# Patient Record
Sex: Female | Born: 1990 | Race: Black or African American | Hispanic: No | Marital: Single | Smoking: Never smoker
Health system: Southern US, Community
[De-identification: ages and names within clinical notes are randomized; demographics above are authoritative.]

## PROBLEM LIST (undated history)

## (undated) DIAGNOSIS — M1990 Unspecified osteoarthritis, unspecified site: Secondary | ICD-10-CM

## (undated) HISTORY — PX: WISDOM TOOTH EXTRACTION: SHX21

## (undated) HISTORY — PX: KNEE ARTHROSCOPY: SHX127

## (undated) HISTORY — PX: LEG SURGERY: SHX1003

## (undated) HISTORY — PX: KNEE CARTILAGE SURGERY: SHX688

---

## 2014-02-13 HISTORY — PX: DILATION AND CURETTAGE OF UTERUS: SHX78

## 2018-03-05 DIAGNOSIS — Z01419 Encounter for gynecological examination (general) (routine) without abnormal findings: Secondary | ICD-10-CM | POA: Diagnosis not present

## 2018-03-05 DIAGNOSIS — Z3040 Encounter for surveillance of contraceptives, unspecified: Secondary | ICD-10-CM | POA: Diagnosis not present

## 2018-03-05 DIAGNOSIS — Z113 Encounter for screening for infections with a predominantly sexual mode of transmission: Secondary | ICD-10-CM | POA: Diagnosis not present

## 2018-03-05 DIAGNOSIS — Z124 Encounter for screening for malignant neoplasm of cervix: Secondary | ICD-10-CM | POA: Diagnosis not present

## 2018-03-12 DIAGNOSIS — A6000 Herpesviral infection of urogenital system, unspecified: Secondary | ICD-10-CM | POA: Diagnosis not present

## 2018-04-08 DIAGNOSIS — H6062 Unspecified chronic otitis externa, left ear: Secondary | ICD-10-CM | POA: Diagnosis not present

## 2018-04-08 DIAGNOSIS — H6122 Impacted cerumen, left ear: Secondary | ICD-10-CM | POA: Diagnosis not present

## 2018-05-14 ENCOUNTER — Ambulatory Visit (HOSPITAL_COMMUNITY)
Admission: EM | Admit: 2018-05-14 | Discharge: 2018-05-14 | Disposition: A | Discharge status: Home / Self Care | Payer: BLUE CROSS/BLUE SHIELD | Attending: Family Medicine | Admitting: Family Medicine

## 2018-05-14 ENCOUNTER — Ambulatory Visit (INDEPENDENT_AMBULATORY_CARE_PROVIDER_SITE_OTHER): Payer: BLUE CROSS/BLUE SHIELD

## 2018-05-14 ENCOUNTER — Encounter (HOSPITAL_COMMUNITY): Payer: Self-pay | Admitting: Emergency Medicine

## 2018-05-14 DIAGNOSIS — R062 Wheezing: Secondary | ICD-10-CM | POA: Diagnosis not present

## 2018-05-14 DIAGNOSIS — R0602 Shortness of breath: Secondary | ICD-10-CM

## 2018-05-14 DIAGNOSIS — J45909 Unspecified asthma, uncomplicated: Secondary | ICD-10-CM | POA: Diagnosis not present

## 2018-05-14 MED ORDER — ALBUTEROL SULFATE HFA 108 (90 BASE) MCG/ACT IN AERS
INHALATION_SPRAY | RESPIRATORY_TRACT | Status: AC
  Filled 2018-05-14: qty 6.7

## 2018-05-14 MED ORDER — AEROCHAMBER PLUS FLO-VU LARGE MISC
Status: AC
  Filled 2018-05-14: qty 1

## 2018-05-14 MED ORDER — ALBUTEROL SULFATE HFA 108 (90 BASE) MCG/ACT IN AERS
2.0000 | INHALATION_SPRAY | Freq: Once | RESPIRATORY_TRACT | Status: AC
  Administered 2018-05-14: 2 via RESPIRATORY_TRACT

## 2018-05-14 MED ORDER — ALBUTEROL SULFATE HFA 108 (90 BASE) MCG/ACT IN AERS: 1.0000 | INHALATION_SPRAY | Freq: Four times a day (QID) | RESPIRATORY_TRACT | 0 refills | Status: DC | PRN

## 2018-05-14 MED ORDER — CETIRIZINE HCL 10 MG PO TABS: 10.0000 mg | ORAL_TABLET | Freq: Every day | ORAL | 0 refills | Status: DC

## 2018-05-14 NOTE — Discharge Instructions (Addendum)
Drink plenty of fluids. Take an antihistamine like zyrtec daily during the allergy season Use the albuterol every 4 hours as needed for wheezing/shortness of breath Follow up as needed, if you fail to improve

## 2018-05-14 NOTE — ED Provider Notes (Signed)
[facility]    CSN: [id number] Arrival date & time: 05/14/18  1307     History   Chief Complaint Chief Complaint  Patient presents with  . Shortness of Breath    HPI Jodi Roberts is a 28 y.o. female.   HPI Patient is here for evaluation of shortness of breath. She has known seasonal allergies and these started a day or 2 ago.  Sneezing, runny nose, itchy watery eyes Last night she developed shortness of breath.  She feels like she cannot take a deep breath.  She has never had asthma before.  Asthma does run in her family. She has not had any fever or chills.  She is not had any cough.  She has not had any exposure to sick individuals.  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  She states she is working to keep a safe distance from customers. History reviewed. No pertinent past medical history.  There are no active problems to display for this patient.   Past Surgical History:  Procedure Laterality Date  . LEG SURGERY      OB History   No obstetric history on file.      Home Medications    Prior to Admission medications   Medication Sig Start Date End Date Taking? Authorizing Provider  albuterol (PROVENTIL HFA;VENTOLIN HFA) 108 (90 Base) MCG/ACT inhaler Inhale 1-2 puffs into the lungs every 6 (six) hours as needed for wheezing or shortness of breath. 05/14/18   Eustace Moore, MD  cetirizine (ZYRTEC) 10 MG tablet Take 1 tablet (10 mg total) by mouth daily. 05/14/18   Eustace Moore, MD    Family History Family History  Family history unknown: Yes  Family history of asthma per patient  Social History Social History   Tobacco Use  . Smoking status: Never Smoker  Substance Use Topics  . Alcohol use: Never    Frequency: Never  . Drug use: Never     Allergies   Shellfish allergy   Review of Systems Review of Systems  Constitutional: Negative for chills and fever.  HENT:  Positive for postnasal drip, rhinorrhea and sneezing. Negative for ear pain and sore throat.   Eyes: Negative for pain and visual disturbance.  Respiratory: Positive for shortness of breath. Negative for cough.   Cardiovascular: Negative for chest pain and palpitations.  Gastrointestinal: Negative for abdominal pain and vomiting.  Genitourinary: Negative for dysuria and hematuria.  Musculoskeletal: Negative for arthralgias and back pain.  Skin: Negative for color change and rash.  Neurological: Negative for seizures and syncope.  All other systems reviewed and are negative.    Physical Exam Triage Vital Signs ED Triage Vitals  Enc Vitals Group     BP 05/14/18 1328 (!) 155/95     Pulse Rate 05/14/18 1328 93     Resp 05/14/18 1328 (!) 30     Temp 05/14/18 1328 97.7 F (36.5 C)     Temp src --      SpO2 05/14/18 1328 100 %     Weight --      Height --      Head Circumference --      Peak Flow --      Pain Score 05/14/18 1329 0     Pain Loc --      Pain Edu? --      Excl. in GC? --    No data found.  Updated Vital Signs BP (!) 155/95   Pulse 93   Temp 97.7 F (36.5 C)   Resp (!) 30   LMP 05/13/2018   SpO2 100%      Physical Exam Constitutional:      General: She is not in acute distress.    Appearance: She is well-developed. She is obese.  HENT:     Head: Normocephalic and atraumatic.  Eyes:     Conjunctiva/sclera: Conjunctivae normal.     Pupils: Pupils are equal, round, and reactive to light.  Neck:     Musculoskeletal: Normal range of motion.  Cardiovascular:     Rate and Rhythm: Normal rate and regular rhythm.     Heart sounds: Normal heart sounds.  Pulmonary:     Effort: Pulmonary effort is normal. Tachypnea present. No respiratory distress.     Breath sounds: Examination of the right-upper field reveals wheezing. Examination of the left-upper field reveals wheezing. Examination of the right-middle field reveals wheezing. Examination of the left-middle  field reveals wheezing. Examination of the left-lower field reveals wheezing. Wheezing present.  Chest:     Chest wall: No tenderness.  Abdominal:     General: There is no distension.     Palpations: Abdomen is soft.  Musculoskeletal: Normal range of motion.     Right lower leg: She exhibits no tenderness. No edema.     Left lower leg: She exhibits no tenderness. No edema.  Lymphadenopathy:     Cervical: No cervical adenopathy.  Skin:    General: Skin is warm and dry.  Neurological:     General: No focal deficit present.     Mental Status: She is alert.  Psychiatric:        Mood and Affect: Mood normal.        Behavior: Behavior normal.   after albuterol inhaler the wheezing was resolved   UC Treatments / Results  Labs (all labs ordered are listed, but only abnormal results are displayed) Labs Reviewed - No data to display  EKG None  Radiology Dg Chest 2 View  Result Date: 05/14/2018 CLINICAL DATA:  Shortness of breath EXAM: CHEST - 2 VIEW COMPARISON:  None. FINDINGS: Heart and mediastinal contours are within normal limits. No focal opacities or effusions. No acute bony abnormality. IMPRESSION: No active cardiopulmonary disease. Electronically Signed   By: Charlett Nose M.D.   On: 05/14/2018 14:05    Procedures Procedures (including critical care time)  Medications Ordered in UC Medications  albuterol (PROVENTIL HFA;VENTOLIN HFA) 108 (90 Base) MCG/ACT inhaler 2 puff (2 puffs Inhalation Given 05/14/18 1353)    Initial Impression / Assessment and Plan / UC Course  I have reviewed the triage vital signs and the nursing notes.  Pertinent labs & imaging results that were available during my care of the patient were reviewed by me and considered in my medical decision making (see chart for details).     Discussed wheezing reaction to allergies/ diagnosis of asthma only if it recurrs Final Clinical Impressions(s) / UC Diagnoses   Final diagnoses:  Wheezing  Asthma due  to seasonal allergies     Discharge Instructions     Drink plenty of fluids. Take an antihistamine like zyrtec daily during the allergy season Use the albuterol every 4 hours as needed for wheezing/shortness of breath Follow up as needed, if you fail to improve    ED Prescriptions    Medication Sig Dispense Auth. Provider   albuterol (PROVENTIL HFA;VENTOLIN HFA) 108 (90 Base)  MCG/ACT inhaler Inhale 1-2 puffs into the lungs every 6 (six) hours as needed for wheezing or shortness of breath. 1 Inhaler Eustace Moore, MD   cetirizine (ZYRTEC) 10 MG tablet Take 1 tablet (10 mg total) by mouth daily. 30 tablet Eustace Moore, MD     Controlled Substance Prescriptions [place] Controlled Substance Registry consulted? Not Applicable   Eustace Moore, MD 05/14/18 [phone]

## 2018-05-14 NOTE — ED Triage Notes (Signed)
Pt c/o sob x2 days, states she has bad allergies, no hx of asthma personally but runs in her family, states its been making her feel sob.

## 2018-10-16 ENCOUNTER — Ambulatory Visit: Payer: Self-pay

## 2018-10-16 ENCOUNTER — Other Ambulatory Visit: Payer: Self-pay

## 2018-10-16 ENCOUNTER — Encounter: Payer: Self-pay | Admitting: Orthopaedic Surgery

## 2018-10-16 ENCOUNTER — Ambulatory Visit (INDEPENDENT_AMBULATORY_CARE_PROVIDER_SITE_OTHER): Payer: BC Managed Care – PPO | Admitting: Orthopaedic Surgery

## 2018-10-16 DIAGNOSIS — M25562 Pain in left knee: Secondary | ICD-10-CM | POA: Diagnosis not present

## 2018-10-16 DIAGNOSIS — M1712 Unilateral primary osteoarthritis, left knee: Secondary | ICD-10-CM | POA: Diagnosis not present

## 2018-10-16 DIAGNOSIS — G8929 Other chronic pain: Secondary | ICD-10-CM

## 2018-10-16 MED ORDER — METHYLPREDNISOLONE ACETATE 40 MG/ML IJ SUSP
40.0000 mg | INTRAMUSCULAR | Status: AC | PRN
  Administered 2018-10-16: 16:00:00 40 mg via INTRA_ARTICULAR

## 2018-10-16 MED ORDER — LIDOCAINE HCL 1 % IJ SOLN
2.0000 mL | INTRAMUSCULAR | Status: AC | PRN
  Administered 2018-10-16: 2 mL

## 2018-10-16 MED ORDER — BUPIVACAINE HCL 0.25 % IJ SOLN
2.0000 mL | INTRAMUSCULAR | Status: AC | PRN
  Administered 2018-10-16: 2 mL via INTRA_ARTICULAR
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
## 2018-10-24 ENCOUNTER — Telehealth: Payer: Self-pay | Admitting: Radiology

## 2018-10-24 DIAGNOSIS — M1712 Unilateral primary osteoarthritis, left knee: Secondary | ICD-10-CM | POA: Insufficient documentation

## 2018-10-24 NOTE — Telephone Encounter (Signed)
Printed info and put dx on OV note for [place] brace from [place]

## 2018-11-05 ENCOUNTER — Other Ambulatory Visit: Payer: Self-pay

## 2018-11-05 ENCOUNTER — Ambulatory Visit (INDEPENDENT_AMBULATORY_CARE_PROVIDER_SITE_OTHER): Payer: BC Managed Care – PPO | Admitting: Orthopaedic Surgery

## 2018-11-05 DIAGNOSIS — M1732 Unilateral post-traumatic osteoarthritis, left knee: Secondary | ICD-10-CM
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
## 2018-11-06 DIAGNOSIS — N760 Acute vaginitis: Secondary | ICD-10-CM | POA: Diagnosis not present

## 2018-11-06 DIAGNOSIS — Z113 Encounter for screening for infections with a predominantly sexual mode of transmission: Secondary | ICD-10-CM | POA: Diagnosis not present

## 2018-11-06 DIAGNOSIS — Z6836 Body mass index (BMI) 36.0-36.9, adult: Secondary | ICD-10-CM | POA: Diagnosis not present

## 2018-11-06 DIAGNOSIS — Z01419 Encounter for gynecological examination (general) (routine) without abnormal findings: Secondary | ICD-10-CM | POA: Diagnosis not present

## 2018-11-06 LAB — HM PAP SMEAR: HM Pap smear: NEGATIVE

## 2018-11-28 ENCOUNTER — Other Ambulatory Visit: Payer: Self-pay

## 2018-12-02 NOTE — Progress Notes (Signed)
[id number] [place], [place] - [address] [place] [address] Phone: [phone] Fax: [phone]      Your procedure is scheduled on October 26  Report to [facility] Main Entrance "A" at 0530 A.M., and check in at the Admitting office.  Call this number if you have problems the morning of surgery:  [phone]  Call [phone] if you have any questions prior to your surgery date Monday-Friday 8am-4pm    Remember:  Do not eat  after midnight the night before your surgery  You may drink clear liquids until 0430 am  the morning of your surgery.   Clear liquids allowed are: Water, Non-Citrus Juices (without pulp), Carbonated Beverages, Clear Tea, Black Coffee Only, and Gatorade   Enhanced Recovery after Surgery for Orthopedics Enhanced Recovery after Surgery is a protocol used to improve the stress on your body and your recovery after surgery.  Patient Instructions  . The night before surgery:  o No food after midnight. ONLY clear liquids after midnight  .  Marland Kitchen The day of surgery (if you do NOT have diabetes):  o Drink ONE (1) Pre-Surgery Clear Ensure as directed.   o This drink was given to you during your hospital  pre-op appointment visit. o The pre-op nurse will instruct you on the time to drink the  Pre-Surgery Ensure depending on your surgery time. Drink by 0430 am the morning of your surgery o Finish the drink at the designated time by the pre-op nurse.  o Nothing else to drink after completing the  Pre-Surgery Clear Ensure.         If you have questions, please contact your surgeon's office.      Take these medicines the morning of surgery with A SIP OF WATER  cetirizine (ZYRTEC) albuterol (PROVENTIL HFA;VENTOLIN HFA) if needed, Please bring all inhalers with you the day of surgery.   7 days prior to surgery STOP taking any Aspirin (unless otherwise instructed by your surgeon), Aleve, Naproxen, Ibuprofen, Motrin, Advil,  Goody's, BC's, all herbal medications, fish oil, and all vitamins.    The Morning of Surgery  Do not wear jewelry, make-up or nail polish.  Do not wear lotions, powders, or perfumes/colognes, or deodorant  Do not shave 48 hours prior to surgery.    Do not bring valuables to the hospital.  [facility] is not responsible for any belongings or valuables.  If you are a smoker, DO NOT Smoke 24 hours prior to surgery IF you wear a CPAP at night please bring your mask, tubing, and machine the morning of surgery   Remember that you must have someone to transport you home after your surgery, and remain with you for 24 hours if you are discharged the same day.   Contacts, glasses, hearing aids, dentures or bridgework may not be worn into surgery.    Leave your suitcase in the car.  After surgery it may be brought to your room.  For patients admitted to the hospital, discharge time will be determined by your treatment team.  Patients discharged the day of surgery will not be allowed to drive home.    Special instructions:   [facility]- Preparing For Surgery  Before surgery, you can play an important role. Because skin is not sterile, your skin needs to be as free of germs as possible. You can reduce the number of germs on your skin by washing with CHG (chlorahexidine gluconate) Soap before surgery.  CHG  is an antiseptic cleaner which kills germs and bonds with the skin to continue killing germs even after washing.    Oral Hygiene is also important to reduce your risk of infection.  Remember - BRUSH YOUR TEETH THE MORNING OF SURGERY WITH YOUR REGULAR TOOTHPASTE  Please do not use if you have an allergy to CHG or antibacterial soaps. If your skin becomes reddened/irritated stop using the CHG.  Do not shave (including legs and underarms) for at least 48 hours prior to first CHG shower. It is OK to shave your face.  Please follow these instructions carefully.   1. Shower the NIGHT BEFORE  SURGERY and the MORNING OF SURGERY with CHG Soap.   2. If you chose to wash your hair, wash your hair first as usual with your normal shampoo.  3. After you shampoo, rinse your hair and body thoroughly to remove the shampoo.  4. Use CHG as you would any other liquid soap. You can apply CHG directly to the skin and wash gently with a scrungie or a clean washcloth.   5. Apply the CHG Soap to your body ONLY FROM THE NECK DOWN.  Do not use on open wounds or open sores. Avoid contact with your eyes, ears, mouth and genitals (private parts). Wash Face and genitals (private parts)  with your normal soap.   6. Wash thoroughly, paying special attention to the area where your surgery will be performed.  7. Thoroughly rinse your body with warm water from the neck down.  8. DO NOT shower/wash with your normal soap after using and rinsing off the CHG Soap.  9. Pat yourself dry with a CLEAN TOWEL.  10. Wear CLEAN PAJAMAS to bed the night before surgery, wear comfortable clothes the morning of surgery  11. Place CLEAN SHEETS on your bed the night of your first shower and DO NOT SLEEP WITH PETS.    Day of Surgery:  Do not apply any deodorants/lotions. Please shower the morning of surgery with the CHG soap  Please wear clean clothes to the hospital/surgery center.   Remember to brush your teeth WITH YOUR REGULAR TOOTHPASTE.   Please read over the following fact sheets that you were given.

## 2018-12-03 ENCOUNTER — Other Ambulatory Visit: Payer: Self-pay

## 2018-12-03 ENCOUNTER — Encounter (HOSPITAL_COMMUNITY)
Admission: RE | Admit: 2018-12-03 | Discharge: 2018-12-03 | Disposition: A | Discharge status: Home / Self Care | Payer: BC Managed Care – PPO | Source: Ambulatory Visit | Attending: Orthopaedic Surgery | Admitting: Orthopaedic Surgery

## 2018-12-03 ENCOUNTER — Encounter (HOSPITAL_COMMUNITY): Payer: Self-pay

## 2018-12-03 ENCOUNTER — Other Ambulatory Visit: Payer: Self-pay | Admitting: Physician Assistant

## 2018-12-03 ENCOUNTER — Ambulatory Visit (HOSPITAL_COMMUNITY)
Admission: RE | Admit: 2018-12-03 | Discharge: 2018-12-03 | Disposition: A | Discharge status: Home / Self Care | Payer: BC Managed Care – PPO | Source: Ambulatory Visit | Attending: Physician Assistant | Admitting: Physician Assistant

## 2018-12-03 DIAGNOSIS — M1712 Unilateral primary osteoarthritis, left knee: Secondary | ICD-10-CM

## 2018-12-03 DIAGNOSIS — Z01818 Encounter for other preprocedural examination: Secondary | ICD-10-CM | POA: Diagnosis not present

## 2018-12-03 HISTORY — DX: Unspecified osteoarthritis, unspecified site: M19.90

## 2018-12-03 LAB — COMPREHENSIVE METABOLIC PANEL
ALT: 18 U/L (ref 0–44)
AST: 20 U/L (ref 15–41)
Albumin: 3.7 g/dL (ref 3.5–5.0)
Alkaline Phosphatase: 73 U/L (ref 38–126)
Anion gap: 10 (ref 5–15)
BUN: 13 mg/dL (ref 6–20)
CO2: 22 mmol/L (ref 22–32)
Calcium: 8.9 mg/dL (ref 8.9–10.3)
Chloride: 107 mmol/L (ref 98–111)
Creatinine, Ser: 0.73 mg/dL (ref 0.44–1.00)
GFR calc Af Amer: >60 mL/min (ref >60)
GFR calc non Af Amer: >60 mL/min (ref >60)
Glucose, Bld: 99 mg/dL (ref 70–99)
Potassium: 3.3 mmol/L — ABNORMAL LOW (ref 3.5–5.1)
Sodium: 139 mmol/L (ref 135–145)
Total Bilirubin: 0.4 mg/dL (ref 0.3–1.2)
Total Protein: 8.4 g/dL — ABNORMAL HIGH (ref 6.5–8.1)

## 2018-12-03 LAB — TYPE AND SCREEN
ABO/RH(D): O POS
Antibody Screen: NEGATIVE

## 2018-12-03 LAB — CBC WITH DIFFERENTIAL/PLATELET
Abs Immature Granulocytes: 0.03 10*3/uL (ref 0.00–0.07)
Basophils Absolute: 0 10*3/uL (ref 0.0–0.1)
Basophils Relative: 1 %
Eosinophils Absolute: 0.1 10*3/uL (ref 0.0–0.5)
Eosinophils Relative: 2 %
HCT: 36.1 % (ref 36.0–46.0)
Hemoglobin: 11.1 g/dL — ABNORMAL LOW (ref 12.0–15.0)
Immature Granulocytes: 0 %
Lymphocytes Relative: 28 %
Lymphs Abs: 2 10*3/uL (ref 0.7–4.0)
MCH: 28.8 pg (ref 26.0–34.0)
MCHC: 30.7 g/dL (ref 30.0–36.0)
MCV: 93.5 fL (ref 80.0–100.0)
Monocytes Absolute: 0.5 10*3/uL (ref 0.1–1.0)
Monocytes Relative: 8 %
Neutro Abs: 4.3 10*3/uL (ref 1.7–7.7)
Neutrophils Relative %: 61 %
Platelets: 274 10*3/uL (ref 150–400)
RBC: 3.86 MIL/uL — ABNORMAL LOW (ref 3.87–5.11)
RDW: 14.3 % (ref 11.5–15.5)
WBC: 7.1 10*3/uL (ref 4.0–10.5)
nRBC: 0 % (ref 0.0–0.2)

## 2018-12-03 LAB — PROTIME-INR
INR: 1.1 (ref 0.8–1.2)
Prothrombin Time: 14.2 seconds (ref 11.4–15.2)

## 2018-12-03 LAB — SURGICAL PCR SCREEN
MRSA, PCR: NEGATIVE
Staphylococcus aureus: NEGATIVE

## 2018-12-03 LAB — APTT: aPTT: 36 seconds (ref 24–36)

## 2018-12-03 LAB — ABO/RH: ABO/RH(D): O POS

## 2018-12-03 MED ORDER — POTASSIUM CHLORIDE ER 10 MEQ PO TBCR: EXTENDED_RELEASE_TABLET | ORAL | 0 refills | Status: DC

## 2018-12-03 MED ORDER — POTASSIUM CHLORIDE ER 10 MEQ PO TBCR: 10.0000 meq | EXTENDED_RELEASE_TABLET | Freq: Every day | ORAL | 0 refills | Status: DC

## 2018-12-03 NOTE — Progress Notes (Signed)
Will you let patient know that her potassium was a little low and that I have called in some medicine to take for two days

## 2018-12-03 NOTE — Progress Notes (Signed)
PCP - denies Cardiologist -denies    Chest x-ray - 12/03/18 per Dr Erlinda Hong EKG - 12/03/18 - per Dr Erlinda Hong Stress Test - denies ECHO - denies Cardiac Cath - denies  ERAS Protcol -yes clears until 0430 am PRE-SURGERY Ensure or G2- ensure given  COVID TEST- 12/05/18   Anesthesia review: NO  Patient denies shortness of breath, fever, cough and chest pain at PAT appointment   All instructions explained to the patient, with a verbal understanding of the material. Patient agrees to go over the instructions while at home for a better understanding. Patient also instructed to self quarantine after being tested for COVID-19. The opportunity to ask questions was provided.

## 2018-12-05 ENCOUNTER — Other Ambulatory Visit (HOSPITAL_COMMUNITY)
Admission: RE | Admit: 2018-12-05 | Discharge: 2018-12-05 | Disposition: A | Discharge status: Home / Self Care | Payer: BC Managed Care – PPO | Source: Ambulatory Visit | Attending: Orthopaedic Surgery | Admitting: Orthopaedic Surgery

## 2018-12-05 DIAGNOSIS — Z01812 Encounter for preprocedural laboratory examination: Secondary | ICD-10-CM | POA: Insufficient documentation

## 2018-12-05 DIAGNOSIS — Z20828 Contact with and (suspected) exposure to other viral communicable diseases: Secondary | ICD-10-CM | POA: Insufficient documentation

## 2018-12-06 MED ORDER — TRANEXAMIC ACID 1000 MG/10ML IV SOLN
2000.0000 mg | INTRAVENOUS | Status: DC
  Filled 2018-12-06: qty 20

## 2018-12-06 MED ORDER — BUPIVACAINE LIPOSOME 1.3 % IJ SUSP
20.0000 mL | Freq: Once | INTRAMUSCULAR | Status: DC
  Filled 2018-12-06: qty 20

## 2018-12-08 LAB — NOVEL CORONAVIRUS, NAA (HOSP ORDER, SEND-OUT TO REF LAB; TAT 18-24 HRS): SARS-CoV-2, NAA: NOT DETECTED

## 2018-12-08 NOTE — Anesthesia Preprocedure Evaluation (Addendum)
Anesthesia Evaluation  Patient identified by MRN, date of birth, ID band Patient awake    Reviewed: Allergy & Precautions, NPO status , Patient's Chart, lab work & pertinent test results  Airway Mallampati: II  TM Distance: >3 FB Neck ROM: Full    Dental no notable dental hx. (+) Teeth Intact, Implants, Dental Advisory Given   Pulmonary asthma ,    Pulmonary exam normal breath sounds clear to auscultation       Cardiovascular negative cardio ROS Normal cardiovascular exam Rhythm:Regular Rate:Normal     Neuro/Psych negative neurological ROS  negative psych ROS   GI/Hepatic negative GI ROS, Neg liver ROS,   Endo/Other  Obesity BMI 37  Renal/GU negative Renal ROS  negative genitourinary   Musculoskeletal  (+) Arthritis , Osteoarthritis,    Abdominal Normal abdominal exam  (+)   Peds negative pediatric ROS (+)  Hematology negative hematology ROS (+)   Anesthesia Other Findings   Reproductive/Obstetrics negative OB ROS                            Anesthesia Physical Anesthesia Plan  ASA: II  Anesthesia Plan: Spinal and Regional   Post-op Pain Management:  Regional for Post-op pain   Induction:   PONV Risk Score and Plan: 2 and Propofol infusion and TIVA  Airway Management Planned: Natural Airway and Nasal Cannula  Additional Equipment: None  Intra-op Plan:   Post-operative Plan:   Informed Consent: I have reviewed the patients History and Physical, chart, labs and discussed the procedure including the risks, benefits and alternatives for the proposed anesthesia with the patient or authorized representative who has indicated his/her understanding and acceptance.       Plan Discussed with: CRNA  Anesthesia Plan Comments:         Anesthesia Quick Evaluation

## 2018-12-09 ENCOUNTER — Inpatient Hospital Stay (HOSPITAL_COMMUNITY): Payer: BC Managed Care – PPO | Admitting: Anesthesiology

## 2018-12-09 ENCOUNTER — Observation Stay (HOSPITAL_COMMUNITY): Payer: BC Managed Care – PPO

## 2018-12-09 ENCOUNTER — Other Ambulatory Visit: Payer: Self-pay

## 2018-12-09 ENCOUNTER — Encounter (HOSPITAL_COMMUNITY): Payer: Self-pay

## 2018-12-09 ENCOUNTER — Inpatient Hospital Stay (HOSPITAL_COMMUNITY)
Admission: RE | Admit: 2018-12-09 | Discharge: 2018-12-10 | DRG: 470 | Disposition: A | Discharge status: Home Health | Payer: BC Managed Care – PPO | Attending: Orthopaedic Surgery | Admitting: Orthopaedic Surgery

## 2018-12-09 ENCOUNTER — Encounter (HOSPITAL_COMMUNITY)
Admission: RE | Disposition: A | Discharge status: Home Health | Payer: Self-pay | Source: Home / Self Care | Attending: Orthopaedic Surgery

## 2018-12-09 DIAGNOSIS — Z6837 Body mass index (BMI) 37.0-37.9, adult: Secondary | ICD-10-CM

## 2018-12-09 DIAGNOSIS — M1712 Unilateral primary osteoarthritis, left knee: Principal | ICD-10-CM | POA: Diagnosis present

## 2018-12-09 DIAGNOSIS — Z96652 Presence of left artificial knee joint: Secondary | ICD-10-CM

## 2018-12-09 DIAGNOSIS — E669 Obesity, unspecified: Secondary | ICD-10-CM | POA: Diagnosis present

## 2018-12-09 DIAGNOSIS — Z79899 Other long term (current) drug therapy: Secondary | ICD-10-CM | POA: Diagnosis not present

## 2018-12-09 DIAGNOSIS — J45909 Unspecified asthma, uncomplicated: Secondary | ICD-10-CM | POA: Diagnosis not present

## 2018-12-09 DIAGNOSIS — G8918 Other acute postprocedural pain: Secondary | ICD-10-CM | POA: Diagnosis not present

## 2018-12-09 DIAGNOSIS — Z91013 Allergy to seafood: Secondary | ICD-10-CM

## 2018-12-09 DIAGNOSIS — Z471 Aftercare following joint replacement surgery: Secondary | ICD-10-CM | POA: Diagnosis not present

## 2018-12-09 HISTORY — PX: TOTAL KNEE ARTHROPLASTY: SHX125

## 2018-12-09 LAB — POCT PREGNANCY, URINE: Preg Test, Ur: NEGATIVE

## 2018-12-09 SURGERY — ARTHROPLASTY, KNEE, TOTAL
Anesthesia: Regional | Site: Knee | Laterality: Left

## 2018-12-09 MED ORDER — ONDANSETRON HCL 4 MG/2ML IJ SOLN: 4.0000 mg | Freq: Four times a day (QID) | INTRAMUSCULAR | Status: DC | PRN

## 2018-12-09 MED ORDER — METOCLOPRAMIDE HCL 5 MG PO TABS: 5.0000 mg | ORAL_TABLET | Freq: Three times a day (TID) | ORAL | Status: DC | PRN

## 2018-12-09 MED ORDER — POLYETHYLENE GLYCOL 3350 17 G PO PACK: 17.0000 g | PACK | Freq: Every day | ORAL | Status: DC | PRN

## 2018-12-09 MED ORDER — LIDOCAINE 2% (20 MG/ML) 5 ML SYRINGE
INTRAMUSCULAR | Status: AC
  Filled 2018-12-09: qty 5

## 2018-12-09 MED ORDER — DEXAMETHASONE SODIUM PHOSPHATE 10 MG/ML IJ SOLN
INTRAMUSCULAR | Status: DC | PRN
  Administered 2018-12-09: 10 mg

## 2018-12-09 MED ORDER — PROPOFOL 10 MG/ML IV BOLUS
INTRAVENOUS | Status: AC
  Filled 2018-12-09: qty 20

## 2018-12-09 MED ORDER — ONDANSETRON HCL 4 MG PO TABS: 4.0000 mg | ORAL_TABLET | Freq: Three times a day (TID) | ORAL | 0 refills | Status: DC | PRN

## 2018-12-09 MED ORDER — CELECOXIB 200 MG PO CAPS
200.0000 mg | ORAL_CAPSULE | Freq: Two times a day (BID) | ORAL | Status: DC
  Administered 2018-12-09 – 2018-12-10 (×3): 200 mg via ORAL
  Filled 2018-12-09 (×3): qty 1

## 2018-12-09 MED ORDER — VANCOMYCIN HCL 1000 MG IV SOLR
INTRAVENOUS | Status: AC
  Filled 2018-12-09: qty 1000

## 2018-12-09 MED ORDER — OXYCODONE HCL 5 MG PO TABS: 10.0000 mg | ORAL_TABLET | ORAL | Status: DC | PRN

## 2018-12-09 MED ORDER — VANCOMYCIN HCL 1000 MG IV SOLR
INTRAVENOUS | Status: DC | PRN
  Administered 2018-12-09: 1000 mg via TOPICAL

## 2018-12-09 MED ORDER — ACETAMINOPHEN 500 MG PO TABS
1000.0000 mg | ORAL_TABLET | Freq: Once | ORAL | Status: AC
  Administered 2018-12-09: 06:00:00 1000 mg via ORAL

## 2018-12-09 MED ORDER — EPINEPHRINE PF 1 MG/ML IJ SOLN
INTRAMUSCULAR | Status: DC | PRN
  Administered 2018-12-09: .15 mL

## 2018-12-09 MED ORDER — ACETAMINOPHEN 500 MG PO TABS
ORAL_TABLET | ORAL | Status: AC
  Administered 2018-12-09: 1000 mg via ORAL
  Filled 2018-12-09: qty 2

## 2018-12-09 MED ORDER — ALBUTEROL SULFATE (2.5 MG/3ML) 0.083% IN NEBU: 3.0000 mL | INHALATION_SOLUTION | Freq: Four times a day (QID) | RESPIRATORY_TRACT | Status: DC | PRN

## 2018-12-09 MED ORDER — ACETAMINOPHEN 500 MG PO TABS
1000.0000 mg | ORAL_TABLET | Freq: Four times a day (QID) | ORAL | Status: AC
  Administered 2018-12-09 – 2018-12-10 (×4): 1000 mg via ORAL
  Filled 2018-12-09 (×4): qty 2

## 2018-12-09 MED ORDER — CEFAZOLIN SODIUM-DEXTROSE 2-4 GM/100ML-% IV SOLN
2.0000 g | INTRAVENOUS | Status: AC
  Administered 2018-12-09: 08:00:00 2 g via INTRAVENOUS

## 2018-12-09 MED ORDER — DIPHENHYDRAMINE HCL 12.5 MG/5ML PO ELIX: 25.0000 mg | ORAL_SOLUTION | ORAL | Status: DC | PRN

## 2018-12-09 MED ORDER — ONDANSETRON HCL 4 MG PO TABS: 4.0000 mg | ORAL_TABLET | Freq: Four times a day (QID) | ORAL | Status: DC | PRN

## 2018-12-09 MED ORDER — OXYCODONE HCL 5 MG PO TABS: 5.0000 mg | ORAL_TABLET | Freq: Once | ORAL | Status: DC | PRN

## 2018-12-09 MED ORDER — OXYCODONE HCL 5 MG PO TABS: 5.0000 mg | ORAL_TABLET | Freq: Three times a day (TID) | ORAL | 0 refills | Status: DC | PRN

## 2018-12-09 MED ORDER — CEFAZOLIN SODIUM-DEXTROSE 2-4 GM/100ML-% IV SOLN
2.0000 g | Freq: Four times a day (QID) | INTRAVENOUS | Status: AC
  Administered 2018-12-09 (×3): 2 g via INTRAVENOUS
  Filled 2018-12-09 (×3): qty 100

## 2018-12-09 MED ORDER — BUPIVACAINE HCL (PF) 0.25 % IJ SOLN
INTRAMUSCULAR | Status: DC | PRN
  Administered 2018-12-09: 20 mL

## 2018-12-09 MED ORDER — CHLORHEXIDINE GLUCONATE 4 % EX LIQD: 60.0000 mL | Freq: Once | CUTANEOUS | Status: DC

## 2018-12-09 MED ORDER — SODIUM CHLORIDE 0.9 % IR SOLN
Status: DC | PRN
  Administered 2018-12-09: 3000 mL

## 2018-12-09 MED ORDER — MIDAZOLAM HCL 2 MG/2ML IJ SOLN
INTRAMUSCULAR | Status: AC
  Filled 2018-12-09: qty 2

## 2018-12-09 MED ORDER — PROMETHAZINE HCL 25 MG/ML IJ SOLN: 6.2500 mg | INTRAMUSCULAR | Status: DC | PRN

## 2018-12-09 MED ORDER — POVIDONE-IODINE 10 % EX SWAB: 2.0000 "application " | Freq: Once | CUTANEOUS | Status: DC

## 2018-12-09 MED ORDER — PHENYLEPHRINE HCL (PRESSORS) 10 MG/ML IV SOLN
INTRAVENOUS | Status: DC | PRN
  Administered 2018-12-09 (×2): 80 ug via INTRAVENOUS

## 2018-12-09 MED ORDER — ASPIRIN EC 81 MG PO TBEC: 81.0000 mg | DELAYED_RELEASE_TABLET | Freq: Two times a day (BID) | ORAL | 0 refills | Status: DC

## 2018-12-09 MED ORDER — EPINEPHRINE PF 1 MG/ML IJ SOLN
INTRAMUSCULAR | Status: AC
  Filled 2018-12-09: qty 1

## 2018-12-09 MED ORDER — SODIUM CHLORIDE 0.9% FLUSH
INTRAVENOUS | Status: DC | PRN
  Administered 2018-12-09 (×2): 10 mL via INTRADERMAL

## 2018-12-09 MED ORDER — MEPERIDINE HCL 25 MG/ML IJ SOLN: 6.2500 mg | INTRAMUSCULAR | Status: DC | PRN

## 2018-12-09 MED ORDER — TRANEXAMIC ACID-NACL 1000-0.7 MG/100ML-% IV SOLN
INTRAVENOUS | Status: AC
  Filled 2018-12-09: qty 100

## 2018-12-09 MED ORDER — BUPIVACAINE HCL 0.5 % IJ SOLN
INTRAMUSCULAR | Status: DC | PRN
  Administered 2018-12-09: 20 mL

## 2018-12-09 MED ORDER — ONDANSETRON HCL 4 MG/2ML IJ SOLN
INTRAMUSCULAR | Status: DC | PRN
  Administered 2018-12-09: 4 mg via INTRAVENOUS

## 2018-12-09 MED ORDER — PROPOFOL 10 MG/ML IV BOLUS
INTRAVENOUS | Status: DC | PRN
  Administered 2018-12-09: 20 mg via INTRAVENOUS

## 2018-12-09 MED ORDER — ONDANSETRON HCL 4 MG/2ML IJ SOLN
INTRAMUSCULAR | Status: AC
  Filled 2018-12-09: qty 2

## 2018-12-09 MED ORDER — METHOCARBAMOL 1000 MG/10ML IJ SOLN
500.0000 mg | Freq: Four times a day (QID) | INTRAVENOUS | Status: DC | PRN
  Filled 2018-12-09: qty 5

## 2018-12-09 MED ORDER — PROPOFOL 500 MG/50ML IV EMUL
INTRAVENOUS | Status: DC | PRN
  Administered 2018-12-09: 75 ug/kg/min via INTRAVENOUS

## 2018-12-09 MED ORDER — 0.9 % SODIUM CHLORIDE (POUR BTL) OPTIME
TOPICAL | Status: DC | PRN
  Administered 2018-12-09: 1000 mL

## 2018-12-09 MED ORDER — TRANEXAMIC ACID-NACL 1000-0.7 MG/100ML-% IV SOLN
1000.0000 mg | INTRAVENOUS | Status: DC
  Filled 2018-12-09: qty 100

## 2018-12-09 MED ORDER — TRANEXAMIC ACID-NACL 1000-0.7 MG/100ML-% IV SOLN
1000.0000 mg | Freq: Once | INTRAVENOUS | Status: AC
  Administered 2018-12-09: 1000 mg via INTRAVENOUS
  Filled 2018-12-09: qty 100

## 2018-12-09 MED ORDER — METHOCARBAMOL 750 MG PO TABS: 750.0000 mg | ORAL_TABLET | Freq: Two times a day (BID) | ORAL | 0 refills | Status: DC | PRN

## 2018-12-09 MED ORDER — BUPIVACAINE HCL (PF) 0.25 % IJ SOLN
INTRAMUSCULAR | Status: AC
  Filled 2018-12-09: qty 30

## 2018-12-09 MED ORDER — DEXAMETHASONE SODIUM PHOSPHATE 10 MG/ML IJ SOLN
10.0000 mg | Freq: Once | INTRAMUSCULAR | Status: AC
  Administered 2018-12-10: 10 mg via INTRAVENOUS
  Filled 2018-12-09: qty 1

## 2018-12-09 MED ORDER — PROMETHAZINE HCL 25 MG PO TABS: 25.0000 mg | ORAL_TABLET | Freq: Four times a day (QID) | ORAL | 1 refills | Status: DC | PRN

## 2018-12-09 MED ORDER — BUPIVACAINE LIPOSOME 1.3 % IJ SUSP
INTRAMUSCULAR | Status: DC | PRN
  Administered 2018-12-09: 20 mL

## 2018-12-09 MED ORDER — KETOROLAC TROMETHAMINE 30 MG/ML IJ SOLN: 30.0000 mg | Freq: Once | INTRAMUSCULAR | Status: DC | PRN

## 2018-12-09 MED ORDER — DOCUSATE SODIUM 100 MG PO CAPS
100.0000 mg | ORAL_CAPSULE | Freq: Two times a day (BID) | ORAL | Status: DC
  Administered 2018-12-09 – 2018-12-10 (×2): 100 mg via ORAL
  Filled 2018-12-09 (×2): qty 1

## 2018-12-09 MED ORDER — SODIUM CHLORIDE 0.9 % IV SOLN
INTRAVENOUS | Status: DC
  Administered 2018-12-09 – 2018-12-10 (×2): via INTRAVENOUS

## 2018-12-09 MED ORDER — FENTANYL CITRATE (PF) 100 MCG/2ML IJ SOLN
INTRAMUSCULAR | Status: DC | PRN
  Administered 2018-12-09: 50 ug via INTRAVENOUS

## 2018-12-09 MED ORDER — METOCLOPRAMIDE HCL 5 MG/ML IJ SOLN: 5.0000 mg | Freq: Three times a day (TID) | INTRAMUSCULAR | Status: DC | PRN

## 2018-12-09 MED ORDER — KETOROLAC TROMETHAMINE 15 MG/ML IJ SOLN
30.0000 mg | Freq: Four times a day (QID) | INTRAMUSCULAR | Status: AC
  Administered 2018-12-09 – 2018-12-10 (×4): 30 mg via INTRAVENOUS
  Filled 2018-12-09 (×5): qty 2

## 2018-12-09 MED ORDER — SORBITOL 70 % SOLN: 30.0000 mL | Freq: Every day | Status: DC | PRN

## 2018-12-09 MED ORDER — ACETAMINOPHEN 325 MG PO TABS: 325.0000 mg | ORAL_TABLET | Freq: Four times a day (QID) | ORAL | Status: DC | PRN

## 2018-12-09 MED ORDER — OXYCODONE HCL 5 MG PO TABS: 5.0000 mg | ORAL_TABLET | ORAL | Status: DC | PRN

## 2018-12-09 MED ORDER — MENTHOL 3 MG MT LOZG: 1.0000 | LOZENGE | OROMUCOSAL | Status: DC | PRN

## 2018-12-09 MED ORDER — ASPIRIN 81 MG PO CHEW
81.0000 mg | CHEWABLE_TABLET | Freq: Two times a day (BID) | ORAL | Status: DC
  Administered 2018-12-09 – 2018-12-10 (×2): 81 mg via ORAL
  Filled 2018-12-09 (×2): qty 1

## 2018-12-09 MED ORDER — MAGNESIUM CITRATE PO SOLN: 1.0000 | Freq: Once | ORAL | Status: DC | PRN

## 2018-12-09 MED ORDER — PHENOL 1.4 % MT LIQD: 1.0000 | OROMUCOSAL | Status: DC | PRN

## 2018-12-09 MED ORDER — MIDAZOLAM HCL 2 MG/2ML IJ SOLN
INTRAMUSCULAR | Status: DC | PRN
  Administered 2018-12-09: 2 mg via INTRAVENOUS

## 2018-12-09 MED ORDER — FENTANYL CITRATE (PF) 250 MCG/5ML IJ SOLN
INTRAMUSCULAR | Status: AC
  Filled 2018-12-09: qty 5

## 2018-12-09 MED ORDER — CEFAZOLIN SODIUM-DEXTROSE 2-4 GM/100ML-% IV SOLN
INTRAVENOUS | Status: AC
  Filled 2018-12-09: qty 100

## 2018-12-09 MED ORDER — KETOROLAC TROMETHAMINE 10 MG PO TABS: 10.0000 mg | ORAL_TABLET | Freq: Two times a day (BID) | ORAL | 0 refills | Status: DC | PRN

## 2018-12-09 MED ORDER — DEXAMETHASONE SODIUM PHOSPHATE 4 MG/ML IJ SOLN
INTRAMUSCULAR | Status: DC | PRN
  Administered 2018-12-09: 5 mg via INTRAVENOUS

## 2018-12-09 MED ORDER — OXYCODONE HCL 5 MG/5ML PO SOLN: 5.0000 mg | Freq: Once | ORAL | Status: DC | PRN

## 2018-12-09 MED ORDER — BUPIVACAINE IN DEXTROSE 0.75-8.25 % IT SOLN
INTRATHECAL | Status: DC | PRN
  Administered 2018-12-09: 1.6 mL via INTRATHECAL

## 2018-12-09 MED ORDER — GABAPENTIN 300 MG PO CAPS
300.0000 mg | ORAL_CAPSULE | Freq: Three times a day (TID) | ORAL | Status: DC
  Administered 2018-12-09 – 2018-12-10 (×4): 300 mg via ORAL
  Filled 2018-12-09 (×4): qty 1

## 2018-12-09 MED ORDER — LACTATED RINGERS IV SOLN
INTRAVENOUS | Status: DC
  Administered 2018-12-09 (×2): via INTRAVENOUS

## 2018-12-09 MED ORDER — HYDROMORPHONE HCL 1 MG/ML IJ SOLN: 0.5000 mg | INTRAMUSCULAR | Status: DC | PRN

## 2018-12-09 MED ORDER — SENNOSIDES-DOCUSATE SODIUM 8.6-50 MG PO TABS: 1.0000 | ORAL_TABLET | Freq: Every evening | ORAL | 1 refills | Status: DC | PRN

## 2018-12-09 MED ORDER — DEXAMETHASONE SODIUM PHOSPHATE 10 MG/ML IJ SOLN
INTRAMUSCULAR | Status: AC
  Filled 2018-12-09: qty 1

## 2018-12-09 MED ORDER — HYDROMORPHONE HCL 1 MG/ML IJ SOLN: 0.2500 mg | INTRAMUSCULAR | Status: DC | PRN

## 2018-12-09 MED ORDER — OXYCODONE HCL ER 10 MG PO T12A
10.0000 mg | EXTENDED_RELEASE_TABLET | Freq: Two times a day (BID) | ORAL | Status: DC
  Administered 2018-12-09 – 2018-12-10 (×3): 10 mg via ORAL
  Filled 2018-12-09 (×3): qty 1

## 2018-12-09 MED ORDER — ALUM & MAG HYDROXIDE-SIMETH 200-200-20 MG/5ML PO SUSP: 30.0000 mL | ORAL | Status: DC | PRN

## 2018-12-09 MED ORDER — METHOCARBAMOL 500 MG PO TABS
500.0000 mg | ORAL_TABLET | Freq: Four times a day (QID) | ORAL | Status: DC | PRN
  Administered 2018-12-09: 500 mg via ORAL
  Filled 2018-12-09: qty 1

## 2018-12-09 MED ORDER — TRANEXAMIC ACID 1000 MG/10ML IV SOLN
INTRAVENOUS | Status: DC | PRN
  Administered 2018-12-09: 2000 mg via TOPICAL

## 2018-12-09 MED ORDER — OXYCODONE HCL ER 10 MG PO T12A: 10.0000 mg | EXTENDED_RELEASE_TABLET | Freq: Two times a day (BID) | ORAL | 0 refills | Status: DC

## 2018-12-09 SURGICAL SUPPLY — 75 items
ALCOHOL 70% 16 OZ (MISCELLANEOUS) ×3 IMPLANT
BAG DECANTER FOR FLEXI CONT (MISCELLANEOUS) ×3 IMPLANT
BANDAGE ESMARK 6X9 LF (GAUZE/BANDAGES/DRESSINGS) ×1 IMPLANT
BLADE SAG 18X100X1.27 (BLADE) ×3 IMPLANT
BNDG ELASTIC 6X10 VLCR STRL LF (GAUZE/BANDAGES/DRESSINGS) ×3 IMPLANT
BNDG ESMARK 6X9 LF (GAUZE/BANDAGES/DRESSINGS) ×3
BOWL SMART MIX CTS (DISPOSABLE) ×3 IMPLANT
CLOSURE STERI-STRIP 1/2X4 (GAUZE/BANDAGES/DRESSINGS) ×1
CLSR STERI-STRIP ANTIMIC 1/2X4 (GAUZE/BANDAGES/DRESSINGS) ×2 IMPLANT
COMP FEM SZ5 CRUC LEFT RETAIN (Orthopedic Implant) ×3 IMPLANT
COMPONENT FEM SZ5 CRU LT RETN (Orthopedic Implant) ×1 IMPLANT
COVER SURGICAL LIGHT HANDLE (MISCELLANEOUS) ×3 IMPLANT
COVER WAND RF STERILE (DRAPES) ×3 IMPLANT
CUFF TOURN SGL QUICK 34 (TOURNIQUET CUFF) ×2
CUFF TOURN SGL QUICK 42 (TOURNIQUET CUFF) IMPLANT
CUFF TRNQT CYL 34X4.125X (TOURNIQUET CUFF) ×1 IMPLANT
DERMABOND ADVANCED (GAUZE/BANDAGES/DRESSINGS) ×2
DERMABOND ADVANCED .7 DNX12 (GAUZE/BANDAGES/DRESSINGS) ×1 IMPLANT
DRAPE EXTREMITY T 121X128X90 (DISPOSABLE) ×3 IMPLANT
DRAPE HALF SHEET 40X57 (DRAPES) ×3 IMPLANT
DRAPE INCISE IOBAN 66X45 STRL (DRAPES) IMPLANT
DRAPE ORTHO SPLIT 77X108 STRL (DRAPES) ×4
DRAPE POUCH INSTRU U-SHP 10X18 (DRAPES) ×3 IMPLANT
DRAPE SURG ORHT 6 SPLT 77X108 (DRAPES) ×2 IMPLANT
DRAPE U-SHAPE 47X51 STRL (DRAPES) ×6 IMPLANT
DRSG PAD ABDOMINAL 8X10 ST (GAUZE/BANDAGES/DRESSINGS) ×6 IMPLANT
DURAPREP 26ML APPLICATOR (WOUND CARE) ×9 IMPLANT
ELECT CAUTERY BLADE 6.4 (BLADE) ×3 IMPLANT
ELECT REM PT RETURN 9FT ADLT (ELECTROSURGICAL) ×3
ELECTRODE REM PT RTRN 9FT ADLT (ELECTROSURGICAL) ×1 IMPLANT
GAUZE SPONGE 4X4 12PLY STRL LF (GAUZE/BANDAGES/DRESSINGS) ×3 IMPLANT
GAUZE SPONGE 4X4 16PLY XRAY LF (GAUZE/BANDAGES/DRESSINGS) ×3 IMPLANT
GLOVE BIOGEL PI IND STRL 7.0 (GLOVE) ×1 IMPLANT
GLOVE BIOGEL PI INDICATOR 7.0 (GLOVE) ×2
GLOVE ECLIPSE 7.0 STRL STRAW (GLOVE) ×9 IMPLANT
GLOVE SKINSENSE NS SZ7.5 (GLOVE) ×2
GLOVE SKINSENSE STRL SZ7.5 (GLOVE) ×1 IMPLANT
GLOVE SURG SYN 7.5  E (GLOVE) ×8
GLOVE SURG SYN 7.5 E (GLOVE) ×4 IMPLANT
GOWN STRL REIN XL XLG (GOWN DISPOSABLE) ×3 IMPLANT
GOWN STRL REUS W/ TWL LRG LVL3 (GOWN DISPOSABLE) ×1 IMPLANT
GOWN STRL REUS W/TWL LRG LVL3 (GOWN DISPOSABLE) ×2
HANDPIECE INTERPULSE COAX TIP (DISPOSABLE) ×2
HOOD PEEL AWAY FLYTE STAYCOOL (MISCELLANEOUS) ×6 IMPLANT
INSERT TIBIAL BEARING 11MM SZ4 (Knees) ×3 IMPLANT
KIT BASIN OR (CUSTOM PROCEDURE TRAY) ×3 IMPLANT
KIT TURNOVER KIT B (KITS) ×3 IMPLANT
KNEE PATELLA ASYMMETRIC 9X29 (Knees) ×3 IMPLANT
KNEE TIBIAL COMP TRI SZ4 (Knees) ×3 IMPLANT
MANIFOLD NEPTUNE II (INSTRUMENTS) ×3 IMPLANT
MARKER SKIN DUAL TIP RULER LAB (MISCELLANEOUS) ×3 IMPLANT
NEEDLE SPNL 18GX3.5 QUINCKE PK (NEEDLE) ×6 IMPLANT
NS IRRIG 1000ML POUR BTL (IV SOLUTION) ×3 IMPLANT
PACK TOTAL JOINT (CUSTOM PROCEDURE TRAY) ×3 IMPLANT
PAD ARMBOARD 7.5X6 YLW CONV (MISCELLANEOUS) ×6 IMPLANT
PAD CAST 4YDX4 CTTN HI CHSV (CAST SUPPLIES) ×1 IMPLANT
PADDING CAST COTTON 4X4 STRL (CAST SUPPLIES) ×2
SAW OSC TIP CART 19.5X105X1.3 (SAW) ×3 IMPLANT
SET HNDPC FAN SPRY TIP SCT (DISPOSABLE) ×1 IMPLANT
STAPLER VISISTAT 35W (STAPLE) IMPLANT
SUCTION FRAZIER HANDLE 10FR (MISCELLANEOUS) ×2
SUCTION TUBE FRAZIER 10FR DISP (MISCELLANEOUS) ×1 IMPLANT
SUT ETHILON 2 0 FS 18 (SUTURE) IMPLANT
SUT MNCRL AB 4-0 PS2 18 (SUTURE) IMPLANT
SUT VIC AB 0 CT1 27 (SUTURE) ×4
SUT VIC AB 0 CT1 27XBRD ANBCTR (SUTURE) ×2 IMPLANT
SUT VIC AB 1 CTX 27 (SUTURE) ×9 IMPLANT
SUT VIC AB 2-0 CT1 27 (SUTURE) ×12
SUT VIC AB 2-0 CT1 TAPERPNT 27 (SUTURE) ×6 IMPLANT
SYRINGE REG LEUR 60CC (SYRINGE) ×6 IMPLANT
TOWEL GREEN STERILE (TOWEL DISPOSABLE) ×3 IMPLANT
TOWEL GREEN STERILE FF (TOWEL DISPOSABLE) ×3 IMPLANT
TRAY CATH 16FR W/PLASTIC CATH (SET/KITS/TRAYS/PACK) IMPLANT
UNDERPAD 30X30 (UNDERPADS AND DIAPERS) ×3 IMPLANT
WRAP KNEE MAXI GEL POST OP (GAUZE/BANDAGES/DRESSINGS) ×3 IMPLANT

## 2018-12-09 NOTE — Anesthesia Procedure Notes (Signed)
Spinal  Patient location during procedure: OR Start time: 12/09/2018 7:30 AM End time: 12/09/2018 7:35 AM Staffing Anesthesiologist: Pervis Hocking, DO Performed: anesthesiologist  Preanesthetic Checklist Completed: patient identified, surgical consent, pre-op evaluation, timeout performed, IV checked, risks and benefits discussed and monitors and equipment checked Spinal Block Patient position: sitting Prep: site prepped and draped and DuraPrep Patient monitoring: cardiac monitor, continuous pulse ox and blood pressure Approach: midline Location: L3-4 Injection technique: single-shot Needle Needle type: Pencan  Needle gauge: 24 G Needle length: 9 cm Assessment Sensory level: T6 Additional Notes Functioning IV was confirmed and monitors were applied. Sterile prep and drape, including hand hygiene and sterile gloves were used. The patient was positioned and the spine was prepped. The skin was anesthetized with lidocaine.  Free flow of clear CSF was obtained prior to injecting local anesthetic into the CSF.  The spinal needle aspirated freely following injection.  The needle was carefully withdrawn.  The patient tolerated the procedure well.

## 2018-12-09 NOTE — Anesthesia Postprocedure Evaluation (Signed)
Anesthesia Post Note  Patient: Jodi Roberts  Procedure(s) Performed: LEFT TOTAL KNEE ARTHROPLASTY (Left Knee)     Patient location during evaluation: PACU Anesthesia Type: Regional, Spinal and MAC Level of consciousness: awake and alert Pain management: pain level controlled Vital Signs Assessment: post-procedure vital signs reviewed and stable Respiratory status: spontaneous breathing, nonlabored ventilation and respiratory function stable Cardiovascular status: blood pressure returned to baseline and stable Postop Assessment: no apparent nausea or vomiting, no headache, no backache, spinal receding and patient able to bend at knees Anesthetic complications: no    Last Vitals:  Vitals:   12/09/18 1005 12/09/18 1035  BP: 113/69 109/84  Pulse: 82 81  Resp: 19 16  Temp: (!) 36.1 C   SpO2: 100% 98%    Last Pain:  Vitals:   12/09/18 1035  TempSrc:   PainSc: 0-No pain                 Jarome Matin Ruth Tully

## 2018-12-09 NOTE — Op Note (Signed)
Total Knee Arthroplasty Procedure Note  Preoperative diagnosis: Left knee osteoarthritis  Postoperative diagnosis:same  Operative procedure: Left total knee arthroplasty. CPT [id number]  Surgeon: N. Glee Arvin, MD  Assist: Oneal Grout, PA-C; necessary for the timely completion of procedure and due to complexity of procedure.  Anesthesia: Spinal, regional  Tourniquet time: less than 60 mins  Implants used: [employer] Triatholon Femur: CR 5 pressfit Tibia: 4 pressfit Patella: 29 mm pressfit Polyethylene: 11 mm, CS  Indication: Jodi Roberts is a 28 y.o. year old female with a history of knee pain. Having failed conservative management, the patient elected to proceed with a total knee arthroplasty.  We have reviewed the risk and benefits of the surgery and they elected to proceed after voicing understanding.  Procedure:  After informed consent was obtained and understanding of the risk were voiced including but not limited to bleeding, infection, damage to surrounding structures including nerves and vessels, blood clots, leg length inequality and the failure to achieve desired results, the operative extremity was marked with verbal confirmation of the patient in the holding area.   The patient was then brought to the operating room and transported to the operating room table in the supine position.  A tourniquet was applied to the operative extremity around the upper thigh. The operative limb was then prepped and draped in the usual sterile fashion and preoperative antibiotics were administered.  A time out was performed prior to the start of surgery confirming the correct extremity, preoperative antibiotic administration, as well as team members, implants and instruments available for the case. Correct surgical site was also confirmed with preoperative radiographs. The limb was then elevated for exsanguination and the tourniquet was inflated. A midline incision was made and a standard  medial parapatellar approach was performed.  She had end stage DJD of the medial compartment and evidence of early DJD of the PF compartment.  The patella was prepared and sized to a 29 mm.  A cover was placed on the patella for protection from retractors.  We then turned our attention to the femur.  The ACL was deficient. Start site was drilled in the femur and the intramedullary distal femoral cutting guide was placed, set at 5 degrees valgus, taking 9 mm of distal resection. The distal cut was made. Osteophytes were then removed.  Extension gap was then checked. Next, the proximal tibial cutting guide was placed with appropriate slope, varus/valgus alignment and depth of resection. The proximal tibial cut was made taking 4 mm off from the medial side. Gap blocks were then used to assess the extension gap and alignment, and appropriate soft tissue releases were performed. Attention was turned back to the femur, which was sized using the sizing guide to a size 5. Appropriate rotation of the femoral component was determined using epicondylar axis, Whiteside's line, and assessing the flexion gap under ligament tension. The appropriate size 4-in-1 cutting block was placed and cuts were made.  Posterior femoral osteophytes and uncapped bone were then removed with the curved osteotome.  Trial components were placed, and stability was checked in full extension, mid-flexion, and deep flexion. Proper tibial rotation was determined and marked.  The patella tracked well without a lateral release. Trial components were then removed and tibial preparation performed.  The tibia was sized for a size 4 component.  A posterior capsular injection comprising of 20 cc of 1.3% exparel, 20 cc of 0.25% bupivicaine with epi and 20 cc of normal saline was performed for postoperative pain  control. The bony surfaces were irrigated with a pulse lavage and then dried. Bone cement was vacuum mixed on the back table, and the final  components sized above were cemented into place. After cement had finished curing, excess cement was removed. The stability of the construct was re-evaluated throughout a range of motion and found to be acceptable. The trial liner was removed, the knee was copiously irrigated, and the knee was re-evaluated for any excess bone debris. The real polyethylene liner, 11 mm thick, was inserted and checked to ensure the locking mechanism had engaged appropriately. The tourniquet was deflated and hemostasis was achieved. The wound was irrigated with normal saline.  One gram of vancomycin powder was placed in the surgical bed.  Capsular closure was performed with a #1 vicryl, subcutaneous fat closed with a 0 vicryl suture, then subcutaneous tissue closed with interrupted 2.0 vicryl suture. The skin was then closed with a 4.0 monocryl. A sterile dressing was applied.  The patient was awakened in the operating room and taken to recovery in stable condition. All sponge, needle, and instrument counts were correct at the end of the case.  Position: supine  Complications: none.  Time Out: performed   Drains/Packing: none  Estimated blood loss: minimal  Returned to Recovery Room: in good condition.   Antibiotics: yes   Mechanical VTE (DVT) Prophylaxis: sequential compression devices, TED thigh-high  Chemical VTE (DVT) Prophylaxis: aspirin  Fluid Replacement  Crystalloid: see anesthesia record Blood: none  FFP: none   Specimens Removed: 1 to pathology   Sponge and Instrument Count Correct? yes   PACU: portable radiograph - knee AP and Lateral   Plan/RTC: Return in 2 weeks for wound check.   Weight Bearing/Load Lower Extremity: full   N. Eduard Roux, MD [facility] 8:55 AM

## 2018-12-09 NOTE — Evaluation (Signed)
Physical Therapy Evaluation Patient Details Name: Jodi Roberts MRN: [id number] DOB: 1990-08-14 Today's Date: 12/09/2018   History of Present Illness  28yo female s/p L TKR done 12/09/18. PMH R knee arthroscopy, knee cartilage surgery  Clinical Impression  Patient received in bed, very pleasant and very motivated to participate in PT today. Able to complete bed mobility with MinA to support L LE, functional transfers with RW and cues for safety, and gait approximately 178f with RW and Min guard for safety due to L LE intermitttently mildly buckling with gait, patient able to self correct with B UE support and min guard from PT. She was left up in the chair with all needs met, questions/concerns addressed this afternoon. Recommend ongoing skilled PT services as per surgeon's preference moving forward.     Follow Up Recommendations Follow surgeon's recommendation for DC plan and follow-up therapies    Equipment Recommendations  Rolling walker with 5" wheels;3in1 (PT)    Recommendations for Other Services       Precautions / Restrictions Precautions Precautions: Knee;Fall Precaution Booklet Issued: No Precaution Comments: discussed precautions verbally Restrictions Weight Bearing Restrictions: Yes LLE Weight Bearing: Weight bearing as tolerated      Mobility  Bed Mobility Overal bed mobility: Needs Assistance Bed Mobility: Supine to Sit     Supine to sit: Min assist     General bed mobility comments: MinA to support L LE, otherwise no assist given  Transfers Overall transfer level: Needs assistance Equipment used: Rolling walker (2 wheeled) Transfers: Sit to/from Stand Sit to Stand: Supervision         General transfer comment: S for safety, cues for hand placement  Ambulation/Gait Ambulation/Gait assistance: Min guard Gait Distance (Feet): 100 Feet Assistive device: Rolling walker (2 wheeled) Gait Pattern/deviations: Step-through pattern;Decreased step length -  left;Decreased stance time - right;Decreased stride length;Decreased weight shift to right Gait velocity: decreased   General Gait Details: reduced step length and stance time L LE, multiple occasions of mild L knee buckling with gait requiring Min guard to maintain balance  Stairs            Wheelchair Mobility    Modified Rankin (Stroke Patients Only)       Balance Overall balance assessment: Mild deficits observed, not formally tested                                           Pertinent Vitals/Pain Pain Assessment: No/denies pain    Home Living Family/patient expects to be discharged to:: Private residence Living Arrangements: Non-relatives/Friends Available Help at Discharge: Friend(s)(room-mate) Type of Home: Apartment Home Access: Stairs to enter Entrance Stairs-Rails: Can reach both Entrance Stairs-Number of Steps: B railings Home Layout: One level Home Equipment: WEnvironmental consultant- 2 wheels Additional Comments: no falls, working as oSales promotion account executivefor [employer]   Prior Function Level of Independence: Independent               HJournalist, newspaper  Dominant Hand: Left    Extremity/Trunk Assessment   Upper Extremity Assessment Upper Extremity Assessment: Overall WFL for tasks assessed    Lower Extremity Assessment Lower Extremity Assessment: LLE deficits/detail;RLE deficits/detail RLE Deficits / Details: WNL RLE Sensation: WNL RLE Coordination: WNL LLE Deficits / Details: mild weakness, mild knee buckling with gait    Cervical / Trunk Assessment Cervical / Trunk Assessment: Normal  Communication   Communication: No  difficulties  Cognition Arousal/Alertness: Awake/alert Behavior During Therapy: WFL for tasks assessed/performed Overall Cognitive Status: Within Functional Limits for tasks assessed                                        General Comments      Exercises Total Joint Exercises Goniometric ROM: L knee ROM  in supine: 22 degrees extension to 56 degrees flexion   Assessment/Plan    PT Assessment Patient needs continued PT services  PT Problem List Decreased strength;Decreased range of motion;Obesity;Decreased balance;Pain;Decreased knowledge of precautions;Decreased knowledge of use of DME;Decreased mobility;Decreased coordination       PT Treatment Interventions DME instruction;Balance training;Gait training;Neuromuscular re-education;Stair training;Functional mobility training;Patient/family education;Therapeutic activities;Therapeutic exercise;Manual techniques    PT Goals (Current goals can be found in the Care Plan section)  Acute Rehab PT Goals Patient Stated Goal: feel better PT Goal Formulation: With patient Time For Goal Achievement: 12/23/18 Potential to Achieve Goals: Good    Frequency 7X/week   Barriers to discharge        Co-evaluation               AM-PAC PT "6 Clicks" Mobility  Outcome Measure Help needed turning from your back to your side while in a flat bed without using bedrails?: None Help needed moving from lying on your back to sitting on the side of a flat bed without using bedrails?: A Little Help needed moving to and from a bed to a chair (including a wheelchair)?: A Little Help needed standing up from a chair using your arms (e.g., wheelchair or bedside chair)?: A Little Help needed to walk in hospital room?: A Little Help needed climbing 3-5 steps with a railing? : A Little 6 Click Score: 19    End of Session Equipment Utilized During Treatment: Gait belt Activity Tolerance: Patient tolerated treatment well Patient left: in chair;with call bell/phone within reach   PT Visit Diagnosis: Difficulty in walking, not elsewhere classified (R26.2);Pain;Muscle weakness (generalized) (M62.81) Pain - Right/Left: Left Pain - part of body: Knee    Time: 1300-1330 PT Time Calculation (min) (ACUTE ONLY): 30 min   Charges:   PT Evaluation $PT Eval Low  Complexity: 1 Low PT Treatments $Gait Training: 8-22 mins        Deniece Ree PT, DPT, CBIS  Supplemental Physical Therapist [place]    Pager [phone] Acute Rehab Office [phone]

## 2018-12-09 NOTE — Transfer of Care (Signed)
Immediate Anesthesia Transfer of Care Note  Patient: Jodi Roberts  Procedure(s) Performed: LEFT TOTAL KNEE ARTHROPLASTY (Left Knee)  Patient Location: PACU  Anesthesia Type:Spinal and MAC combined with regional for post-op pain  Level of Consciousness: awake, alert  and oriented  Airway & Oxygen Therapy: Patient Spontanous Breathing and Patient connected to face mask oxygen  Post-op Assessment: Report given to RN, Post -op Vital signs reviewed and stable and Patient moving all extremities  Post vital signs: Reviewed and stable  Last Vitals:  Vitals Value Taken Time  BP 113/65 12/09/18 0936  Temp 36.1 C 12/09/18 0936  Pulse 90 12/09/18 0940  Resp 17 12/09/18 0940  SpO2 100 % 12/09/18 0940  Vitals shown include unvalidated device data.  Last Pain:  Vitals:   12/09/18 0936  TempSrc:   PainSc: (P) Asleep      Patients Stated Pain Goal: 3 (23/55/73 2202)  Complications: No apparent anesthesia complications

## 2018-12-09 NOTE — Anesthesia Procedure Notes (Signed)
Anesthesia Regional Block: Adductor canal block   Pre-Anesthetic Checklist: ,, timeout performed, Correct Patient, Correct Site, Correct Laterality, Correct Procedure, Correct Position, site marked, Risks and benefits discussed,  Surgical consent,  Pre-op evaluation,  At surgeon's request and post-op pain management  Laterality: Left  Prep: Maximum Sterile Barrier Precautions used, chloraprep       Needles:  Injection technique: Single-shot  Needle Type: Echogenic Stimulator Needle     Needle Length: 9cm  Needle Gauge: 22     Additional Needles:   Procedures:,,,, ultrasound used (permanent image in chart),,,,  Narrative:  Start time: 12/09/2018 7:05 AM End time: 12/09/2018 7:10 AM Injection made incrementally with aspirations every 5 mL.  Performed by: Personally  Anesthesiologist: Pervis Hocking, DO  Additional Notes: Monitors applied. No increased pain on injection. No increased resistance to injection. Injection made in 5cc increments. Good needle visualization. Patient tolerated procedure well.

## 2018-12-09 NOTE — H&P (Signed)
PREOPERATIVE H&P  Chief Complaint: left knee degenerative joint disease  HPI: Jodi Roberts is a 27 y.o. female who presents for surgical treatment of left knee degenerative joint disease.  She denies any changes in medical history.  Past Medical History:  Diagnosis Date  . Arthritis    osteoarthritis   Past Surgical History:  Procedure Laterality Date  . KNEE ARTHROSCOPY Right    reconstruction surgery  . KNEE CARTILAGE SURGERY Left   . LEG SURGERY Right    rods and screws  . WISDOM TOOTH EXTRACTION     Social History   Socioeconomic History  . Marital status: Single    Spouse name: Not on file  . Number of children: Not on file  . Years of education: Not on file  . Highest education level: Not on file  Occupational History  . Not on file  Social Needs  . Financial resource strain: Not on file  . Food insecurity    Worry: Not on file    Inability: Not on file  . Transportation needs    Medical: Not on file    Non-medical: Not on file  Tobacco Use  . Smoking status: Never Smoker  . Smokeless tobacco: Never Used  Substance and Sexual Activity  . Alcohol use: Never    Frequency: Never  . Drug use: Never  . Sexual activity: Not on file  Lifestyle  . Physical activity    Days per week: Not on file    Minutes per session: Not on file  . Stress: Not on file  Relationships  . Social Musician on phone: Not on file    Gets together: Not on file    Attends religious service: Not on file    Active member of club or organization: Not on file    Attends meetings of clubs or organizations: Not on file    Relationship status: Not on file  Other Topics Concern  . Not on file  Social History Narrative  . Not on file   Family History  Family history unknown: Yes   Allergies  Allergen Reactions  . Shellfish Allergy Anaphylaxis and Swelling    Throat closes Only with eating.  Ok to touch iodine or betadine.   Prior to Admission medications    Medication Sig Start Date End Date Taking? Authorizing Provider  albuterol (PROVENTIL HFA;VENTOLIN HFA) 108 (90 Base) MCG/ACT inhaler Inhale 1-2 puffs into the lungs every 6 (six) hours as needed for wheezing or shortness of breath. 05/14/18   Eustace Moore, MD  cetirizine (ZYRTEC) 10 MG tablet Take 1 tablet (10 mg total) by mouth daily. Patient taking differently: Take 10 mg by mouth daily as needed (during [facility] ONLY for allergies.).  05/14/18   Eustace Moore, MD  potassium chloride (KLOR-CON) 10 MEQ tablet Take four tabs po bid x 2 days 12/03/18   Cristie Hem, PA-C     Positive ROS: All other systems have been reviewed and were otherwise negative with the exception of those mentioned in the HPI and as above.  Physical Exam: General: Alert, no acute distress Cardiovascular: No pedal edema Respiratory: No cyanosis, no use of accessory musculature GI: abdomen soft Skin: No lesions in the area of chief complaint Neurologic: Sensation intact distally Psychiatric: Patient is competent for consent with normal mood and affect Lymphatic: no lymphedema  MUSCULOSKELETAL: exam stable  Assessment: left knee degenerative joint disease  Plan: Plan for Procedure(s): LEFT TOTAL KNEE  ARTHROPLASTY  The risks benefits and alternatives were discussed with the patient including but not limited to the risks of nonoperative treatment, versus surgical intervention including infection, bleeding, nerve injury,  blood clots, cardiopulmonary complications, morbidity, mortality, among others, and they were willing to proceed.   Preoperative templating of the joint replacement has been completed, documented, and submitted to the Operating Room personnel in order to optimize intra-operative equipment management.  Patient's anticipated LOS is less than 2 midnights, meeting these requirements: - Younger than 34 - Lives within 1 hour of care - Has a competent adult at home to recover with  post-op recover - NO history of  - Chronic pain requiring opiods  - Diabetes  - Coronary Artery Disease  - Heart failure  - Heart attack  - Stroke  - DVT/VTE  - Cardiac arrhythmia  - Respiratory Failure/COPD  - Renal failure  - Anemia  - Advanced Liver disease  Eduard Roux, MD   12/09/2018 7:13 AM

## 2018-12-09 NOTE — Progress Notes (Signed)
1055 Received pt from PACU, A&O x4. LLE with ace wrap dry and intact. CPM on. Pt denies pain at this time.

## 2018-12-09 NOTE — Progress Notes (Signed)
Orthopedic Tech Progress Note Patient Details:  Jodi Roberts 1990/10/08 [id number]  CPM Left Knee CPM Left Knee: On Left Knee Flexion (Degrees): 90 Left Knee Extension (Degrees): 0  Post Interventions Patient Tolerated: Well Instructions Provided: Care of device  [name] 12/09/2018, 10:47 AM

## 2018-12-10 LAB — BASIC METABOLIC PANEL
Anion gap: 8 (ref 5–15)
BUN: 13 mg/dL (ref 6–20)
CO2: 23 mmol/L (ref 22–32)
Calcium: 8.4 mg/dL — ABNORMAL LOW (ref 8.9–10.3)
Chloride: 107 mmol/L (ref 98–111)
Creatinine, Ser: 0.73 mg/dL (ref 0.44–1.00)
GFR calc Af Amer: >60 mL/min (ref >60)
GFR calc non Af Amer: >60 mL/min (ref >60)
Glucose, Bld: 146 mg/dL — ABNORMAL HIGH (ref 70–99)
Potassium: 3.9 mmol/L (ref 3.5–5.1)
Sodium: 138 mmol/L (ref 135–145)

## 2018-12-10 LAB — CBC
HCT: 29.6 % — ABNORMAL LOW (ref 36.0–46.0)
Hemoglobin: 9.5 g/dL — ABNORMAL LOW (ref 12.0–15.0)
MCH: 29.5 pg (ref 26.0–34.0)
MCHC: 32.1 g/dL (ref 30.0–36.0)
MCV: 91.9 fL (ref 80.0–100.0)
Platelets: 258 10*3/uL (ref 150–400)
RBC: 3.22 MIL/uL — ABNORMAL LOW (ref 3.87–5.11)
RDW: 14.3 % (ref 11.5–15.5)
WBC: 11.3 10*3/uL — ABNORMAL HIGH (ref 4.0–10.5)
nRBC: 0 % (ref 0.0–0.2)

## 2018-12-10 MED ORDER — OXYCODONE HCL ER 10 MG PO T12A: 10.0000 mg | EXTENDED_RELEASE_TABLET | Freq: Two times a day (BID) | ORAL | 0 refills | Status: DC

## 2018-12-10 NOTE — Discharge Instructions (Signed)

## 2018-12-10 NOTE — Progress Notes (Signed)
Subjective: 1 Day Post-Op Procedure(s) (LRB): LEFT TOTAL KNEE ARTHROPLASTY (Left) Patient reports pain as mild.    Objective: Vital signs in last 24 hours: Temp:  [97 F (36.1 C)-98.2 F (36.8 C)] 98.1 F (36.7 C) (10/27 0748) Pulse Rate:  [78-89] 86 (10/27 0748) Resp:  [14-18] 18 (10/27 0748) BP: (109-116)/(64-84) 110/69 (10/27 0748) SpO2:  [97 %-100 %] 99 % (10/27 0748)  Intake/Output from previous day: 10/26 0701 - 10/27 0700 In: 1930.1 [I.V.:1680.1; IV Piggyback:200] Out: 2250 [Urine:2050; Blood:200] Intake/Output this shift: No intake/output data recorded.  Recent Labs    12/10/18 0258  HGB 9.5*   Recent Labs    12/10/18 0258  WBC 11.3*  RBC 3.22*  HCT 29.6*  PLT 258   Recent Labs    12/10/18 0258  NA 138  K 3.9  CL 107  CO2 23  BUN 13  CREATININE 0.73  GLUCOSE 146*  CALCIUM 8.4*   No results for input(s): LABPT, INR in the last 72 hours.  Neurologically intact Neurovascular intact Sensation intact distally Intact pulses distally Dorsiflexion/Plantar flexion intact Incision: dressing C/D/I No cellulitis present Compartment soft   Assessment/Plan: 1 Day Post-Op Procedure(s) (LRB): LEFT TOTAL KNEE ARTHROPLASTY (Left) Advance diet Up with therapy D/C IV fluids Discharge home with home health  WBAT RLE Bandage changed by me today D/c foley Please apply thigh high ted hose to BLE F/u with Dr. Erlinda Hong 2 weeks post-op   Anticipated LOS equal to or greater than 2 midnights due to - Age 70 and older with one or more of the following:  - Obesity  - Expected need for hospital services (PT, OT, Nursing) required for safe  discharge  - Anticipated need for postoperative skilled nursing care or inpatient rehab  - Active co-morbidities: None OR   - Unanticipated findings during/Post Surgery: None  - Patient is a high risk of re-admission due to: None    Aundra Dubin 12/10/2018, 10:07 AM

## 2018-12-10 NOTE — TOC Initial Note (Signed)
Transition of Care [facility]) - Initial/Assessment Note    Patient Details  Name: Roiza Wiedel MRN: [id number] Date of Birth: 1991-02-10  Transition of Care [facility]) CM/SW Contact:    Atilano Median, LCSW Phone Number: 12/10/2018, 12:08 PM  Clinical Narrative:                 Patient admitted for L knee replacement. OR on 10/26. PT recommending home with home health physical therapy with rolling walker and 3in1 Bedside commode. SW spoke with patient and she is amenable to this, but states she already has a rolling walker at home. No other needs at this time.   Expected Discharge Plan: [place] Barriers to Discharge: Continued Medical Work up   Patient Goals and CMS Choice Patient states their goals for this hospitalization and ongoing recovery are:: return home CMS Medicare.gov Compare Post Acute Care list provided to:: Patient Choice offered to / list presented to : Patient  Expected Discharge Plan and Services Expected Discharge Plan: [place] In-house Referral: Clinical Social Work Discharge Planning Services: CM Consult   Living arrangements for the past 2 months: [place] Expected Discharge Date: 12/10/18               DME Arranged: 3-N-1 DME Agency: [employer] Date DME Agency Contacted: 12/10/18 Time DME Agency Contacted: 1206 Representative spoke with at DME Agency: [place]: PT [place]: [facility] ([facility] [employer]) Date [place]: 12/09/18 Time [place]: [place] Representative spoke with at [place]: [place] Arrangements/Services Living arrangements for the past 2 months: [place] Lives with:: Self, Relatives   Do you feel safe going back to the place where you live?: Yes               Activities of Daily Living Home Assistive Devices/Equipment: None ADL Screening (condition at time of admission) Patient's cognitive ability adequate to safely  complete daily activities?: Yes Is the patient deaf or have difficulty hearing?: No Does the patient have difficulty seeing, even when wearing glasses/contacts?: No Does the patient have difficulty concentrating, remembering, or making decisions?: No Patient able to express need for assistance with ADLs?: Yes Does the patient have difficulty dressing or bathing?: No Independently performs ADLs?: Yes (appropriate for developmental age) Does the patient have difficulty walking or climbing stairs?: Yes Weakness of Legs: Left Weakness of Arms/Hands: None  Permission Sought/Granted                  Emotional Assessment   Attitude/Demeanor/Rapport: Engaged, Gracious   Orientation: : Oriented to Self, Oriented to Place, Oriented to  Time, Oriented to Situation      Admission diagnosis:  left knee degenerative joint disease Patient Active Problem List   Diagnosis Date Noted  . Status post total left knee replacement 12/09/2018  . Degenerative arthritis of left knee 10/24/2018   PCP:  Patient, No Pcp Per Pharmacy:   [facility] [place] [address] Phone: [phone] Fax: [phone]  [id number] [place] [address] [facility] [address]. [place] [address] Phone: [phone] Fax: [phone]     Social Determinants of Health (SDOH) Interventions    Readmission Risk Interventions No flowsheet data found.

## 2018-12-10 NOTE — Discharge Summary (Addendum)
Patient ID: Jodi Roberts MRN: [id number] DOB/AGE: Sep 06, 1990 28 y.o.  Admit date: 12/09/2018 Discharge date: 12/10/2018  Admission Diagnoses:  Principal Problem:   Degenerative arthritis of left knee Active Problems:   Status post total left knee replacement   Discharge Diagnoses:  Same  Past Medical History:  Diagnosis Date  . Arthritis    osteoarthritis    Surgeries: Procedure(s): LEFT TOTAL KNEE ARTHROPLASTY on 12/09/2018   Consultants:   Discharged Condition: Improved  Hospital Course: Jodi Roberts is an 28 y.o. female who was admitted 12/09/2018 for operative treatment ofDegenerative arthritis of left knee. Patient has severe unremitting pain that affects sleep, daily activities, and work/hobbies. After pre-op clearance the patient was taken to the operating room on 12/09/2018 and underwent  Procedure(s): LEFT TOTAL KNEE ARTHROPLASTY.    Patient was given perioperative antibiotics:  Anti-infectives (From admission, onward)   Start     Dose/Rate Route Frequency Ordered Stop   12/09/18 1115  ceFAZolin (ANCEF) IVPB 2g/100 mL premix     2 g 200 mL/hr over 30 Minutes Intravenous Every 6 hours 12/09/18 1101 12/10/18 0021   12/09/18 0840  vancomycin (VANCOCIN) powder  Status:  Discontinued       As needed 12/09/18 0840 12/09/18 0932   12/09/18 0615  ceFAZolin (ANCEF) IVPB 2g/100 mL premix     2 g 200 mL/hr over 30 Minutes Intravenous On call to O.R. 12/09/18 0601 12/09/18 0740   12/09/18 0603  ceFAZolin (ANCEF) 2-4 GM/100ML-% IVPB    Note to Pharmacy: Tressia Miners Ch: cabinet override      12/09/18 0603 12/09/18 0740       Patient was given sequential compression devices, early ambulation, and chemoprophylaxis to prevent DVT.  Patient benefited maximally from hospital stay and there were no complications.    Recent vital signs:  Patient Vitals for the past 24 hrs:  BP Temp Temp src Pulse Resp SpO2  12/10/18 0748 110/69 98.1 F (36.7 C) Oral 86 18 99 %   12/10/18 0412 110/64 98.1 F (36.7 C) Oral 85 16 97 %  12/09/18 2356 114/67 97.9 F (36.6 C) Oral 80 16 97 %  12/09/18 1932 116/67 98.2 F (36.8 C) Oral 89 16 98 %  12/09/18 1059 116/73 (!) 97.5 F (36.4 C) Oral 78 16 100 %  12/09/18 1043 114/80 (!) 97 F (36.1 C) - 88 14 99 %  12/09/18 1035 109/84 - - 81 16 98 %     Recent laboratory studies:  Recent Labs    12/10/18 0258  WBC 11.3*  HGB 9.5*  HCT 29.6*  PLT 258  NA 138  K 3.9  CL 107  CO2 23  BUN 13  CREATININE 0.73  GLUCOSE 146*  CALCIUM 8.4*     Discharge Medications:   Allergies as of 12/10/2018      Reactions   Shellfish Allergy Anaphylaxis, Swelling   Throat closes Only with eating.  Ok to touch iodine or betadine.      Medication List    STOP taking these medications   potassium chloride 10 MEQ tablet Commonly known as: KLOR-CON     TAKE these medications   albuterol 108 (90 Base) MCG/ACT inhaler Commonly known as: VENTOLIN HFA Inhale 1-2 puffs into the lungs every 6 (six) hours as needed for wheezing or shortness of breath.   aspirin EC 81 MG tablet Take 1 tablet (81 mg total) by mouth 2 (two) times daily.   cetirizine 10 MG tablet Commonly known as: ZYRTEC Take  1 tablet (10 mg total) by mouth daily. What changed:   when to take this  reasons to take this   ketorolac 10 MG tablet Commonly known as: TORADOL Take 1 tablet (10 mg total) by mouth 2 (two) times daily as needed.   methocarbamol 750 MG tablet Commonly known as: ROBAXIN Take 1 tablet (750 mg total) by mouth 2 (two) times daily as needed for muscle spasms.   ondansetron 4 MG tablet Commonly known as: ZOFRAN Take 1-2 tablets (4-8 mg total) by mouth every 8 (eight) hours as needed for nausea or vomiting.   oxyCODONE 5 MG immediate release tablet Commonly known as: Oxy IR/ROXICODONE Take 1-2 tablets (5-10 mg total) by mouth every 8 (eight) hours as needed for severe pain.   oxyCODONE 10 mg 12 hr tablet Commonly known as:  OXYCONTIN Take 1 tablet (10 mg total) by mouth every 12 (twelve) hours.   promethazine 25 MG tablet Commonly known as: PHENERGAN Take 1 tablet (25 mg total) by mouth every 6 (six) hours as needed for nausea.   senna-docusate 8.6-50 MG tablet Commonly known as: Senokot S Take 1-2 tablets by mouth at bedtime as needed.            Durable Medical Equipment  (From admission, onward)         Start     Ordered   12/09/18 1101  DME Walker rolling  Once    Question:  Patient needs a walker to treat with the following condition  Answer:  Total knee replacement status   12/09/18 1101   12/09/18 1101  DME 3 n 1  Once     12/09/18 1101   12/09/18 1101  DME Bedside commode  Once    Question:  Patient needs a bedside commode to treat with the following condition  Answer:  Total knee replacement status   12/09/18 1101          Diagnostic Studies: Dg Chest 2 View  Result Date: 12/03/2018 CLINICAL DATA:  Pre-admission left knee surgery EXAM: CHEST - 2 VIEW COMPARISON:  05/14/2018 FINDINGS: The heart size and mediastinal contours are within normal limits. Both lungs are clear. The visualized skeletal structures are unremarkable. IMPRESSION: No active cardiopulmonary disease. Electronically Signed   By: Jasmine Pang M.D.   On: 12/03/2018 20:13   Dg Knee Left Port  Result Date: 12/09/2018 CLINICAL DATA:  Post left knee replacement EXAM: PORTABLE LEFT KNEE - 1-2 VIEW COMPARISON:  10/16/2018 FINDINGS: Changes of left knee replacement. No hardware or bony complicating feature. Soft tissue and joint space gas noted. IMPRESSION: Left knee replacement.  No visible complicating feature. Electronically Signed   By: Charlett Nose M.D.   On: 12/09/2018 10:38    Disposition: Discharge disposition: 01-Home or Self Care         Follow-up Information    Tarry Kos, MD. Schedule an appointment as soon as possible for a visit in 2 week(s).   Specialty: Orthopedic Surgery Contact  information: [address] [place] [address] [phone]            Signed: Cristie Hem 12/10/2018, 10:13 AM

## 2018-12-10 NOTE — Progress Notes (Signed)
Physical Therapy Treatment Patient Details Name: Jodi Roberts MRN: [id number] DOB: [birth year] Today's Date: 12/10/2018    History of Present Illness 28yo female s/p L TKR done 12/09/18. PMH R knee arthroscopy, knee cartilage surgery    PT Comments    Patient received in bed, very pleasant and motivated to participate in PT. L knee ROM in supine 12 degrees extension to 55 degrees flexion. Practiced all appropriate TKR exercises with VC and good teach back/return demonstration, then able to perform bed mobility with S, functional transfers with S and RW, and gait approximately 123f with RW and S. Introduced stairs which patient was able to perform with light min guard for safety and VC for technique. Able to toilet and perform pericare with distant S. She was left in bed with all needs met and questions/concerns addressed this morning. From PT/mobility perspective, ready for DC home but will plan to see her in the afternoon if she is still here later in the day.     Follow Up Recommendations  Follow surgeon's recommendation for DC plan and follow-up therapies     Equipment Recommendations  Rolling walker with 5" wheels;3in1 (PT)    Recommendations for Other Services       Precautions / Restrictions Precautions Precautions: Knee;Fall Precaution Booklet Issued: No Precaution Comments: discussed precautions verbally Restrictions Weight Bearing Restrictions: Yes LLE Weight Bearing: Weight bearing as tolerated    Mobility  Bed Mobility Overal bed mobility: Needs Assistance Bed Mobility: Supine to Sit;Sit to Supine     Supine to sit: Supervision Sit to supine: Supervision   General bed mobility comments: S for safety, no physical assist given  Transfers Overall transfer level: Needs assistance Equipment used: Rolling walker (2 wheeled) Transfers: Sit to/from Stand Sit to Stand: Supervision         General transfer comment: S for safety, cues for hand  placement  Ambulation/Gait Ambulation/Gait assistance: Supervision Gait Distance (Feet): 140 Feet Assistive device: Rolling walker (2 wheeled) Gait Pattern/deviations: Step-through pattern;Decreased step length - left;Decreased stance time - right;Decreased stride length;Decreased weight shift to right;Trunk flexed Gait velocity: decreased   General Gait Details: cues for increased weight bearing L LE, step length R LE, heel toe pattern   Stairs Stairs: Yes Stairs assistance: Min guard Stair Management: Two rails;Step to pattern;Forwards Number of Stairs: 2 General stair comments: light min guard, occasional VC for correct technique   Wheelchair Mobility    Modified Rankin (Stroke Patients Only)       Balance Overall balance assessment: No apparent balance deficits (not formally assessed)                                          Cognition Arousal/Alertness: Awake/alert Behavior During Therapy: WFL for tasks assessed/performed Overall Cognitive Status: Within Functional Limits for tasks assessed                                        Exercises Total Joint Exercises Ankle Circles/Pumps: Left;5 reps Quad Sets: Left;5 reps Towel Squeeze: Both;5 reps Short Arc Quad: Left;5 reps Heel Slides: Left;5 reps Hip ABduction/ADduction: Left;5 reps Straight Leg Raises: Left;5 reps Long Arc Quad: Left;5 reps Knee Flexion: Left;5 reps Goniometric ROM: L knee ROM in supine: 12 degrees extension to 55 degrees flexion    General Comments  Pertinent Vitals/Pain Pain Assessment: 0-10 Pain Score: 1  Pain Location: L knee Pain Descriptors / Indicators: Aching;Sore Pain Intervention(s): Limited activity within patient's tolerance;Monitored during session    Home Living                      Prior Function            PT Goals (current goals can now be found in the care plan section) Acute Rehab PT Goals Patient Stated Goal:  feel better PT Goal Formulation: With patient Time For Goal Achievement: 12/23/18 Potential to Achieve Goals: Good Progress towards PT goals: Progressing toward goals    Frequency    7X/week      PT Plan Current plan remains appropriate    Co-evaluation              AM-PAC PT "6 Clicks" Mobility   Outcome Measure  Help needed turning from your back to your side while in a flat bed without using bedrails?: None Help needed moving from lying on your back to sitting on the side of a flat bed without using bedrails?: None Help needed moving to and from a bed to a chair (including a wheelchair)?: A Little Help needed standing up from a chair using your arms (e.g., wheelchair or bedside chair)?: A Little Help needed to walk in hospital room?: A Little Help needed climbing 3-5 steps with a railing? : A Little 6 Click Score: 20    End of Session Equipment Utilized During Treatment: Gait belt Activity Tolerance: Patient tolerated treatment well Patient left: in bed;with call bell/phone within reach   PT Visit Diagnosis: Difficulty in walking, not elsewhere classified (R26.2);Pain;Muscle weakness (generalized) (M62.81) Pain - Right/Left: Left Pain - part of body: Knee     Time: 0600-4599 PT Time Calculation (min) (ACUTE ONLY): 28 min  Charges:  $Gait Training: 8-22 mins $Therapeutic Exercise: 8-22 mins                     Deniece Ree PT, DPT, CBIS  Supplemental Physical Therapist [place]    Pager [phone] Acute Rehab Office [phone]

## 2018-12-10 NOTE — Progress Notes (Signed)
1335 Pt is ambulating with walker. Pain is controlled. Discharge instructions given to pt, verbalized understanding.  Discharged to home accompanied by family.

## 2018-12-10 NOTE — Progress Notes (Signed)
Physical Therapy Treatment Patient Details Name: Jodi Roberts MRN: [id number] DOB: [birth year] Today's Date: 12/10/2018    History of Present Illness 28yo female s/p L TKR done 12/09/18. PMH R knee arthroscopy, knee cartilage surgery    PT Comments    Patient received sitting at EOB, very pleasant and willing to participate in second session today. Able to perform functional transfers with S and RW, tolerated improvement in gait distance to 272f with RW and S today as well. Continued stair practice and able to navigate 2 steps with B railings with S and step to pattern, good technique and carryover of sequencing today. She was left sitting at EOB waiting and ready for DC, all questions and concerns addressed and needs met this afternoon.    Follow Up Recommendations  Follow surgeon's recommendation for DC plan and follow-up therapies     Equipment Recommendations  Rolling walker with 5" wheels;3in1 (PT)    Recommendations for Other Services       Precautions / Restrictions Precautions Precautions: Knee;Fall Precaution Booklet Issued: No Precaution Comments: discussed precautions verbally Restrictions Weight Bearing Restrictions: Yes LLE Weight Bearing: Weight bearing as tolerated    Mobility  Bed Mobility Overal bed mobility: Needs Assistance Bed Mobility: Supine to Sit;Sit to Supine     Supine to sit: Supervision Sit to supine: Supervision   General bed mobility comments: sitting at EOB  Transfers Overall transfer level: Needs assistance Equipment used: Rolling walker (2 wheeled) Transfers: Sit to/from Stand Sit to Stand: Supervision         General transfer comment: S for safety, no physical assist or cues given  Ambulation/Gait Ambulation/Gait assistance: Supervision Gait Distance (Feet): 250 Feet Assistive device: Rolling walker (2 wheeled) Gait Pattern/deviations: Step-through pattern;Decreased step length - left;Decreased stance time - right;Decreased  stride length;Decreased weight shift to right;Trunk flexed Gait velocity: decreased   General Gait Details: much improved gait pattern, improved heel-toe mechanics and increased tolerance to weight bearing surgical LE   Stairs Stairs: Yes Stairs assistance: Supervision Stair Management: Two rails;Step to pattern;Forwards Number of Stairs: 2 General stair comments: S for safety, no physical assist or cues given   Wheelchair Mobility    Modified Rankin (Stroke Patients Only)       Balance Overall balance assessment: No apparent balance deficits (not formally assessed)                                          Cognition Arousal/Alertness: Awake/alert Behavior During Therapy: WFL for tasks assessed/performed Overall Cognitive Status: Within Functional Limits for tasks assessed                                           General Comments        Pertinent Vitals/Pain Pain Assessment: 0-10 Pain Score: 3  Pain Location: L knee Pain Descriptors / Indicators: Aching;Sore Pain Intervention(s): Limited activity within patient's tolerance;Monitored during session    Home Living                      Prior Function            PT Goals (current goals can now be found in the care plan section) Acute Rehab PT Goals Patient Stated Goal: feel better PT Goal Formulation: With patient  Time For Goal Achievement: 12/23/18 Potential to Achieve Goals: Good Progress towards PT goals: Progressing toward goals    Frequency    7X/week      PT Plan Current plan remains appropriate    Co-evaluation              AM-PAC PT "6 Clicks" Mobility   Outcome Measure  Help needed turning from your back to your side while in a flat bed without using bedrails?: None Help needed moving from lying on your back to sitting on the side of a flat bed without using bedrails?: None Help needed moving to and from a bed to a chair (including a  wheelchair)?: None Help needed standing up from a chair using your arms (e.g., wheelchair or bedside chair)?: None Help needed to walk in hospital room?: A Little Help needed climbing 3-5 steps with a railing? : A Little 6 Click Score: 22    End of Session Equipment Utilized During Treatment: Gait belt Activity Tolerance: Patient tolerated treatment well Patient left: in bed;with call bell/phone within reach(sitting at EOB per her request)   PT Visit Diagnosis: Difficulty in walking, not elsewhere classified (R26.2);Pain;Muscle weakness (generalized) (M62.81) Pain - Right/Left: Left Pain - part of body: Knee     Time: 7943-2761 PT Time Calculation (min) (ACUTE ONLY): 13 min  Charges:  $Gait Training: 8-22 mins       Deniece Ree PT, DPT, CBIS  Supplemental Physical Therapist [place]    Pager [phone] Acute Rehab Office [phone]

## 2018-12-11 ENCOUNTER — Encounter (HOSPITAL_COMMUNITY): Payer: Self-pay | Admitting: Orthopaedic Surgery

## 2018-12-13 ENCOUNTER — Telehealth: Payer: Self-pay | Admitting: Orthopaedic Surgery

## 2018-12-13 DIAGNOSIS — Z9181 History of falling: Secondary | ICD-10-CM | POA: Diagnosis not present

## 2018-12-13 DIAGNOSIS — Z471 Aftercare following joint replacement surgery: Secondary | ICD-10-CM | POA: Diagnosis not present

## 2018-12-13 DIAGNOSIS — Z96652 Presence of left artificial knee joint: Secondary | ICD-10-CM | POA: Diagnosis not present

## 2018-12-13 NOTE — Telephone Encounter (Signed)
I called Flor and advised. 

## 2018-12-13 NOTE — Telephone Encounter (Signed)
Flor from [facility] called to requesting VO for [facility] PT for the following:  1x a week for 4 week 1x a week for 1 week starting next week.  CB#[phone]

## 2018-12-13 NOTE — Telephone Encounter (Signed)
ok 

## 2018-12-13 NOTE — Telephone Encounter (Signed)
Ok for orders? 

## 2018-12-16 ENCOUNTER — Telehealth: Payer: Self-pay | Admitting: Orthopaedic Surgery

## 2018-12-16 DIAGNOSIS — Z96652 Presence of left artificial knee joint: Secondary | ICD-10-CM | POA: Diagnosis not present

## 2018-12-16 DIAGNOSIS — Z471 Aftercare following joint replacement surgery: Secondary | ICD-10-CM | POA: Diagnosis not present

## 2018-12-16 DIAGNOSIS — Z9181 History of falling: Secondary | ICD-10-CM | POA: Diagnosis not present

## 2018-12-16 NOTE — Telephone Encounter (Signed)
Called Flor back to approve orders.

## 2018-12-16 NOTE — Telephone Encounter (Signed)
Jodi Roberts with [facility] called in checking on her request for verbal orders for home health pt for 4 times a week for 1 week and 1 time a week for 1 week effective week of 11/2.   Please give her a call [phone]

## 2018-12-17 DIAGNOSIS — Z471 Aftercare following joint replacement surgery: Secondary | ICD-10-CM | POA: Diagnosis not present

## 2018-12-17 DIAGNOSIS — Z9181 History of falling: Secondary | ICD-10-CM | POA: Diagnosis not present

## 2018-12-17 DIAGNOSIS — Z96652 Presence of left artificial knee joint: Secondary | ICD-10-CM | POA: Diagnosis not present

## 2018-12-18 ENCOUNTER — Telehealth: Payer: Self-pay | Admitting: Orthopaedic Surgery

## 2018-12-18 DIAGNOSIS — Z471 Aftercare following joint replacement surgery: Secondary | ICD-10-CM | POA: Diagnosis not present

## 2018-12-18 DIAGNOSIS — Z9181 History of falling: Secondary | ICD-10-CM | POA: Diagnosis not present

## 2018-12-18 DIAGNOSIS — Z96652 Presence of left artificial knee joint: Secondary | ICD-10-CM | POA: Diagnosis not present

## 2018-12-18 NOTE — Telephone Encounter (Signed)
Called patient. It is pending signature. Its on Dr Phoebe Sharps desk. Patient is aware of this.

## 2018-12-18 NOTE — Telephone Encounter (Signed)
Pt called in regarding her short term disability paperwork, she contacted [facility] and they told her dr.xu hasn't signed it and sent it back to them yet and she's trying to get an update because she's running out of time to have this complete.  Please give her a call [phone]

## 2018-12-19 ENCOUNTER — Telehealth: Payer: Self-pay | Admitting: Orthopaedic Surgery

## 2018-12-19 NOTE — Telephone Encounter (Signed)
Dr Erlinda Hong Signed yesterday. Forms were placed on your chair. Will [employer] get this next?

## 2018-12-19 NOTE — Telephone Encounter (Signed)
Pt called in requesting a call back in regards to some forms dr.xu has to complete for her.   Please give her a call [phone]

## 2018-12-19 NOTE — Telephone Encounter (Signed)
They did get back to [employer] today.

## 2018-12-20 DIAGNOSIS — Z96652 Presence of left artificial knee joint: Secondary | ICD-10-CM | POA: Diagnosis not present

## 2018-12-20 DIAGNOSIS — Z9181 History of falling: Secondary | ICD-10-CM | POA: Diagnosis not present

## 2018-12-20 DIAGNOSIS — Z471 Aftercare following joint replacement surgery: Secondary | ICD-10-CM | POA: Diagnosis not present

## 2018-12-23 DIAGNOSIS — Z471 Aftercare following joint replacement surgery: Secondary | ICD-10-CM | POA: Diagnosis not present

## 2018-12-23 DIAGNOSIS — Z9181 History of falling: Secondary | ICD-10-CM | POA: Diagnosis not present

## 2018-12-23 DIAGNOSIS — Z96652 Presence of left artificial knee joint: Secondary | ICD-10-CM | POA: Diagnosis not present

## 2018-12-24 ENCOUNTER — Other Ambulatory Visit: Payer: Self-pay

## 2018-12-24 ENCOUNTER — Ambulatory Visit (INDEPENDENT_AMBULATORY_CARE_PROVIDER_SITE_OTHER): Payer: BC Managed Care – PPO | Admitting: Physician Assistant

## 2018-12-24 ENCOUNTER — Encounter: Payer: Self-pay | Admitting: Orthopaedic Surgery

## 2018-12-24 DIAGNOSIS — Z96652 Presence of left artificial knee joint: Secondary | ICD-10-CM

## 2018-12-24 MED ORDER — HYDROCODONE-ACETAMINOPHEN 5-325 MG PO TABS: 1.0000 | ORAL_TABLET | Freq: Two times a day (BID) | ORAL | 0 refills | Status: DC | PRN

## 2018-12-24 NOTE — Progress Notes (Signed)
   Post-Op Visit Note   Patient: Jodi Roberts           Date of Birth: April 11, 1990           MRN: [id number] Visit Date: 12/24/2018 PCP: Patient, No Pcp Per   Assessment & Plan:  Chief Complaint:  Chief Complaint  Patient presents with  . Left Knee - Follow-up, Routine Post Op   Visit Diagnoses:  1. S/P revision of total knee, left     Plan: Patient is a pleasant 28 year old female who presents our clinic today 2 weeks status post left total knee replacement, date of surgery 12/09/2018.  She has been doing well.  She is ambulating with a walker.  She finished home health physical therapy yesterday.  She is taking an occasional oxycodone.  No fevers or chills.  Examination of her left knee reveals a well-healing surgical incision without complication.  Calf is soft and nontender.  She is neurovascular intact distally.  At this point, Steri-Strips were reapplied.  We will transition her to outpatient physical therapy and a prescription was provided today for this.  Dental prophylaxis reinforced.  Follow-up with [place] in 4 weeks time for repeat evaluation 2 view x-rays of the left knee.  Call with concerns or questions.  Follow-Up Instructions: Return in about 4 weeks (around 01/21/2019).   Orders:  Orders Placed This Encounter  Procedures  . Ambulatory referral to Physical Therapy   Meds ordered this encounter  Medications  . HYDROcodone-acetaminophen (NORCO) 5-325 MG tablet    Sig: Take 1-2 tablets by mouth 2 (two) times daily as needed for moderate pain.    Dispense:  30 tablet    Refill:  0    Imaging: No new imaging  PMFS History: Patient Active Problem List   Diagnosis Date Noted  . Status post total left knee replacement 12/09/2018  . Degenerative arthritis of left knee 10/24/2018   Past Medical History:  Diagnosis Date  . Arthritis    osteoarthritis    Family History  Family history unknown: Yes    Past Surgical History:  Procedure Laterality Date  . KNEE  ARTHROSCOPY Right    reconstruction surgery  . KNEE CARTILAGE SURGERY Left   . LEG SURGERY Right    rods and screws  . TOTAL KNEE ARTHROPLASTY Left 12/09/2018  . TOTAL KNEE ARTHROPLASTY Left 12/09/2018   Procedure: LEFT TOTAL KNEE ARTHROPLASTY;  Surgeon: Leandrew Koyanagi, MD;  Location: [place];  Service: Orthopedics;  Laterality: Left;  . WISDOM TOOTH EXTRACTION     Social History   Occupational History  . Not on file  Tobacco Use  . Smoking status: Never Smoker  . Smokeless tobacco: Never Used  Substance and Sexual Activity  . Alcohol use: Never    Frequency: Never  . Drug use: Never  . Sexual activity: Not on file

## 2018-12-26 DIAGNOSIS — M25662 Stiffness of left knee, not elsewhere classified: Secondary | ICD-10-CM | POA: Diagnosis not present

## 2018-12-26 DIAGNOSIS — M25462 Effusion, left knee: Secondary | ICD-10-CM | POA: Diagnosis not present

## 2018-12-26 DIAGNOSIS — M25562 Pain in left knee: Secondary | ICD-10-CM | POA: Diagnosis not present

## 2018-12-26 DIAGNOSIS — M6282 Rhabdomyolysis: Secondary | ICD-10-CM | POA: Diagnosis not present

## 2018-12-31 DIAGNOSIS — M25662 Stiffness of left knee, not elsewhere classified: Secondary | ICD-10-CM | POA: Diagnosis not present

## 2018-12-31 DIAGNOSIS — M6282 Rhabdomyolysis: Secondary | ICD-10-CM | POA: Diagnosis not present

## 2018-12-31 DIAGNOSIS — M25562 Pain in left knee: Secondary | ICD-10-CM | POA: Diagnosis not present

## 2018-12-31 DIAGNOSIS — M25462 Effusion, left knee: Secondary | ICD-10-CM | POA: Diagnosis not present

## 2019-01-02 DIAGNOSIS — M25462 Effusion, left knee: Secondary | ICD-10-CM | POA: Diagnosis not present

## 2019-01-02 DIAGNOSIS — M25662 Stiffness of left knee, not elsewhere classified: Secondary | ICD-10-CM | POA: Diagnosis not present

## 2019-01-02 DIAGNOSIS — M6282 Rhabdomyolysis: Secondary | ICD-10-CM | POA: Diagnosis not present

## 2019-01-02 DIAGNOSIS — M25562 Pain in left knee: Secondary | ICD-10-CM | POA: Diagnosis not present

## 2019-01-03 DIAGNOSIS — M25562 Pain in left knee: Secondary | ICD-10-CM | POA: Diagnosis not present

## 2019-01-03 DIAGNOSIS — M6282 Rhabdomyolysis: Secondary | ICD-10-CM | POA: Diagnosis not present

## 2019-01-03 DIAGNOSIS — M25462 Effusion, left knee: Secondary | ICD-10-CM | POA: Diagnosis not present

## 2019-01-03 DIAGNOSIS — M25662 Stiffness of left knee, not elsewhere classified: Secondary | ICD-10-CM | POA: Diagnosis not present

## 2019-01-05 DIAGNOSIS — M6282 Rhabdomyolysis: Secondary | ICD-10-CM | POA: Diagnosis not present

## 2019-01-05 DIAGNOSIS — M25562 Pain in left knee: Secondary | ICD-10-CM | POA: Diagnosis not present

## 2019-01-05 DIAGNOSIS — M25662 Stiffness of left knee, not elsewhere classified: Secondary | ICD-10-CM | POA: Diagnosis not present

## 2019-01-05 DIAGNOSIS — M25462 Effusion, left knee: Secondary | ICD-10-CM | POA: Diagnosis not present

## 2019-01-06 DIAGNOSIS — M25662 Stiffness of left knee, not elsewhere classified: Secondary | ICD-10-CM | POA: Diagnosis not present

## 2019-01-06 DIAGNOSIS — M6282 Rhabdomyolysis: Secondary | ICD-10-CM | POA: Diagnosis not present

## 2019-01-06 DIAGNOSIS — M25462 Effusion, left knee: Secondary | ICD-10-CM | POA: Diagnosis not present

## 2019-01-06 DIAGNOSIS — M25562 Pain in left knee: Secondary | ICD-10-CM | POA: Diagnosis not present

## 2019-01-08 DIAGNOSIS — M25462 Effusion, left knee: Secondary | ICD-10-CM | POA: Diagnosis not present

## 2019-01-08 DIAGNOSIS — M6282 Rhabdomyolysis: Secondary | ICD-10-CM | POA: Diagnosis not present

## 2019-01-08 DIAGNOSIS — M25662 Stiffness of left knee, not elsewhere classified: Secondary | ICD-10-CM | POA: Diagnosis not present

## 2019-01-08 DIAGNOSIS — M25562 Pain in left knee: Secondary | ICD-10-CM | POA: Diagnosis not present

## 2019-01-13 ENCOUNTER — Telehealth: Payer: Self-pay

## 2019-01-13 NOTE — Telephone Encounter (Signed)
Patient would like hydrocodone sent to her pharm. [facility]   CB [phone]

## 2019-01-14 ENCOUNTER — Other Ambulatory Visit: Payer: Self-pay | Admitting: Physician Assistant

## 2019-01-14 DIAGNOSIS — M25662 Stiffness of left knee, not elsewhere classified: Secondary | ICD-10-CM | POA: Diagnosis not present

## 2019-01-14 DIAGNOSIS — M25562 Pain in left knee: Secondary | ICD-10-CM | POA: Diagnosis not present

## 2019-01-14 DIAGNOSIS — M6282 Rhabdomyolysis: Secondary | ICD-10-CM | POA: Diagnosis not present

## 2019-01-14 DIAGNOSIS — M25462 Effusion, left knee: Secondary | ICD-10-CM | POA: Diagnosis not present

## 2019-01-14 MED ORDER — HYDROCODONE-ACETAMINOPHEN 5-325 MG PO TABS: 1.0000 | ORAL_TABLET | Freq: Two times a day (BID) | ORAL | 0 refills | Status: DC | PRN

## 2019-01-14 NOTE — Telephone Encounter (Signed)
Called patient to advise her Rx was sent into pharm.

## 2019-01-14 NOTE — Telephone Encounter (Signed)
Just sent in

## 2019-01-16 DIAGNOSIS — M25462 Effusion, left knee: Secondary | ICD-10-CM | POA: Diagnosis not present

## 2019-01-16 DIAGNOSIS — M25562 Pain in left knee: Secondary | ICD-10-CM | POA: Diagnosis not present

## 2019-01-16 DIAGNOSIS — M6282 Rhabdomyolysis: Secondary | ICD-10-CM | POA: Diagnosis not present

## 2019-01-16 DIAGNOSIS — M25662 Stiffness of left knee, not elsewhere classified: Secondary | ICD-10-CM | POA: Diagnosis not present

## 2019-01-17 ENCOUNTER — Other Ambulatory Visit: Payer: Self-pay

## 2019-01-17 ENCOUNTER — Ambulatory Visit: Payer: BC Managed Care – PPO | Admitting: Orthopaedic Surgery

## 2019-01-17 ENCOUNTER — Encounter: Payer: Self-pay | Admitting: Orthopaedic Surgery

## 2019-01-17 ENCOUNTER — Ambulatory Visit (INDEPENDENT_AMBULATORY_CARE_PROVIDER_SITE_OTHER): Payer: BC Managed Care – PPO

## 2019-01-17 DIAGNOSIS — Z96652 Presence of left artificial knee joint: Secondary | ICD-10-CM

## 2019-01-17 NOTE — Progress Notes (Signed)
   Post-Op Visit Note   Patient: Jodi Roberts           Date of Birth: [birth year]           MRN: [id number] Visit Date: 01/17/2019 PCP: Patient, No Pcp Per   Assessment & Plan:  Chief Complaint:  Chief Complaint  Patient presents with  . Left Knee - Pain, Routine Post Op   Visit Diagnoses:  1. S/P revision of total knee, left     Plan: Patient is a pleasant 28 year old female who presents our clinic today 39 days status post left total knee replacement, date of surgery 12/09/2018.  She has been doing well.  She is in physical therapy making good progress.  She still notes a little bit of decreased sensation to the medial knee.  Examination of the left knee reveals a fully healed surgical scar.  No complications.  Calf is soft nontender.  Range of motion 5 to 95 degrees.  Stable to valgus varus stress.  She is neurovascular intact distally.  At this point, she will continue with physical therapy.  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  Follow-up with [place] in 6 weeks time for recheck.  Dental prophylaxis reinforced.  Follow-Up Instructions: Return in about 6 weeks (around 02/28/2019).   Orders:  Orders Placed This Encounter  Procedures  . XR Knee 1-2 Views Left   No orders of the defined types were placed in this encounter.   Imaging: Xr Knee 1-2 Views Left  Result Date: 01/17/2019 Well-seated prosthesis without complication   PMFS History: Patient Active Problem List   Diagnosis Date Noted  . Status post total left knee replacement 12/09/2018  . Degenerative arthritis of left knee 10/24/2018   Past Medical History:  Diagnosis Date  . Arthritis    osteoarthritis    Family History  Family history unknown: Yes    Past Surgical History:  Procedure Laterality Date  . KNEE ARTHROSCOPY Right    reconstruction surgery  . KNEE CARTILAGE SURGERY Left   . LEG SURGERY Right    rods and screws  . TOTAL KNEE  ARTHROPLASTY Left 12/09/2018  . TOTAL KNEE ARTHROPLASTY Left 12/09/2018   Procedure: LEFT TOTAL KNEE ARTHROPLASTY;  Surgeon: Leandrew Koyanagi, MD;  Location: [place];  Service: Orthopedics;  Laterality: Left;  . WISDOM TOOTH EXTRACTION     Social History   Occupational History  . Not on file  Tobacco Use  . Smoking status: Never Smoker  . Smokeless tobacco: Never Used  Substance and Sexual Activity  . Alcohol use: Never    Frequency: Never  . Drug use: Never  . Sexual activity: Not on file

## 2019-01-21 ENCOUNTER — Ambulatory Visit: Payer: BC Managed Care – PPO | Admitting: Orthopaedic Surgery

## 2019-01-21 DIAGNOSIS — M25462 Effusion, left knee: Secondary | ICD-10-CM | POA: Diagnosis not present

## 2019-01-21 DIAGNOSIS — M6282 Rhabdomyolysis: Secondary | ICD-10-CM | POA: Diagnosis not present

## 2019-01-21 DIAGNOSIS — M25662 Stiffness of left knee, not elsewhere classified: Secondary | ICD-10-CM | POA: Diagnosis not present

## 2019-01-21 DIAGNOSIS — M25562 Pain in left knee: Secondary | ICD-10-CM | POA: Diagnosis not present

## 2019-01-28 DIAGNOSIS — M25562 Pain in left knee: Secondary | ICD-10-CM | POA: Diagnosis not present

## 2019-01-28 DIAGNOSIS — M6282 Rhabdomyolysis: Secondary | ICD-10-CM | POA: Diagnosis not present

## 2019-01-28 DIAGNOSIS — M25662 Stiffness of left knee, not elsewhere classified: Secondary | ICD-10-CM | POA: Diagnosis not present

## 2019-01-28 DIAGNOSIS — M25462 Effusion, left knee: Secondary | ICD-10-CM | POA: Diagnosis not present

## 2019-02-01 ENCOUNTER — Other Ambulatory Visit: Payer: Self-pay | Admitting: Orthopaedic Surgery

## 2019-02-03 NOTE — Telephone Encounter (Signed)
Only needs 6 weeks po

## 2019-02-05 ENCOUNTER — Telehealth: Payer: Self-pay | Admitting: Orthopaedic Surgery

## 2019-02-05 NOTE — Telephone Encounter (Signed)
See message.

## 2019-02-05 NOTE — Telephone Encounter (Signed)
Pt called in requesting a call back from someone regarding her upcoming appt 03-04-2019 said she has some things to go over with dr.xu or lindsay but would like to only address it to them.   [phone]

## 2019-02-05 NOTE — Telephone Encounter (Signed)
Spoke to patient

## 2019-03-04 ENCOUNTER — Encounter: Payer: Self-pay | Admitting: Orthopaedic Surgery

## 2019-03-04 ENCOUNTER — Ambulatory Visit (INDEPENDENT_AMBULATORY_CARE_PROVIDER_SITE_OTHER): Payer: BC Managed Care – PPO | Admitting: Orthopaedic Surgery

## 2019-03-04 ENCOUNTER — Other Ambulatory Visit (HOSPITAL_COMMUNITY)
Admission: RE | Admit: 2019-03-04 | Discharge: 2019-03-04 | Disposition: A | Discharge status: Home / Self Care | Payer: BC Managed Care – PPO | Source: Ambulatory Visit | Attending: Orthopaedic Surgery | Admitting: Orthopaedic Surgery

## 2019-03-04 ENCOUNTER — Other Ambulatory Visit: Payer: Self-pay

## 2019-03-04 DIAGNOSIS — Z01812 Encounter for preprocedural laboratory examination: Secondary | ICD-10-CM | POA: Insufficient documentation

## 2019-03-04 DIAGNOSIS — Z96652 Presence of left artificial knee joint: Secondary | ICD-10-CM | POA: Insufficient documentation

## 2019-03-04 DIAGNOSIS — Z20822 Contact with and (suspected) exposure to covid-19: Secondary | ICD-10-CM | POA: Insufficient documentation

## 2019-03-04 NOTE — Progress Notes (Signed)
   Post-Op Visit Note   Patient: Jodi Roberts           Date of Birth: [birth year]           MRN: [id number] Visit Date: 03/04/2019 PCP: Patient, No Pcp Per   Assessment & Plan:  Chief Complaint:  Chief Complaint  Patient presents with  . Left Knee - Pain, Routine Post Op   Visit Diagnoses:  1. S/P TKR (total knee replacement), left     Plan: Patient is a pleasant 29 year old female who comes in today 12 weeks out left total knee replacement, date of surgery 12/09/2018.  She has been doing well.  She has been doing virtual physical therapy for the past month as she has been in [place] helping with her dad following the surgery that he underwent.  She feels as though she has slowly progressed with range of motion.  She denies much pain.  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  Examination of her left knee reveals a fully healed surgical scar without complication.  Range of motion 5 degrees of extension lag to approximately 95 degrees of flexion.  Stable to valgus varus stress.  Calf is soft and nontender.  She is neurovascular intact distally.  At this point,  Follow-Up Instructions: Return in about 9 months (around 12/02/2019).   Orders:  No orders of the defined types were placed in this encounter.  No orders of the defined types were placed in this encounter.   Imaging: No new imaging  PMFS History: Patient Active Problem List   Diagnosis Date Noted  . S/P TKR (total knee replacement), left 03/04/2019  . Status post total left knee replacement 12/09/2018  . Degenerative arthritis of left knee 10/24/2018   Past Medical History:  Diagnosis Date  . Arthritis    osteoarthritis    Family History  Family history unknown: Yes    Past Surgical History:  Procedure Laterality Date  . KNEE ARTHROSCOPY Right    reconstruction surgery  . KNEE CARTILAGE SURGERY Left   . LEG SURGERY Right    rods and screws    . TOTAL KNEE ARTHROPLASTY Left 12/09/2018  . TOTAL KNEE ARTHROPLASTY Left 12/09/2018   Procedure: LEFT TOTAL KNEE ARTHROPLASTY;  Surgeon: Tarry Kos, MD;  [facility];  Service: Orthopedics;  Laterality: Left;  . WISDOM TOOTH EXTRACTION     Social History   Occupational History  . Not on file  Tobacco Use  . Smoking status: Never Smoker  . Smokeless tobacco: Never Used  Substance and Sexual Activity  . Alcohol use: Never  . Drug use: Never  . Sexual activity: Not on file

## 2019-03-05 ENCOUNTER — Other Ambulatory Visit: Payer: Self-pay

## 2019-03-05 LAB — NOVEL CORONAVIRUS, NAA (HOSP ORDER, SEND-OUT TO REF LAB; TAT 18-24 HRS): SARS-CoV-2, NAA: NOT DETECTED

## 2019-03-06 ENCOUNTER — Other Ambulatory Visit: Payer: Self-pay

## 2019-03-07 ENCOUNTER — Ambulatory Visit (HOSPITAL_BASED_OUTPATIENT_CLINIC_OR_DEPARTMENT_OTHER): Payer: BC Managed Care – PPO | Admitting: Anesthesiology

## 2019-03-07 ENCOUNTER — Other Ambulatory Visit: Payer: Self-pay | Admitting: Orthopaedic Surgery

## 2019-03-07 ENCOUNTER — Ambulatory Visit (HOSPITAL_BASED_OUTPATIENT_CLINIC_OR_DEPARTMENT_OTHER)
Admission: RE | Admit: 2019-03-07 | Discharge: 2019-03-07 | Disposition: A | Discharge status: Home / Self Care | Payer: BC Managed Care – PPO | Attending: Orthopaedic Surgery | Admitting: Orthopaedic Surgery

## 2019-03-07 ENCOUNTER — Encounter (HOSPITAL_BASED_OUTPATIENT_CLINIC_OR_DEPARTMENT_OTHER)
Admission: RE | Disposition: A | Discharge status: Home / Self Care | Payer: Self-pay | Source: Home / Self Care | Attending: Orthopaedic Surgery

## 2019-03-07 ENCOUNTER — Ambulatory Visit (HOSPITAL_COMMUNITY): Payer: BC Managed Care – PPO

## 2019-03-07 ENCOUNTER — Encounter (HOSPITAL_BASED_OUTPATIENT_CLINIC_OR_DEPARTMENT_OTHER): Payer: Self-pay | Admitting: Orthopaedic Surgery

## 2019-03-07 ENCOUNTER — Other Ambulatory Visit: Payer: Self-pay

## 2019-03-07 DIAGNOSIS — Z96652 Presence of left artificial knee joint: Secondary | ICD-10-CM

## 2019-03-07 DIAGNOSIS — Z471 Aftercare following joint replacement surgery: Secondary | ICD-10-CM | POA: Diagnosis not present

## 2019-03-07 DIAGNOSIS — Z6836 Body mass index (BMI) 36.0-36.9, adult: Secondary | ICD-10-CM | POA: Insufficient documentation

## 2019-03-07 DIAGNOSIS — M1712 Unilateral primary osteoarthritis, left knee: Secondary | ICD-10-CM | POA: Diagnosis not present

## 2019-03-07 DIAGNOSIS — Z79899 Other long term (current) drug therapy: Secondary | ICD-10-CM | POA: Diagnosis not present

## 2019-03-07 DIAGNOSIS — M24662 Ankylosis, left knee: Secondary | ICD-10-CM | POA: Insufficient documentation

## 2019-03-07 DIAGNOSIS — M1990 Unspecified osteoarthritis, unspecified site: Secondary | ICD-10-CM | POA: Insufficient documentation

## 2019-03-07 DIAGNOSIS — M25462 Effusion, left knee: Secondary | ICD-10-CM | POA: Diagnosis not present

## 2019-03-07 DIAGNOSIS — T8482XS Fibrosis due to internal orthopedic prosthetic devices, implants and grafts, sequela: Secondary | ICD-10-CM | POA: Diagnosis not present

## 2019-03-07 DIAGNOSIS — E669 Obesity, unspecified: Secondary | ICD-10-CM | POA: Diagnosis not present

## 2019-03-07 HISTORY — PX: KNEE CLOSED REDUCTION: SHX995

## 2019-03-07 LAB — POCT PREGNANCY, URINE: Preg Test, Ur: NEGATIVE

## 2019-03-07 SURGERY — MANIPULATION, KNEE, CLOSED
Anesthesia: Monitor Anesthesia Care | Site: Knee | Laterality: Left

## 2019-03-07 MED ORDER — FENTANYL CITRATE (PF) 100 MCG/2ML IJ SOLN
INTRAMUSCULAR | Status: AC
  Filled 2019-03-07: qty 2

## 2019-03-07 MED ORDER — MIDAZOLAM HCL 2 MG/2ML IJ SOLN
INTRAMUSCULAR | Status: DC | PRN
  Administered 2019-03-07: 2 mg via INTRAVENOUS

## 2019-03-07 MED ORDER — LACTATED RINGERS IV SOLN: INTRAVENOUS | Status: DC

## 2019-03-07 MED ORDER — CHLORHEXIDINE GLUCONATE 4 % EX LIQD: 60.0000 mL | Freq: Once | CUTANEOUS | Status: DC

## 2019-03-07 MED ORDER — MIDAZOLAM HCL 2 MG/2ML IJ SOLN: 1.0000 mg | INTRAMUSCULAR | Status: DC | PRN

## 2019-03-07 MED ORDER — HYDROCODONE-ACETAMINOPHEN 5-325 MG PO TABS: 1.0000 | ORAL_TABLET | Freq: Two times a day (BID) | ORAL | 0 refills | Status: DC | PRN

## 2019-03-07 MED ORDER — LIDOCAINE HCL (CARDIAC) PF 100 MG/5ML IV SOSY
PREFILLED_SYRINGE | INTRAVENOUS | Status: DC | PRN
  Administered 2019-03-07: 60 mg via INTRAVENOUS

## 2019-03-07 MED ORDER — FENTANYL CITRATE (PF) 100 MCG/2ML IJ SOLN: 50.0000 ug | INTRAMUSCULAR | Status: DC | PRN

## 2019-03-07 MED ORDER — FENTANYL CITRATE (PF) 100 MCG/2ML IJ SOLN
INTRAMUSCULAR | Status: DC | PRN
  Administered 2019-03-07: 100 ug via INTRAVENOUS

## 2019-03-07 MED ORDER — OXYCODONE HCL 5 MG/5ML PO SOLN: 5.0000 mg | Freq: Once | ORAL | Status: DC | PRN

## 2019-03-07 MED ORDER — PROPOFOL 10 MG/ML IV BOLUS
INTRAVENOUS | Status: DC | PRN
  Administered 2019-03-07: 30 mg via INTRAVENOUS
  Administered 2019-03-07: 20 mg via INTRAVENOUS
  Administered 2019-03-07: 40 mg via INTRAVENOUS
  Administered 2019-03-07 (×3): 20 mg via INTRAVENOUS

## 2019-03-07 MED ORDER — LIDOCAINE 2% (20 MG/ML) 5 ML SYRINGE
INTRAMUSCULAR | Status: AC
  Filled 2019-03-07: qty 5

## 2019-03-07 MED ORDER — MIDAZOLAM HCL 2 MG/2ML IJ SOLN
INTRAMUSCULAR | Status: AC
  Filled 2019-03-07: qty 2

## 2019-03-07 MED ORDER — BUPIVACAINE HCL (PF) 0.5 % IJ SOLN
INTRAMUSCULAR | Status: DC | PRN
  Administered 2019-03-07: 10 mL

## 2019-03-07 MED ORDER — PROPOFOL 500 MG/50ML IV EMUL
INTRAVENOUS | Status: AC
  Filled 2019-03-07: qty 50

## 2019-03-07 MED ORDER — PROMETHAZINE HCL 25 MG/ML IJ SOLN: 6.2500 mg | INTRAMUSCULAR | Status: DC | PRN

## 2019-03-07 MED ORDER — OXYCODONE HCL 5 MG PO TABS: 5.0000 mg | ORAL_TABLET | Freq: Once | ORAL | Status: DC | PRN

## 2019-03-07 MED ORDER — FENTANYL CITRATE (PF) 100 MCG/2ML IJ SOLN
25.0000 ug | INTRAMUSCULAR | Status: DC | PRN
  Administered 2019-03-07: 50 ug via INTRAVENOUS

## 2019-03-07 SURGICAL SUPPLY — 14 items
GLOVE BIOGEL PI IND STRL 7.0 (GLOVE) IMPLANT
GLOVE BIOGEL PI INDICATOR 7.0 (GLOVE)
GLOVE SKINSENSE NS SZ7.5 (GLOVE)
GLOVE SKINSENSE STRL SZ7.5 (GLOVE) IMPLANT
GOWN STRL REIN XL XLG (GOWN DISPOSABLE) IMPLANT
GOWN STRL REUS W/ TWL XL LVL3 (GOWN DISPOSABLE) IMPLANT
GOWN STRL REUS W/TWL XL LVL3 (GOWN DISPOSABLE)
NDL SAFETY ECLIPSE 18X1.5 (NEEDLE) ×1 IMPLANT
NEEDLE HYPO 18GX1.5 SHARP (NEEDLE) ×2
NEEDLE HYPO 22GX1.5 SAFETY (NEEDLE) ×3 IMPLANT
PAD ALCOHOL SWAB (MISCELLANEOUS) ×15 IMPLANT
SYR 20ML LL LF (SYRINGE) IMPLANT
SYR CONTROL 10ML LL (SYRINGE) ×3 IMPLANT
TOWEL GREEN STERILE FF (TOWEL DISPOSABLE) ×3 IMPLANT

## 2019-03-07 NOTE — Anesthesia Postprocedure Evaluation (Signed)
Anesthesia Post Note  Patient: Jodi Roberts  Procedure(s) Performed: CLOSED MANIPULATION LEFT  KNEE (Left Knee)     Patient location during evaluation: PACU Anesthesia Type: MAC Level of consciousness: awake and alert Pain management: pain level controlled Vital Signs Assessment: post-procedure vital signs reviewed and stable Respiratory status: spontaneous breathing, nonlabored ventilation and respiratory function stable Cardiovascular status: stable and blood pressure returned to baseline Anesthetic complications: no    Last Vitals:  Vitals:   03/07/19 1245 03/07/19 1315  BP: (!) 115/102 126/90  Pulse: 83 84  Resp: 19 20  Temp:  37.1 C  SpO2: 99% 100%    Last Pain:  Vitals:   03/07/19 1315  TempSrc: Oral  PainSc: 0-No pain                 Audry Pili

## 2019-03-07 NOTE — Discharge Instructions (Signed)

## 2019-03-07 NOTE — Transfer of Care (Signed)
Immediate Anesthesia Transfer of Care Note  Patient: Jodi Roberts  Procedure(s) Performed: CLOSED MANIPULATION LEFT  KNEE (Left Knee)  Patient Location: PACU  Anesthesia Type:MAC  Level of Consciousness: awake, alert , oriented, drowsy and patient cooperative  Airway & Oxygen Therapy: Patient Spontanous Breathing and Patient connected to face mask oxygen  Post-op Assessment: Report given to RN and Post -op Vital signs reviewed and stable  Post vital signs: Reviewed and stable  Last Vitals:  Vitals Value Taken Time  BP 141/90 03/07/19 1215  Temp    Pulse 99 03/07/19 1219  Resp 26 03/07/19 1219  SpO2 96 % 03/07/19 1219  Vitals shown include unvalidated device data.  Last Pain:  Vitals:   03/07/19 1215  TempSrc:   PainSc: (P) Asleep      Patients Stated Pain Goal: 6 (53/66/44 0347)  Complications: No apparent anesthesia complications

## 2019-03-07 NOTE — H&P (Signed)
PREOPERATIVE H&P  Chief Complaint: left knee stiffness  HPI: Jodi Roberts is a 29 y.o. female who presents for surgical treatment of left knee stiffness.  She denies any changes in medical history.  Past Medical History:  Diagnosis Date  . Arthritis    osteoarthritis   Past Surgical History:  Procedure Laterality Date  . KNEE ARTHROSCOPY Right    reconstruction surgery  . KNEE CARTILAGE SURGERY Left   . LEG SURGERY Right    rods and screws  . TOTAL KNEE ARTHROPLASTY Left 12/09/2018  . TOTAL KNEE ARTHROPLASTY Left 12/09/2018   Procedure: LEFT TOTAL KNEE ARTHROPLASTY;  Surgeon: Leandrew Koyanagi, MD;  Location: [place];  Service: Orthopedics;  Laterality: Left;  . WISDOM TOOTH EXTRACTION     Social History   Socioeconomic History  . Marital status: Single    Spouse name: Not on file  . Number of children: Not on file  . Years of education: Not on file  . Highest education level: Not on file  Occupational History  . Not on file  Tobacco Use  . Smoking status: Never Smoker  . Smokeless tobacco: Never Used  Substance and Sexual Activity  . Alcohol use: Never  . Drug use: Never  . Sexual activity: Not on file  Other Topics Concern  . Not on file  Social History Narrative  . Not on file   Social Determinants of Health   Financial Resource Strain:   . Difficulty of Paying Living Expenses: Not on file  Food Insecurity:   . Worried About Charity fundraiser in the Last Year: Not on file  . Ran Out of Food in the Last Year: Not on file  Transportation Needs:   . Lack of Transportation (Medical): Not on file  . Lack of Transportation (Non-Medical): Not on file  Physical Activity:   . Days of Exercise per Week: Not on file  . Minutes of Exercise per Session: Not on file  Stress:   . Feeling of Stress : Not on file  Social Connections:   . Frequency of Communication with Friends and Family: Not on file  . Frequency of Social Gatherings with Friends and Family: Not on  file  . Attends Religious Services: Not on file  . Active Member of Clubs or Organizations: Not on file  . Attends Archivist Meetings: Not on file  . Marital Status: Not on file   Family History  Family history unknown: Yes   Allergies  Allergen Reactions  . Shellfish Allergy Anaphylaxis and Swelling    Throat closes Only with eating.  Ok to touch iodine or betadine.   Prior to Admission medications   Medication Sig Start Date End Date Taking? Authorizing Provider  cetirizine (ZYRTEC) 10 MG tablet Take 1 tablet (10 mg total) by mouth daily. Patient taking differently: Take 10 mg by mouth daily as needed (during [place] for allergies.).  05/14/18  Yes Raylene Everts, MD  HYDROcodone-acetaminophen [facility]) 5-325 MG tablet Take 1-2 tablets by mouth 2 (two) times daily as needed for moderate pain. 01/14/19  Yes Aundra Dubin, PA-C  ketorolac (TORADOL) 10 MG tablet Take 1 tablet (10 mg total) by mouth 2 (two) times daily as needed. 12/09/18  Yes Leandrew Koyanagi, MD  methocarbamol (ROBAXIN) 750 MG tablet Take 1 tablet (750 mg total) by mouth 2 (two) times daily as needed for muscle spasms. 12/09/18  Yes Leandrew Koyanagi, MD  albuterol (PROVENTIL HFA;VENTOLIN HFA) 108 (  90 Base) MCG/ACT inhaler Inhale 1-2 puffs into the lungs every 6 (six) hours as needed for wheezing or shortness of breath. 05/14/18   Eustace Moore, MD  ondansetron (ZOFRAN) 4 MG tablet Take 1-2 tablets (4-8 mg total) by mouth every 8 (eight) hours as needed for nausea or vomiting. 12/09/18   Tarry Kos, MD     Positive ROS: All other systems have been reviewed and were otherwise negative with the exception of those mentioned in the HPI and as above.  Physical Exam: General: Alert, no acute distress Cardiovascular: No pedal edema Respiratory: No cyanosis, no use of accessory musculature GI: abdomen soft Skin: No lesions in the area of chief complaint Neurologic: Sensation intact distally  Psychiatric: Patient is competent for consent with normal mood and affect Lymphatic: no lymphedema  MUSCULOSKELETAL: exam stable  Assessment: left knee stiffness  Plan: Plan for Procedure(s): CLOSED MANIPULATION LEFT  KNEE  The risks benefits and alternatives were discussed with the patient including but not limited to the risks of nonoperative treatment, versus surgical intervention including infection, bleeding, nerve injury,  blood clots, cardiopulmonary complications, morbidity, mortality, among others, and they were willing to proceed.   Glee Arvin, MD   03/07/2019 8:08 AM

## 2019-03-07 NOTE — Op Note (Signed)
   Date of Surgery: 03/07/2019  INDICATIONS: Ms. Guimond is a 29 y.o.-year-old female with arthrofibrosis of the left knee.  The patient did consent to the procedure after discussion of the risks and benefits.  PREOPERATIVE DIAGNOSIS: Arthrofibrosis of left knee  POSTOPERATIVE DIAGNOSIS: Same.  PROCEDURE:  1. Manipulation of left knee joint under anesthesia 2. Injection of left knee joint with 10 cc 0.25% bupivacaine  SURGEON: N. Eduard Roux, M.D.  ASSIST: Madalyn Rob, [place]  ANESTHESIA:  MAC anesthesia  IV FLUIDS AND URINE: See anesthesia.  COMPLICATIONS: see description of procedure.  DESCRIPTION OF PROCEDURE: The patient was brought to the operating room and remained supine on the stretcher.  The patient had been signed prior to the procedure and this was documented. The patient had the anesthesia placed by the anesthesiologist.  A time-out was performed to confirm that this was the correct patient, site, side and location. No antibiotics were indicated to be given. Manipulation of the left knee joint was then performed with audible tearing of adhesions within the knee joint.  The flexion improved from 90 degrees to 125 degrees.  I then injected 10 cc of 0.25% bupivacaine into the knee joint under sterile conditions.  Band aid was applied.  Patient tolerated the procedure well and had no immediate complications.  Azucena Cecil, MD [phone] 12:24 PM

## 2019-03-07 NOTE — Anesthesia Preprocedure Evaluation (Addendum)
Anesthesia Evaluation  Patient identified by MRN, date of birth, ID band Patient awake    Reviewed: Allergy & Precautions, NPO status , Patient's Chart, lab work & pertinent test results  History of Anesthesia Complications Negative for: history of anesthetic complications  Airway Mallampati: II  TM Distance: >3 FB Neck ROM: Full    Dental  (+) Dental Advisory Given, Loose, Caps,    Pulmonary neg pulmonary ROS,    Pulmonary exam normal        Cardiovascular negative cardio ROS Normal cardiovascular exam     Neuro/Psych negative neurological ROS  negative psych ROS   GI/Hepatic negative GI ROS, Neg liver ROS,   Endo/Other   Obesity   Renal/GU negative Renal ROS     Musculoskeletal  (+) Arthritis ,   Abdominal   Peds  Hematology negative hematology ROS (+)   Anesthesia Other Findings Covid neg 1/19   Reproductive/Obstetrics                            Anesthesia Physical Anesthesia Plan  ASA: II  Anesthesia Plan: MAC   Post-op Pain Management:    Induction: Intravenous  PONV Risk Score and Plan: 2 and Propofol infusion and Treatment may vary due to age or medical condition  Airway Management Planned: Natural Airway and Simple Face Mask  Additional Equipment: None  Intra-op Plan:   Post-operative Plan:   Informed Consent: I have reviewed the patients History and Physical, chart, labs and discussed the procedure including the risks, benefits and alternatives for the proposed anesthesia with the patient or authorized representative who has indicated his/her understanding and acceptance.       Plan Discussed with: CRNA and Anesthesiologist  Anesthesia Plan Comments:        Anesthesia Quick Evaluation

## 2019-03-10 ENCOUNTER — Other Ambulatory Visit: Payer: Self-pay

## 2019-03-10 ENCOUNTER — Ambulatory Visit (INDEPENDENT_AMBULATORY_CARE_PROVIDER_SITE_OTHER): Payer: BC Managed Care – PPO | Admitting: Physical Therapy

## 2019-03-10 ENCOUNTER — Other Ambulatory Visit: Payer: Self-pay | Admitting: Physician Assistant

## 2019-03-10 ENCOUNTER — Telehealth: Payer: Self-pay | Admitting: Radiology

## 2019-03-10 ENCOUNTER — Encounter: Payer: Self-pay | Admitting: Physical Therapy

## 2019-03-10 DIAGNOSIS — M6281 Muscle weakness (generalized): Secondary | ICD-10-CM

## 2019-03-10 DIAGNOSIS — M25562 Pain in left knee: Secondary | ICD-10-CM

## 2019-03-10 DIAGNOSIS — M25662 Stiffness of left knee, not elsewhere classified: Secondary | ICD-10-CM

## 2019-03-10 DIAGNOSIS — R2689 Other abnormalities of gait and mobility: Secondary | ICD-10-CM

## 2019-03-10 MED ORDER — AMOXICILLIN 500 MG PO CAPS: ORAL_CAPSULE | ORAL | 0 refills | Status: DC

## 2019-03-10 NOTE — Telephone Encounter (Signed)
Called patient to let her know Rx was called into pharm
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
## 2019-03-10 NOTE — Telephone Encounter (Signed)
I sent in.  Can you let her know

## 2019-03-10 NOTE — Patient Instructions (Signed)
Access Code: [email or web address]  URL: [email or web address]  Date: 03/10/2019  Prepared by: Ivery Quale   Exercises  Standing Knee Flexion Stretch on Step - 10 reps - 1 sets - 10 hold - 2x daily - 6x weekly  Supine Hamstring Stretch with Strap - 3 sets - 30 hold - 2x daily - 6x weekly  Supine Heel Slide with Strap - 10 reps - 2-3 sets - 5 hold - 2x daily - 6x weekly  Supine Active Straight Leg Raise - 10 reps - 1-3 sets - 2x daily - 6x weekly  Seated Long Arc Quad - 10 reps - 2-3 sets - 2x daily - 6x weekly  Standing Hip Abduction - 10 reps - 2-3 sets - 2x daily - 6x weekly  Mini Squat with Counter Support - 10 reps - 1-2 sets - 2x daily - 6x weekly

## 2019-03-10 NOTE — Telephone Encounter (Signed)
Patient requests antibiotic be called in to pharmacy for dental appt that she has Wednesday.Please send rx to [facility] at [facility] and [address]. Please call patient to let her know when rx has been sent in.  CB for pt [phone]

## 2019-03-11 ENCOUNTER — Encounter: Payer: Self-pay | Admitting: Physical Therapy

## 2019-03-11 ENCOUNTER — Ambulatory Visit (INDEPENDENT_AMBULATORY_CARE_PROVIDER_SITE_OTHER): Payer: BC Managed Care – PPO | Admitting: Physical Therapy

## 2019-03-11 ENCOUNTER — Ambulatory Visit: Payer: BC Managed Care – PPO | Admitting: Physical Therapy

## 2019-03-11 DIAGNOSIS — R2689 Other abnormalities of gait and mobility: Secondary | ICD-10-CM | POA: Diagnosis not present

## 2019-03-11 DIAGNOSIS — M25662 Stiffness of left knee, not elsewhere classified: Secondary | ICD-10-CM

## 2019-03-11 DIAGNOSIS — M6281 Muscle weakness (generalized): Secondary | ICD-10-CM | POA: Diagnosis not present

## 2019-03-11 DIAGNOSIS — M25562 Pain in left knee: Secondary | ICD-10-CM

## 2019-03-11 NOTE — Therapy (Signed)
[place], [place], [address] Phone: [phone]   Fax:  [phone]  Physical Therapy Treatment  Patient Details  Name: Jodi Roberts MRN: [id number] Date of Birth: [birth year] Referring Provider (PT): Susy Manor MD   Encounter Date: 03/11/2019  PT End of Session - 03/11/19 0938    Visit Number  2    Number of Visits  15    Date for PT Re-Evaluation  04/21/19    Authorization Type  BCBS    PT Start Time  0932    PT Stop Time  1023    PT Time Calculation (min)  51 min    Activity Tolerance  Patient tolerated treatment well    Behavior During Therapy  [employer] for tasks assessed/performed       Past Medical History:  Diagnosis Date  . Arthritis    osteoarthritis    Past Surgical History:  Procedure Laterality Date  . KNEE ARTHROSCOPY Right    reconstruction surgery  . KNEE CARTILAGE SURGERY Left   . KNEE CLOSED REDUCTION Left 03/07/2019   Procedure: CLOSED MANIPULATION LEFT  KNEE;  Surgeon: Leandrew Koyanagi, MD;  Location: [facility];  Service: Orthopedics;  Laterality: Left;  . LEG SURGERY Right    rods and screws  . TOTAL KNEE ARTHROPLASTY Left 12/09/2018  . TOTAL KNEE ARTHROPLASTY Left 12/09/2018   Procedure: LEFT TOTAL KNEE ARTHROPLASTY;  Surgeon: Leandrew Koyanagi, MD;  Location: [facility];  Service: Orthopedics;  Laterality: Left;  . WISDOM TOOTH EXTRACTION      There were no vitals filed for this visit.  Subjective Assessment - 03/11/19 0936    Subjective  Pt arriving to therapy reporting 4/10 Left knee pain. Pt with noted increased swelling today.    Limitations  Lifting;Standing;Walking    Patient Stated Goals  get her ROM back so she can put on her shoes, walk down steps    Currently in Pain?  Yes    Pain Score  4     Pain Location  Knee    Pain Orientation  Left    Pain Descriptors / Indicators  Aching    Pain Type  Surgical pain    Pain Onset  More than a month ago    Pain  Frequency  Intermittent    Aggravating Factors   bending, squatting, walking, prolonged standing         [facility] PT Assessment - 03/11/19 0001      Assessment   Medical Diagnosis  Lt TKA 12/09/18, then required manipulation 03/07/19    Referring Provider (PT)  Susy Manor MD    Next MD Visit  03/21/19    Prior Therapy  virtural PT      AROM   AROM Assessment Site  Knee    Right/Left Knee  Left    Left Knee Extension  5    Left Knee Flexion  90      PROM   PROM Assessment Site  Knee    Right/Left Knee  Left    Left Knee Extension  3    Left Knee Flexion  95                   [facility] Adult PT Treatment/Exercise - 03/11/19 0001      Exercises   Exercises  Knee/Hip      Knee/Hip Exercises: Stretches   Passive Hamstring Stretch  Left;2 reps;30 seconds  Quad Stretch  Left;2 reps;30 seconds    Lobbyist Limitations  passive    Knee: Self-Stretch Limitations  lunge stretch for fleixon with Lt foot on 8 inch step 10 sec X 5 reps    Gastroc Stretch  3 reps;20 seconds      Knee/Hip Exercises: Aerobic   Stationary Bike  L1, unable to reach full revolution with seat at 11      Knee/Hip Exercises: Standing   Heel Raises  Both;10 reps    Other Standing Knee Exercises  mini squats x 15 using back of chair for support      Knee/Hip Exercises: Supine   Quad Sets  Left;10 reps;Limitations    Short Arc Quad Sets  10 reps;Left    Heel Slides  AROM;Left;2 sets;10 reps;Limitations    Straight Leg Raises  Strengthening;Left;3 sets;5 reps;Limitations    Straight Leg Raises Limitations  5 degree extensor lag noted, pt requiring rest breaks due to fatigue      Knee/Hip Exercises: Prone   Hamstring Curl Limitations  hamstring curl with strap to increase knee fleixon on L holding 30 seconds x 5 reps      Manual Therapy   Manual Therapy  Joint mobilization    Manual therapy comments  knee flexion and extension with osscilitation  and overpressure with extension.      Joint Mobilization  patella mobs, Grade II- knee mobs for flexion and extension                  PT Long Term Goals - 03/11/19 1005      PT LONG TERM GOAL #1   Title  Pt will be I and compliant with HEP. (target for all goals 6 weeks 04/21/19)    Status  On-going      PT LONG TERM GOAL #2   Title  Pt will report less than 3/10 overall pain with usual activity and return to work duties    Status  On-going      PT LONG TERM GOAL #3   Title  Pt will improve Lt knee ROM to 0-110 to improve overall function.    Status  New      PT LONG TERM GOAL #4   Title  Pt will be able to ambulate community distances and stairs WFL gait pattern without complaints    Status  New            Plan - 03/11/19 0940    Clinical Impression Statement  Pt arriving to therapy reporting 4/10 pain in her Left knee. PROM arc 3-95 degrees with increased pain. Pt edu in importance of pushing through pain and soreness for increased ROM to regain function. Pt with 5 degree extensor lag. Pt instructed to continue to work on SLR, and quad set to [employer]. Sit to stand was also encouraged.    Personal Factors and Comorbidities  Time since onset of injury/illness/exacerbation;Past/Current Experience    Examination-Activity Limitations  Bend;Squat;Stairs;Stand;Lift;Locomotion Level    Examination-Participation Restrictions  Cleaning;Community Activity;Shop;Laundry;Other    Stability/Clinical Decision Making  Evolving/Moderate complexity    Rehab Potential  Fair    PT Frequency  Other (comment)    PT Duration  4 weeks    PT Treatment/Interventions  ADLs/Self Care Home Management;Aquatic Therapy;Cryotherapy;Electrical Stimulation;Moist Heat;Ultrasound;Gait training;Stair training;Functional mobility training;Therapeutic activities;Therapeutic exercise;Balance training;Neuromuscular re-education;Manual techniques;Passive range of motion;Dry needling;Joint Manipulations;Taping    PT Next Visit Plan   needs aggressive knee ROM, quad/hip strength and gait/standing activity  PT Home Exercise Plan  Access Code: [email or web address]    Consulted and Agree with Plan of Care  Patient       Patient will benefit from skilled therapeutic intervention in order to improve the following deficits and impairments:  Abnormal gait, Decreased activity tolerance, Decreased mobility, Decreased endurance, Decreased range of motion, Decreased strength, Hypomobility, Difficulty walking, Increased fascial restricitons, Increased muscle spasms, Impaired flexibility, Pain  Visit Diagnosis: Acute pain of left knee  Stiffness of left knee, not elsewhere classified  Muscle weakness (generalized)  Other abnormalities of gait and mobility     Problem List Patient Active Problem List   Diagnosis Date Noted  . 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

## 2019-03-12 ENCOUNTER — Telehealth: Payer: Self-pay | Admitting: Orthopaedic Surgery

## 2019-03-12 ENCOUNTER — Encounter: Payer: BC Managed Care – PPO | Admitting: Physical Therapy

## 2019-03-12 NOTE — Telephone Encounter (Signed)
Patient called. She would like a new work note. She needs it to say that she can sit as needed or rest every hour. The way Dr. Roda Shutters wrote the note, her employer will not let her return to work. She would like it emailed to her. Her call back number is [phone]

## 2019-03-13 ENCOUNTER — Ambulatory Visit (INDEPENDENT_AMBULATORY_CARE_PROVIDER_SITE_OTHER): Payer: BC Managed Care – PPO | Admitting: Physical Therapy

## 2019-03-13 ENCOUNTER — Other Ambulatory Visit: Payer: Self-pay

## 2019-03-13 ENCOUNTER — Telehealth: Payer: Self-pay | Admitting: Orthopaedic Surgery

## 2019-03-13 DIAGNOSIS — M25662 Stiffness of left knee, not elsewhere classified: Secondary | ICD-10-CM | POA: Diagnosis not present

## 2019-03-13 DIAGNOSIS — M6281 Muscle weakness (generalized): Secondary | ICD-10-CM

## 2019-03-13 DIAGNOSIS — M25562 Pain in left knee: Secondary | ICD-10-CM

## 2019-03-13 NOTE — Telephone Encounter (Signed)
See other msg

## 2019-03-13 NOTE — Telephone Encounter (Signed)
Note made.  

## 2019-03-13 NOTE — Therapy (Signed)
[place], [place], [address] Phone: [phone]   Fax:  [phone]  Physical Therapy Treatment  Patient Details  Name: Jodi Roberts MRN: [id number] Date of Birth: 04-05-1990 Referring Provider (PT): Susy Manor MD   Encounter Date: 03/13/2019  PT End of Session - 03/13/19 1818    Visit Number  3    Number of Visits  15    Date for PT Re-Evaluation  04/21/19    Authorization Type  BCBS    PT Start Time  3710    PT Stop Time  1615    PT Time Calculation (min)  45 min    Activity Tolerance  Patient tolerated treatment well    Behavior During Therapy  [facility] for tasks assessed/performed       Past Medical History:  Diagnosis Date  . Arthritis    osteoarthritis    Past Surgical History:  Procedure Laterality Date  . KNEE ARTHROSCOPY Right    reconstruction surgery  . KNEE CARTILAGE SURGERY Left   . KNEE CLOSED REDUCTION Left 03/07/2019   Procedure: CLOSED MANIPULATION LEFT  KNEE;  Surgeon: Leandrew Koyanagi, MD;  Location: [facility];  Service: Orthopedics;  Laterality: Left;  . LEG SURGERY Right    rods and screws  . TOTAL KNEE ARTHROPLASTY Left 12/09/2018  . TOTAL KNEE ARTHROPLASTY Left 12/09/2018   Procedure: LEFT TOTAL KNEE ARTHROPLASTY;  Surgeon: Leandrew Koyanagi, MD;  Location: [facility];  Service: Orthopedics;  Laterality: Left;  . WISDOM TOOTH EXTRACTION      There were no vitals filed for this visit.  Subjective Assessment - 03/13/19 1806    Subjective  I have been working hard on my exercises and stretches. Knee pain about 3/10 today    Limitations  Lifting;Standing;Walking    How long can you stand comfortably?  hour    How long can you walk comfortably?  hour    Patient Stated Goals  get her ROM back so she can put on her shoes, walk down steps    Pain Onset  More than a month ago         [facility] PT Assessment - 03/13/19 0001      Assessment   Medical Diagnosis  Lt TKA  12/09/18, then required manipulation 03/07/19    Referring Provider (PT)  Susy Manor MD      PROM   Left Knee Extension  3    Left Knee Flexion  102   at end of sesson after max stretching                  [facility] Adult PT Treatment/Exercise - 03/13/19 0001      Knee/Hip Exercises: Stretches   Passive Hamstring Stretch  Left;2 reps;30 seconds    Quad Stretch  Left;3 reps;30 seconds    Quad Stretch Limitations  passive in prone    Gastroc Stretch  3 reps;30 seconds    Gastroc Stretch Limitations  slantboard    Other Knee/Hip Stretches  low load long duration stretching 1 min X 3 with foot on window slidiing down into flexion then holding (pillow case over her foot). Then 1 min X 3 reps with Level 5 band around kette bell in sitting holding foot back into heelslide/knee flexion stretch      Knee/Hip Exercises: Aerobic   Nustep  6 min L5 for knee ROM as close as she can sit and tolerate  Knee/Hip Exercises: Supine   Quad Sets  Left;10 reps    Heel Slides  Left;10 reps    Heel Slides Limitations  10 second hold AAROM with strap      Manual Therapy   Manual therapy comments  extensive stretching for knee flexion and extension with static holds, oscillaitons and mobilizations                   PT Long Term Goals - 03/11/19 1005      PT LONG TERM GOAL #1   Title  Pt will be I and compliant with HEP. (target for all goals 6 weeks 04/21/19)    Status  On-going      PT LONG TERM GOAL #2   Title  Pt will report less than 3/10 overall pain with usual activity and return to work duties    Status  On-going      PT LONG TERM GOAL #3   Title  Pt will improve Lt knee ROM to 0-110 to improve overall function.    Status  New      PT LONG TERM GOAL #4   Title  Pt will be able to ambulate community distances and stairs WFL gait pattern without complaints    Status  New            Plan - 03/13/19 1819    Clinical Impression Statement  Pain  levels are well managed but she continues to struggle with ROM. Session focused more on ROM today and she was able to show improved ROM measurements at end of sesion. Showed her more stretches to do at home including low load long duration stretching. She has good oveall tolerance today to session. PT will continue to stretch and strengthen her knee as much as possible.    Personal Factors and Comorbidities  Time since onset of injury/illness/exacerbation;Past/Current Experience    Examination-Activity Limitations  Bend;Squat;Stairs;Stand;Lift;Locomotion Level    Examination-Participation Restrictions  Cleaning;Community Activity;Shop;Laundry;Other    Stability/Clinical Decision Making  Evolving/Moderate complexity    Rehab Potential  Fair    PT Frequency  Other (comment)    PT Duration  4 weeks    PT Treatment/Interventions  ADLs/Self Care Home Management;Aquatic Therapy;Cryotherapy;Electrical Stimulation;Moist Heat;Ultrasound;Gait training;Stair training;Functional mobility training;Therapeutic activities;Therapeutic exercise;Balance training;Neuromuscular re-education;Manual techniques;Passive range of motion;Dry needling;Joint Manipulations;Taping    PT Next Visit Plan  needs aggressive knee ROM, quad/hip strength and gait/standing activity    PT Home Exercise Plan  Access Code: [email or web address]    Consulted and Agree with Plan of Care  Patient       Patient will benefit from skilled therapeutic intervention in order to improve the following deficits and impairments:  Abnormal gait, Decreased activity tolerance, Decreased mobility, Decreased endurance, Decreased range of motion, Decreased strength, Hypomobility, Difficulty walking, Increased fascial restricitons, Increased muscle spasms, Impaired flexibility, Pain  Visit Diagnosis: Acute pain of left knee  Stiffness of left knee, not elsewhere classified  Muscle weakness (generalized)     Problem List Patient Active Problem List   Diagnosis  Date Noted  . 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 [place], [place], [address] Phone: [phone]   Fax:  [phone]  Name: Jodi Roberts MRN: [id number] Date of Birth: December 30, 1990

## 2019-03-13 NOTE — Telephone Encounter (Signed)
Yes that's fine 

## 2019-03-13 NOTE — Telephone Encounter (Signed)
Patient is present for physical therapy. Patient need letter revised from Dr. Roda Shutters assistant Marisue Ivan. Patient will stop at frontdesk for revised note after PT

## 2019-03-14 ENCOUNTER — Ambulatory Visit (INDEPENDENT_AMBULATORY_CARE_PROVIDER_SITE_OTHER): Payer: BC Managed Care – PPO | Admitting: Physical Therapy

## 2019-03-14 ENCOUNTER — Encounter: Payer: Self-pay | Admitting: Physical Therapy

## 2019-03-14 DIAGNOSIS — M6281 Muscle weakness (generalized): Secondary | ICD-10-CM | POA: Diagnosis not present

## 2019-03-14 DIAGNOSIS — M25662 Stiffness of left knee, not elsewhere classified: Secondary | ICD-10-CM

## 2019-03-14 DIAGNOSIS — R2689 Other abnormalities of gait and mobility: Secondary | ICD-10-CM | POA: Diagnosis not present

## 2019-03-14 DIAGNOSIS — M25562 Pain in left knee: Secondary | ICD-10-CM

## 2019-03-14 NOTE — Therapy (Signed)
[place], [place], [address] Phone: [phone]   Fax:  [phone]  Physical Therapy Treatment  Patient Details  Name: Jodi Roberts MRN: [id number] Date of Birth: May 29, 1990 Referring Provider (PT): Jodi Manor MD   Encounter Date: 03/14/2019  PT End of Session - 03/14/19 1405    Visit Number  5    Number of Visits  15    Date for PT Re-Evaluation  04/21/19    Authorization Type  BCBS    PT Start Time  1401    PT Stop Time  1445    PT Time Calculation (min)  44 min    Activity Tolerance  Patient tolerated treatment well    Behavior During Therapy  [facility] for tasks assessed/performed       Past Medical History:  Diagnosis Date  . Arthritis    osteoarthritis    Past Surgical History:  Procedure Laterality Date  . KNEE ARTHROSCOPY Right    reconstruction surgery  . KNEE CARTILAGE SURGERY Left   . KNEE CLOSED REDUCTION Left 03/07/2019   Procedure: CLOSED MANIPULATION LEFT  KNEE;  Surgeon: Leandrew Koyanagi, MD;  Location: [facility];  Service: Orthopedics;  Laterality: Left;  . LEG SURGERY Right    rods and screws  . TOTAL KNEE ARTHROPLASTY Left 12/09/2018  . TOTAL KNEE ARTHROPLASTY Left 12/09/2018   Procedure: LEFT TOTAL KNEE ARTHROPLASTY;  Surgeon: Leandrew Koyanagi, MD;  Location: [facility];  Service: Orthopedics;  Laterality: Left;  . WISDOM TOOTH EXTRACTION      There were no vitals filed for this visit.  Subjective Assessment - 03/14/19 1404    Subjective  "After yesterday I was sore, but am at only at 2/10"    Patient Stated Goals  get her ROM back so she can put on her shoes, walk down steps    Currently in Pain?  Yes    Pain Score  2     Pain Orientation  Left         [facility] PT Assessment - 03/14/19 0001      Assessment   Medical Diagnosis  Lt TKA 12/09/18, then required manipulation 03/07/19    Referring Provider (PT)  Jodi Manor MD    Next MD Visit  03/21/19     Prior Therapy  virtural PT      AROM   Left Knee Extension  5    Left Knee Flexion  93   100 following manual      PROM   Left Knee Flexion  105   AAROM with strap                  [facility] Adult PT Treatment/Exercise - 03/14/19 0001      Knee/Hip Exercises: Stretches   Active Hamstring Stretch  3 reps;Left   PNF contract/ relax stretch    Quad Stretch  Left;3 reps;30 seconds    Quad Stretch Limitations  supine      Knee/Hip Exercises: Aerobic   Nustep  L7 x 7 min as close as possible and lower seat at 3 min to iincrease ROM      Knee/Hip Exercises: Machines for Strengthening   Other Machine  shuttle 2 x 10 37# LLE only      Knee/Hip Exercises: Seated   Hamstring Curl  Strengthening;2 sets;15 reps   with orange band   Hamstring Limitations  utilized contral lateral knee extension for reciprocal inhibition  of the quad to promote ROM      Knee/Hip Exercises: Supine   Heel Slides  Left;1 set;10 reps      Manual Therapy   Manual therapy comments  DTM along quad    Joint Mobilization  patella mobs, Grade II-III knee mobs for flexion                  PT Long Term Goals - 03/11/19 1005      PT LONG TERM GOAL #1   Title  Pt will be I and compliant with HEP. (target for all goals 6 weeks 04/21/19)    Status  On-going      PT LONG TERM GOAL #2   Title  Pt will report less than 3/10 overall pain with usual activity and return to work duties    Status  On-going      PT LONG TERM GOAL #3   Title  Pt will improve Lt knee ROM to 0-110 to improve overall function.    Status  New      PT LONG TERM GOAL #4   Title  Pt will be able to ambulate community distances and stairs WFL gait pattern without complaints    Status  New            Plan - 03/14/19 1446    Clinical Impression Statement  pt reports decreased pain today despite being sore after the last session. Continued heavy empahsis on knee ROM specifically flexion which initally she measured 93  degrees of active flexion and increased to 105 with AAROM in supine. continued working hamtring strengthening to maintain current ROM and shuttle press to promote bending and quad activation.    PT Next Visit Plan  needs aggressive knee ROM, quad/hip strength and gait/standing activity, how was wall slides at home.    Consulted and Agree with Plan of Care  Patient       Patient will benefit from skilled therapeutic intervention in order to improve the following deficits and impairments:  Abnormal gait, Decreased activity tolerance, Decreased mobility, Decreased endurance, Decreased range of motion, Decreased strength, Hypomobility, Difficulty walking, Increased fascial restricitons, Increased muscle spasms, Impaired flexibility, Pain  Visit Diagnosis: Acute pain of left knee  Stiffness of left knee, not elsewhere classified  Muscle weakness (generalized)  Other abnormalities of gait and mobility     Problem List Patient Active Problem List   Diagnosis Date Noted  . 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 [place], [place], [address] Phone: [phone]   Fax:  [phone]  Name: Jodi Roberts MRN: [id number] Date of Birth: 10/15/1990

## 2019-03-19 ENCOUNTER — Encounter: Payer: BC Managed Care – PPO | Admitting: Physical Therapy

## 2019-03-19 ENCOUNTER — Ambulatory Visit (INDEPENDENT_AMBULATORY_CARE_PROVIDER_SITE_OTHER): Payer: BC Managed Care – PPO | Admitting: Physical Therapy

## 2019-03-19 ENCOUNTER — Other Ambulatory Visit: Payer: Self-pay

## 2019-03-19 DIAGNOSIS — M6281 Muscle weakness (generalized): Secondary | ICD-10-CM

## 2019-03-19 DIAGNOSIS — M25662 Stiffness of left knee, not elsewhere classified: Secondary | ICD-10-CM | POA: Diagnosis not present

## 2019-03-19 DIAGNOSIS — M25562 Pain in left knee: Secondary | ICD-10-CM

## 2019-03-19 DIAGNOSIS — R2689 Other abnormalities of gait and mobility: Secondary | ICD-10-CM

## 2019-03-19 NOTE — Therapy (Signed)
[facility] Physical Therapy [address] [place], [place], [address] Phone: [phone]   Fax:  [phone]  Physical Therapy Treatment  Patient Details  Name: Jodi Roberts MRN: [id number] Date of Birth: 1991-01-26 Referring Provider (PT): Paralee Cancel MD   Encounter Date: 03/19/2019  PT End of Session - 03/19/19 1254    Visit Number  6    Number of Visits  15    Date for PT Re-Evaluation  04/21/19    Authorization Type  BCBS    PT Start Time  1030    PT Stop Time  1115    PT Time Calculation (min)  45 min    Activity Tolerance  Patient tolerated treatment well    Behavior During Therapy  [facility] for tasks assessed/performed       Past Medical History:  Diagnosis Date  . Arthritis    osteoarthritis    Past Surgical History:  Procedure Laterality Date  . KNEE ARTHROSCOPY Right    reconstruction surgery  . KNEE CARTILAGE SURGERY Left   . KNEE CLOSED REDUCTION Left 03/07/2019   Procedure: CLOSED MANIPULATION LEFT  KNEE;  Surgeon: Tarry Kos, MD;  Location: [facility];  Service: Orthopedics;  Laterality: Left;  . LEG SURGERY Right    rods and screws  . TOTAL KNEE ARTHROPLASTY Left 12/09/2018  . TOTAL KNEE ARTHROPLASTY Left 12/09/2018   Procedure: LEFT TOTAL KNEE ARTHROPLASTY;  Surgeon: Tarry Kos, MD;  Location[facility];  Service: Orthopedics;  Laterality: Left;  . WISDOM TOOTH EXTRACTION      There were no vitals filed for this visit.  Subjective Assessment - 03/19/19 1253    Subjective  My Lt knee is 7/8 out of 10 pain after being back to work and standing alot. I am going to ask the MD about work restriction because I dont feel I can handle this. My knee is also swollen and stiff    Limitations  Lifting;Standing;Walking    How long can you stand comfortably?  hour    How long can you walk comfortably?  hour    Patient Stated Goals  get her ROM back so she can put on her shoes, walk down steps    Pain Onset  More than  a month ago         [facility] Adult PT Treatment/Exercise - 03/19/19 0001      Knee/Hip Exercises: Stretches   Other Knee/Hip Stretches  low load long duration stretching 3 min X 2 reps with 5 lb weight  over 2 bolsters.  Seated heel slides and supine heelslides AAROM 10 reps X 10 sec each      Knee/Hip Exercises: Aerobic   Nustep  L7 x 7 min as close as possible and lower seat at 3 min to iincrease ROM      Manual Therapy   Manual therapy comments  Lt knee PROM flexion and mobs for flexion. KT tape for edema with 3 strips weaved         PT Long Term Goals - 03/11/19 1005      PT LONG TERM GOAL #1   Title  Pt will be I and compliant with HEP. (target for all goals 6 weeks 04/21/19)    Status  On-going      PT LONG TERM GOAL #2   Title  Pt will report less than 3/10 overall pain with usual activity and return to work duties    Status  On-going  PT LONG TERM GOAL #3   Title  Pt will improve Lt knee ROM to 0-110 to improve overall function.    Status  New      PT LONG TERM GOAL #4   Title  Pt will be able to ambulate community distances and stairs WFL gait pattern without complaints    Status  New            Plan - 03/19/19 1254    Clinical Impression Statement  She now has good extension ROM but has lost flexion ROM in her Lt knee. Session focused on agressive and extensive stretching for knee flexion. She was trialed on KT tape for edema control. PT will continue to progress ROM as able as this is her biggest deficit. She will Follow up with MD again Friday.    Personal Factors and Comorbidities  Time since onset of injury/illness/exacerbation;Past/Current Experience    Examination-Activity Limitations  Bend;Squat;Stairs;Stand;Lift;Locomotion Level    Examination-Participation Restrictions  Cleaning;Community Activity;Shop;Laundry;Other    Rehab Potential  Fair    PT Frequency  Other (comment)   3-4   PT Duration  4 weeks    PT Treatment/Interventions  ADLs/Self  Care Home Management;Aquatic Therapy;Cryotherapy;Electrical Stimulation;Moist Heat;Ultrasound;Gait training;Stair training;Functional mobility training;Therapeutic activities;Therapeutic exercise;Balance training;Neuromuscular re-education;Manual techniques;Passive range of motion;Dry needling;Joint Manipulations;Taping    PT Next Visit Plan  needs aggressive knee ROM, quad/hip strength and gait/standing activity    PT Home Exercise Plan  Access Code: [email or web address]    Consulted and Agree with Plan of Care  Patient       Patient will benefit from skilled therapeutic intervention in order to improve the following deficits and impairments:  Abnormal gait, Decreased activity tolerance, Decreased mobility, Decreased endurance, Decreased range of motion, Decreased strength, Hypomobility, Difficulty walking, Increased fascial restricitons, Increased muscle spasms, Impaired flexibility, Pain  Visit Diagnosis: Acute pain of left knee  Stiffness of left knee, not elsewhere classified  Muscle weakness (generalized)  Other abnormalities of gait and mobility     Problem List Patient Active Problem List   Diagnosis Date Noted  . Status post total left knee replacement 12/09/2018    Silvestre Mesi 03/19/2019, 12:58 PM  [facility] Physical Therapy [address] [place], [place], [address] Phone: [phone]   Fax:  [phone]  Name: Thamara Leger MRN: [id number] Date of Birth: 02/16/1990

## 2019-03-20 ENCOUNTER — Encounter: Payer: Self-pay | Admitting: Rehabilitative and Restorative Service Providers"

## 2019-03-20 ENCOUNTER — Ambulatory Visit (INDEPENDENT_AMBULATORY_CARE_PROVIDER_SITE_OTHER): Payer: BC Managed Care – PPO | Admitting: Rehabilitative and Restorative Service Providers"

## 2019-03-20 DIAGNOSIS — M6281 Muscle weakness (generalized): Secondary | ICD-10-CM

## 2019-03-20 DIAGNOSIS — R2689 Other abnormalities of gait and mobility: Secondary | ICD-10-CM

## 2019-03-20 DIAGNOSIS — M25662 Stiffness of left knee, not elsewhere classified: Secondary | ICD-10-CM

## 2019-03-20 DIAGNOSIS — M25562 Pain in left knee: Secondary | ICD-10-CM | POA: Diagnosis not present

## 2019-03-20 NOTE — Therapy (Signed)
[place], [place], [address] Phone: [phone]   Fax:  [phone]  Physical Therapy Treatment  Patient Details  Name: Jodi Roberts MRN: [id number] Date of Birth: 1990/03/17 Referring Provider (PT): Susy Manor MD   Encounter Date: 03/20/2019  PT End of Session - 03/20/19 1440    Visit Number  7    Number of Visits  15    Date for PT Re-Evaluation  04/21/19    Authorization Type  BCBS    PT Start Time  1440    PT Stop Time  1540    PT Time Calculation (min)  60 min    Activity Tolerance  Patient tolerated treatment well    Behavior During Therapy  [facility] for tasks assessed/performed       Past Medical History:  Diagnosis Date  . Arthritis    osteoarthritis    Past Surgical History:  Procedure Laterality Date  . KNEE ARTHROSCOPY Right    reconstruction surgery  . KNEE CARTILAGE SURGERY Left   . KNEE CLOSED REDUCTION Left 03/07/2019   Procedure: CLOSED MANIPULATION LEFT  KNEE;  Surgeon: Leandrew Koyanagi, MD;  Location: [place];  Service: Orthopedics;  Laterality: Left;  . LEG SURGERY Right    rods and screws  . TOTAL KNEE ARTHROPLASTY Left 12/09/2018  . TOTAL KNEE ARTHROPLASTY Left 12/09/2018   Procedure: LEFT TOTAL KNEE ARTHROPLASTY;  Surgeon: Leandrew Koyanagi, MD;  Location: [facility];  Service: Orthopedics;  Laterality: Left;  . WISDOM TOOTH EXTRACTION      There were no vitals filed for this visit.  Subjective Assessment - 03/20/19 1442    Subjective  Pt. indicated feeling about 5/10 pain today, "hurting some".   Pt. stated increase in stiffness at work.    Limitations  Lifting;Standing;Walking    How long can you stand comfortably?  hour    How long can you walk comfortably?  hour    Patient Stated Goals  get her ROM back so she can put on her shoes, walk down steps    Pain Score  5     Pain Location  Knee    Pain Orientation  Left    Pain Descriptors / Indicators  Aching    Pain Onset  More than a month ago                       [facility] Adult PT Treatment/Exercise - 03/20/19 1441      Therapeutic Activites    Therapeutic Activities  ADL's    ADL's  sit to stand from 20 inch table height 2 x 10, step up 4 inch 3 x 10 L LE       Knee/Hip Exercises: Aerobic   Nustep  L7 x 10 min, 5 mins L7 as close as possible  to increase ROM      Knee/Hip Exercises: Standing   Forward Step Up  Left;3 sets;10 reps;Step Height: 4"      Knee/Hip Exercises: Seated   Long Arc Quad  AROM;Left;3 sets;10 reps;Weights   3 lbs   Long Arc Quad Weight  3 lbs.      Knee/Hip Exercises: Supine   Terminal Knee Extension  Strengthening;Left;1 set;10 reps   supine slr L   Knee Flexion  AAROM;Left;1 set;10 reps   10 sec hold heel slide     Manual Therapy   Manual therapy comments  MET contract/relax for L knee flexion,  prom, g3-g4 ap mobs to L knee             PT Education - 03/20/19 1524    Education Details  HEP, intervention cues    Person(s) Educated  Patient    Methods  Explanation;Tactile cues;Verbal cues    Comprehension  Verbalized understanding          PT Long Term Goals - 03/11/19 1005      PT LONG TERM GOAL #1   Title  Pt will be I and compliant with HEP. (target for all goals 6 weeks 04/21/19)    Status  On-going      PT LONG TERM GOAL #2   Title  Pt will report less than 3/10 overall pain with usual activity and return to work duties    Status  On-going      PT LONG TERM GOAL #3   Title  Pt will improve Lt knee ROM to 0-110 to improve overall function.    Status  New      PT LONG TERM GOAL #4   Title  Pt will be able to ambulate community distances and stairs WFL gait pattern without complaints    Status  New            Plan - 03/20/19 1514    Clinical Impression Statement  Presentation today indicates continued impairment in knee flexion mobility, both active and passive that impairs daily and work activity.  Restriction  noted from joint and myofascial guarding at this time.  Passive mobility to 85-90 degrees today.  Pt. to benefit from strengthening and movement coordination intervention while addressing mobilty deficit.    Personal Factors and Comorbidities  Time since onset of injury/illness/exacerbation;Past/Current Experience    Examination-Activity Limitations  Bend;Squat;Stairs;Stand;Lift;Locomotion Level    Examination-Participation Restrictions  Cleaning;Community Activity;Shop;Laundry;Other    Rehab Potential  Fair    PT Frequency  Other (comment)   3-4x   PT Duration  4 weeks    PT Treatment/Interventions  ADLs/Self Care Home Management;Aquatic Therapy;Cryotherapy;Electrical Stimulation;Moist Heat;Ultrasound;Gait training;Stair training;Functional mobility training;Therapeutic activities;Therapeutic exercise;Balance training;Neuromuscular re-education;Manual techniques;Passive range of motion;Dry needling;Joint Manipulations;Taping    PT Next Visit Plan  Address mobility, strength deficits, included WB strength/movement coordination c mobility included.    PT Home Exercise Plan  Access Code: [email or web address]    Consulted and Agree with Plan of Care  Patient       Patient will benefit from skilled therapeutic intervention in order to improve the following deficits and impairments:  Abnormal gait, Decreased activity tolerance, Decreased mobility, Decreased endurance, Decreased range of motion, Decreased strength, Hypomobility, Difficulty walking, Increased fascial restricitons, Increased muscle spasms, Impaired flexibility, Pain  Visit Diagnosis: Acute pain of left knee  Stiffness of left knee, not elsewhere classified  Muscle weakness (generalized)  Other abnormalities of gait and mobility     Problem List Patient Active Problem List   Diagnosis Date Noted  . 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

## 2019-03-21 ENCOUNTER — Ambulatory Visit (INDEPENDENT_AMBULATORY_CARE_PROVIDER_SITE_OTHER): Payer: BC Managed Care – PPO | Admitting: Rehabilitative and Restorative Service Providers"

## 2019-03-21 ENCOUNTER — Other Ambulatory Visit: Payer: Self-pay

## 2019-03-21 ENCOUNTER — Ambulatory Visit (INDEPENDENT_AMBULATORY_CARE_PROVIDER_SITE_OTHER): Payer: BC Managed Care – PPO | Admitting: Orthopaedic Surgery

## 2019-03-21 ENCOUNTER — Encounter: Payer: Self-pay | Admitting: Rehabilitative and Restorative Service Providers"

## 2019-03-21 DIAGNOSIS — M25662 Stiffness of left knee, not elsewhere classified: Secondary | ICD-10-CM | POA: Diagnosis not present

## 2019-03-21 DIAGNOSIS — R2689 Other abnormalities of gait and mobility: Secondary | ICD-10-CM

## 2019-03-21 DIAGNOSIS — M6281 Muscle weakness (generalized): Secondary | ICD-10-CM

## 2019-03-21 DIAGNOSIS — Z96652 Presence of left artificial knee joint: Secondary | ICD-10-CM

## 2019-03-21 DIAGNOSIS — M25562 Pain in left knee: Secondary | ICD-10-CM | POA: Diagnosis not present

## 2019-03-21 NOTE — Therapy (Signed)
[facility] [address] [place], [place], [address] Phone: [phone]   Fax:  [phone]  Physical Therapy Treatment  Patient Details  Name: Jodi Roberts MRN: [id number] Date of Birth: [birth year] Referring Provider (PT): Susy Manor MD   Encounter Date: 03/21/2019  PT End of Session - 03/21/19 1409    Visit Number  8    Number of Visits  15    Date for PT Re-Evaluation  04/21/19    Authorization Type  BCBS    PT Start Time  1319    PT Stop Time  1415    PT Time Calculation (min)  56 min    Activity Tolerance  Patient tolerated treatment well    Behavior During Therapy  [facility] for tasks assessed/performed       Past Medical History:  Diagnosis Date  . Arthritis    osteoarthritis    Past Surgical History:  Procedure Laterality Date  . KNEE ARTHROSCOPY Right    reconstruction surgery  . KNEE CARTILAGE SURGERY Left   . KNEE CLOSED REDUCTION Left 03/07/2019   Procedure: CLOSED MANIPULATION LEFT  KNEE;  Surgeon: Leandrew Koyanagi, MD;  Location: [place];  Service: Orthopedics;  Laterality: Left;  . LEG SURGERY Right    rods and screws  . TOTAL KNEE ARTHROPLASTY Left 12/09/2018  . TOTAL KNEE ARTHROPLASTY Left 12/09/2018   Procedure: LEFT TOTAL KNEE ARTHROPLASTY;  Surgeon: Leandrew Koyanagi, MD;  Location: [place];  Service: Orthopedics;  Laterality: Left;  . WISDOM TOOTH EXTRACTION      There were no vitals filed for this visit.  Subjective Assessment - 03/21/19 1330    Subjective  Pt. stated feeling no pain upon arrival today.  Pt. reported seeing MD office earlier.  Reported plan to continue with therapy to address impairments, specifically bending.    Limitations  Lifting;Standing;Walking    How long can you stand comfortably?  hour    How long can you walk comfortably?  hour    Patient Stated Goals  get her ROM back so she can put on her shoes, walk down steps    Currently in Pain?  No/denies    Pain Score   0-No pain    Pain Onset  More than a month ago                       [facility] Adult PT Treatment/Exercise - 03/21/19 1339      Knee/Hip Exercises: Aerobic   Nustep  L7 x 10 min, 5 mins L7 as close as possible  to increase ROM      Knee/Hip Exercises: Machines for Strengthening   Other Machine  Shuttle leg press 3 x 10 75# resistance      Knee/Hip Exercises: Standing   Step Down  Left;3 sets;10 reps   Lateral 4 inch height     Knee/Hip Exercises: Seated   Hamstring Curl  Left;3 sets;10 reps   blue band     Manual Therapy   Manual therapy comments  MET contract/relax for L knee flexion, prom, g3-g4 ap mobs to L knee          Balance Exercises - 03/21/19 1343      Balance Exercises: Standing   SLS  Foam/compliant surface   30 sec x 5   Tandem Gait  Forward;Retro;Foam/compliant surface   10 ft x 6 each way   Sidestepping  Other reps (comment)   Lateral stepping  3 cones x 10 each bilaterally       PT Education - 03/21/19 1335    Education Details  Cues for new intervention techniques    Person(s) Educated  Patient    Methods  Explanation;Demonstration;Verbal cues    Comprehension  Verbalized understanding;Returned demonstration          PT Long Term Goals - 03/11/19 1005      PT LONG TERM GOAL #1   Title  Pt will be I and compliant with HEP. (target for all goals 6 weeks 04/21/19)    Status  On-going      PT LONG TERM GOAL #2   Title  Pt will report less than 3/10 overall pain with usual activity and return to work duties    Status  On-going      PT LONG TERM GOAL #3   Title  Pt will improve Lt knee ROM to 0-110 to improve overall function.    Status  New      PT LONG TERM GOAL #4   Title  Pt will be able to ambulate community distances and stairs WFL gait pattern without complaints    Status  New            Plan - 03/21/19 1346    Clinical Impression Statement  Impairment of knee flexion continues to plan restriction in daily  activity reports.  Fair to good performance noted on SLS activity today on complaint surfaces.    Personal Factors and Comorbidities  Time since onset of injury/illness/exacerbation;Past/Current Experience    Examination-Activity Limitations  Bend;Squat;Stairs;Stand;Lift;Locomotion Level    Examination-Participation Restrictions  Cleaning;Community Activity;Shop;Laundry;Other    Rehab Potential  Fair    PT Frequency  Other (comment)   3-4x   PT Duration  4 weeks    PT Treatment/Interventions  ADLs/Self Care Home Management;Aquatic Therapy;Cryotherapy;Electrical Stimulation;Moist Heat;Ultrasound;Gait training;Stair training;Functional mobility training;Therapeutic activities;Therapeutic exercise;Balance training;Neuromuscular re-education;Manual techniques;Passive range of motion;Dry needling;Joint Manipulations;Taping    PT Next Visit Plan  Knee flexion mobility improvement warranted, continue to incorporate advanced functional and balance activity.    PT Home Exercise Plan  Access Code: [email or web address]    Consulted and Agree with Plan of Care  Patient       Patient will benefit from skilled therapeutic intervention in order to improve the following deficits and impairments:  Abnormal gait, Decreased activity tolerance, Decreased mobility, Decreased endurance, Decreased range of motion, Decreased strength, Hypomobility, Difficulty walking, Increased fascial restricitons, Increased muscle spasms, Impaired flexibility, Pain  Visit Diagnosis: Acute pain of left knee  Stiffness of left knee, not elsewhere classified  Muscle weakness (generalized)  Other abnormalities of gait and mobility     Problem List Patient Active Problem List   Diagnosis Date Noted  . 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

## 2019-03-21 NOTE — Progress Notes (Signed)
Office Visit Note   Patient: Jodi Roberts           Date of Birth: 10-Aug-1990           MRN: [id number] Visit Date: 03/21/2019              Requested by: No referring provider defined for this encounter. PCP: Patient, No Pcp Per   Assessment & Plan: Visit Diagnoses:  1. S/P total knee replacement, left     Plan: Impression is 2 weeks status post left knee manipulation under anesthesia.  Patient will continue with her CPM, home exercise program and outpatient physical therapy.  I encouraged her to really push things for the next several weeks.  She will follow-up with [place] in 4 weeks time for recheck.  Call with concerns or questions in the meantime.  This note is not being shared with the patient for the following reason: To respect privacy (The patient or proxy has requested that the information not be shared).  Follow-Up Instructions: Return in about 4 weeks (around 04/18/2019).   Orders:  No orders of the defined types were placed in this encounter.  No orders of the defined types were placed in this encounter.     Procedures: No procedures performed   Clinical Data: No additional findings.   Subjective: Chief Complaint  Patient presents with  . Left Knee - Routine Post Op    HPI patient is a pleasant 29 year old who comes in today 2 weeks status post left knee manipulation under anesthesia 03/07/2019.  She has been using her CPM as well as doing a home exercise program and attending outpatient physical therapy.  Prior to manipulation, her flexion was at 90 degrees.  After the manipulation Dr.Xu was able to get her to 125 degrees of flexion.  She notes that her first physical therapy visit she was able to get to around 105 degrees of flexion.  After returning to work this past Monday, she is unable to get past 90 degrees.  She thinks this is in part due to stiffness from standing on her feet while at work.  She continues to push things as much as  possible.     Objective: Vital Signs: LMP 02/24/2019 Comment: LMP 02/24/19    Ortho Exam examination of her left knee reveals flexion to about 80 to 85 degrees.  She is neurovascularly intact distally.  Specialty Comments:  No specialty comments available.  Imaging: No new imaging   PMFS History: Patient Active Problem List   Diagnosis Date Noted  . 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 Arthritis    osteoarthritis    Family History  Family history unknown: Yes    Past Surgical History:  Procedure Laterality Date  . KNEE ARTHROSCOPY Right    reconstruction surgery  . KNEE CARTILAGE SURGERY Left   . KNEE CLOSED REDUCTION Left 03/07/2019   Procedure: CLOSED MANIPULATION LEFT  KNEE;  Surgeon: Leandrew Koyanagi, MD;  Location: [facility];  Service: Orthopedics;  Laterality: Left;  . LEG SURGERY Right    rods and screws  . TOTAL KNEE ARTHROPLASTY Left 12/09/2018  . TOTAL KNEE ARTHROPLASTY Left 12/09/2018   Procedure: LEFT TOTAL KNEE ARTHROPLASTY;  Surgeon: Leandrew Koyanagi, MD;  Location: [facility];  Service: Orthopedics;  Laterality: Left;  . WISDOM TOOTH EXTRACTION     Social History   Occupational History  . Not on file  Tobacco  Use  . Smoking status: Never Smoker  . Smokeless tobacco: Never Used  Substance and Sexual Activity  . Alcohol use: Never  . Drug use: Never  . Sexual activity: Not on file

## 2019-03-24 ENCOUNTER — Ambulatory Visit (INDEPENDENT_AMBULATORY_CARE_PROVIDER_SITE_OTHER): Payer: BC Managed Care – PPO | Admitting: Rehabilitative and Restorative Service Providers"

## 2019-03-24 ENCOUNTER — Encounter: Payer: Self-pay | Admitting: Rehabilitative and Restorative Service Providers"

## 2019-03-24 ENCOUNTER — Other Ambulatory Visit: Payer: Self-pay

## 2019-03-24 DIAGNOSIS — M25662 Stiffness of left knee, not elsewhere classified: Secondary | ICD-10-CM | POA: Diagnosis not present

## 2019-03-24 DIAGNOSIS — M6281 Muscle weakness (generalized): Secondary | ICD-10-CM

## 2019-03-24 DIAGNOSIS — R2689 Other abnormalities of gait and mobility: Secondary | ICD-10-CM | POA: Diagnosis not present

## 2019-03-24 DIAGNOSIS — M25562 Pain in left knee: Secondary | ICD-10-CM

## 2019-03-24 NOTE — Therapy (Signed)
[place], [place], [address] Phone: [phone]   Fax:  [phone]  Physical Therapy Treatment  Patient Details  Name: Jodi Roberts MRN: [id number] Date of Birth: 07-Sep-1990 Referring Provider (PT): Susy Manor MD   Encounter Date: 03/24/2019  PT End of Session - 03/24/19 1100    Visit Number  9    Number of Visits  15    Date for PT Re-Evaluation  04/21/19    Authorization Type  BCBS    PT Roberts Time  1003    PT Stop Time  1057    PT Time Calculation (min)  54 min    Activity Tolerance  Patient tolerated treatment well    Behavior During Therapy  [facility] for tasks assessed/performed       Past Medical History:  Diagnosis Date  . Arthritis    osteoarthritis    Past Surgical History:  Procedure Laterality Date  . KNEE ARTHROSCOPY Right    reconstruction surgery  . KNEE CARTILAGE SURGERY Left   . KNEE CLOSED REDUCTION Left 03/07/2019   Procedure: CLOSED MANIPULATION LEFT  KNEE;  Surgeon: Leandrew Koyanagi, MD;  Location: [place];  Service: Orthopedics;  Laterality: Left;  . LEG SURGERY Right    rods and screws  . TOTAL KNEE ARTHROPLASTY Left 12/09/2018  . TOTAL KNEE ARTHROPLASTY Left 12/09/2018   Procedure: LEFT TOTAL KNEE ARTHROPLASTY;  Surgeon: Leandrew Koyanagi, MD;  Location: [facility];  Service: Orthopedics;  Laterality: Left;  . WISDOM TOOTH EXTRACTION      There were no vitals filed for this visit.  Subjective Assessment - 03/24/19 1010    Subjective  Pt. reported no pain at rest today.  Reported 2/10 pain with walking so far at work today.  Comes to clinic in middle of work day.    Limitations  Lifting;Standing;Walking    Patient Stated Goals  get her ROM back so she can put on her shoes, walk down steps    Currently in Pain?  Yes    Pain Score  2     Pain Location  Knee    Pain Orientation  Left    Pain Descriptors / Indicators  Aching    Pain Type  Surgical pain    Pain Onset   More than a month ago                       [facility] Adult PT Treatment/Exercise - 03/24/19 1014      Knee/Hip Exercises: Aerobic   Nustep  L7 x 10 min for ROM      Knee/Hip Exercises: Machines for Strengthening   Other Machine  Shuttle leg press 3 x 15 75# resistance   single leg focus, performed bilateral     Knee/Hip Exercises: Seated   Long Arc Quad  Left;3 sets   3 x 10 paired c hamstring curl   Long Arc Quad Weight  0 lbs.    Hamstring Curl  Left;3 sets;10 reps      Knee/Hip Exercises: Supine   Other Supine Knee/Hip Exercises  Supine LAQ in 90 deg hip flexion hold c 5 second knee flexion stretch    2 x 10 c 5 seconds hold in flexion     Manual Therapy   Manual therapy comments  MET contract/relax for L knee flexion, prom, g3-g4 ap mobs to L knee          Balance Exercises -  03/24/19 1021      Balance Exercises: Standing   SLS  Foam/compliant surface   30 sec x 6 LLE   Retro Gait  Other reps (comment)   10 ft x 6 c focus on toe to heel loading transfer on LLE   Sidestepping  Other reps (comment)        PT Education - 03/24/19 1045    Education Details  Occasional verbal cues for procedures of intervention in clinic.    Person(s) Educated  Patient    Methods  Explanation;Verbal cues    Comprehension  Verbalized understanding;Returned demonstration          PT Long Term Goals - 03/11/19 1005      PT LONG TERM GOAL #1   Title  Pt will be I and compliant with HEP. (target for all goals 6 weeks 04/21/19)    Status  On-going      PT LONG TERM GOAL #2   Title  Pt will report less than 3/10 overall pain with usual activity and return to work duties    Status  On-going      PT LONG TERM GOAL #3   Title  Pt will improve Lt knee ROM to 0-110 to improve overall function.    Status  New      PT LONG TERM GOAL #4   Title  Pt will be able to ambulate community distances and stairs WFL gait pattern without complaints    Status  New             Plan - 03/24/19 1043    Clinical Impression Statement  Knee flexion mobility progressing slowly but steadily in last few visits, both active and passively.  Standing activity at work and in community continues to increase stifness in LLE at this time.    Personal Factors and Comorbidities  Time since onset of injury/illness/exacerbation;Past/Current Experience    Examination-Activity Limitations  Bend;Squat;Stairs;Stand;Lift;Locomotion Level    Examination-Participation Restrictions  Cleaning;Community Activity;Shop;Laundry;Other    Stability/Clinical Decision Making  Evolving/Moderate complexity    PT Next Visit Plan  Continue manual intervention to progress knee flexion mobility, as well as progressing WB activity and strength.    PT Home Exercise Plan  Access Code: [email or web address]    Consulted and Agree with Plan of Care  Patient       Patient will benefit from skilled therapeutic intervention in order to improve the following deficits and impairments:  Abnormal gait, Decreased activity tolerance, Decreased mobility, Decreased endurance, Decreased range of motion, Decreased strength, Hypomobility, Difficulty walking, Increased fascial restricitons, Increased muscle spasms, Impaired flexibility, Pain  Visit Diagnosis: Acute pain of left knee  Stiffness of left knee, not elsewhere classified  Muscle weakness (generalized)  Other abnormalities of gait and mobility     Problem List Patient Active Problem List   Diagnosis Date Noted  . 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

## 2019-03-25 ENCOUNTER — Other Ambulatory Visit: Payer: Self-pay

## 2019-03-25 ENCOUNTER — Ambulatory Visit (INDEPENDENT_AMBULATORY_CARE_PROVIDER_SITE_OTHER): Payer: BC Managed Care – PPO | Admitting: Physical Therapy

## 2019-03-25 ENCOUNTER — Ambulatory Visit: Payer: BC Managed Care – PPO | Admitting: Physical Therapy

## 2019-03-25 ENCOUNTER — Encounter: Payer: Self-pay | Admitting: Physical Therapy

## 2019-03-25 ENCOUNTER — Encounter: Payer: Self-pay | Admitting: Family Medicine

## 2019-03-25 ENCOUNTER — Ambulatory Visit (INDEPENDENT_AMBULATORY_CARE_PROVIDER_SITE_OTHER): Payer: BC Managed Care – PPO | Admitting: Family Medicine

## 2019-03-25 ENCOUNTER — Other Ambulatory Visit (HOSPITAL_COMMUNITY)
Admission: RE | Admit: 2019-03-25 | Discharge: 2019-03-25 | Disposition: A | Discharge status: Home / Self Care | Payer: BC Managed Care – PPO | Source: Ambulatory Visit | Attending: Family Medicine | Admitting: Family Medicine

## 2019-03-25 VITALS — BP 128/85 | HR 92 | Temp 98.2°F | Ht 65.0 in | Wt 215.6 lb

## 2019-03-25 DIAGNOSIS — M25662 Stiffness of left knee, not elsewhere classified: Secondary | ICD-10-CM | POA: Diagnosis not present

## 2019-03-25 DIAGNOSIS — Z113 Encounter for screening for infections with a predominantly sexual mode of transmission: Secondary | ICD-10-CM | POA: Insufficient documentation

## 2019-03-25 DIAGNOSIS — R2689 Other abnormalities of gait and mobility: Secondary | ICD-10-CM | POA: Diagnosis not present

## 2019-03-25 DIAGNOSIS — Z0000 Encounter for general adult medical examination without abnormal findings: Secondary | ICD-10-CM | POA: Diagnosis not present

## 2019-03-25 DIAGNOSIS — M25562 Pain in left knee: Secondary | ICD-10-CM | POA: Diagnosis not present

## 2019-03-25 DIAGNOSIS — M6281 Muscle weakness (generalized): Secondary | ICD-10-CM | POA: Diagnosis not present

## 2019-03-25 DIAGNOSIS — Z23 Encounter for immunization: Secondary | ICD-10-CM

## 2019-03-25 NOTE — Progress Notes (Signed)
2/9/20213:10 PM  Jodi Roberts 1990-05-05, 29 y.o., female [id number]  Chief Complaint  Patient presents with  . Annual Exam    HPI:   Patient is a 28 y.o. female who presents today for CPE  G&Ps: 0 Pap: sept 2020 with gyn STD: requesting today BC : none, declines Menses: regular, light flow Mammogram: none FHX breast/ovarian cancer: none FHx colon cancer: none Exercise/diet: still going to PT s/p TKA Regular diet, eating mostly home cooked   There is no immunization history on file for this patient.  Declines flu vaccine Interested in Td today Reports other IUTD  Has upcoming appt with ENT because of pulsatile tinnitus  Has eye doctor appt later this month Wears eyeglasses  Saw dentist last month  Depression screen [facility] 2/9 03/25/2019  Decreased Interest 0  Down, Depressed, Hopeless 0  PHQ - 2 Score 0    Fall Risk  03/25/2019  Falls in the past year? 0  Number falls in past yr: 0  Injury with Fall? 0     Allergies  Allergen Reactions  . Shellfish Allergy Anaphylaxis and Swelling    Throat closes Only with eating.  Ok to touch iodine or betadine.    Prior to Admission medications   Medication Sig Start Date End Date Taking? Authorizing Provider  amoxicillin (AMOXIL) 500 MG capsule Take 4 pills one hour prior to dental work 03/10/19  Yes Cristie Hem, PA-C  cetirizine (ZYRTEC) 10 MG tablet Take 1 tablet (10 mg total) by mouth daily. Patient taking differently: Take 10 mg by mouth daily as needed (during [facility] ONLY for allergies.).  05/14/18  Yes Eustace Moore, MD  albuterol (PROVENTIL HFA;VENTOLIN HFA) 108 (90 Base) MCG/ACT inhaler Inhale 1-2 puffs into the lungs every 6 (six) hours as needed for wheezing or shortness of breath. Patient not taking: Reported on 03/25/2019 05/14/18   Eustace Moore, MD  HYDROcodone-acetaminophen [facility]) 5-325 MG tablet Take 1-2 tablets by mouth 2 (two) times daily as needed for moderate pain. Patient not taking:  Reported on 03/25/2019 03/07/19   Tarry Kos, MD    Past Medical History:  Diagnosis Date  . Arthritis    osteoarthritis    Past Surgical History:  Procedure Laterality Date  . KNEE ARTHROSCOPY Right    reconstruction surgery  . KNEE CARTILAGE SURGERY Left   . KNEE CLOSED REDUCTION Left 03/07/2019   Procedure: CLOSED MANIPULATION LEFT  KNEE;  Surgeon: Tarry Kos, MD;  Location: [facility];  Service: Orthopedics;  Laterality: Left;  . LEG SURGERY Right    rods and screws  . TOTAL KNEE ARTHROPLASTY Left 12/09/2018  . TOTAL KNEE ARTHROPLASTY Left 12/09/2018   Procedure: LEFT TOTAL KNEE ARTHROPLASTY;  Surgeon: Tarry Kos, MD;  Location: [facility];  Service: Orthopedics;  Laterality: Left;  . WISDOM TOOTH EXTRACTION      Social History   Tobacco Use  . Smoking status: Never Smoker  . Smokeless tobacco: Never Used  Substance Use Topics  . Alcohol use: Never    Family History  Family history unknown: Yes    Review of Systems  Constitutional: Negative for chills, fever and malaise/fatigue.  HENT: Positive for tinnitus. Negative for ear pain and hearing loss.   Respiratory: Negative for cough and shortness of breath.   Cardiovascular: Negative for chest pain, palpitations and leg swelling.  Gastrointestinal: Negative for abdominal pain, blood in stool, constipation, diarrhea, heartburn, melena, nausea and vomiting.  Genitourinary: Negative for dysuria and  hematuria.       Neg breast lumps or nipple discharge Neg vaginal discharge, pelvic pain, dyspareunia, abnormal vaginal bleeding  Musculoskeletal: Positive for joint pain.  Neurological: Negative for dizziness and headaches.  Endo/Heme/Allergies: Positive for environmental allergies.  Psychiatric/Behavioral: Negative for depression. The patient is not nervous/anxious and does not have insomnia.   All other systems reviewed and are negative.    OBJECTIVE:  Today's Vitals   03/25/19 1443  BP: 128/85    Pulse: 92  Temp: 98.2 F (36.8 C)  SpO2: 97%  Weight: 215 lb 9.6 oz (97.8 kg)  Height: 5\' 5"  (1.651 m)   Body mass index is 35.88 kg/m.   Hearing Screening   125Hz  250Hz  500Hz  1000Hz  2000Hz  3000Hz  4000Hz  6000Hz  8000Hz   Right ear:           Left ear:           Vision Screening Comments: Pt did not bring glasses. Vision test could not be completed   Physical Exam Vitals and nursing note reviewed.  Constitutional:      Appearance: She is well-developed.  HENT:     Head: Normocephalic and atraumatic.     Right Ear: Hearing, tympanic membrane, ear canal and external ear normal.     Left Ear: Hearing, tympanic membrane, ear canal and external ear normal.     Mouth/Throat:     Mouth: Mucous membranes are moist.     Pharynx: No oropharyngeal exudate or posterior oropharyngeal erythema.  Eyes:     Extraocular Movements: Extraocular movements intact.     Conjunctiva/sclera: Conjunctivae normal.     Pupils: Pupils are equal, round, and reactive to light.  Neck:     Thyroid: No thyromegaly.  Cardiovascular:     Rate and Rhythm: Normal rate and regular rhythm.     Heart sounds: Normal heart sounds. No murmur. No friction rub. No gallop.   Pulmonary:     Effort: Pulmonary effort is normal.     Breath sounds: Normal breath sounds. No wheezing, rhonchi or rales.  Abdominal:     General: Bowel sounds are normal. There is no distension.     Palpations: Abdomen is soft. There is no hepatomegaly, splenomegaly or mass.     Tenderness: There is no abdominal tenderness.  Musculoskeletal:        General: Swelling (Left knee, decreased ROM) present.     Cervical back: Neck supple.     Right lower leg: No edema.     Left lower leg: No edema.  Lymphadenopathy:     Cervical: No cervical adenopathy.  Skin:    General: Skin is warm and dry.  Neurological:     Mental Status: She is alert and oriented to person, place, and time.     Cranial Nerves: No cranial nerve deficit.     Gait: Gait  abnormal (favoring left leg).     Deep Tendon Reflexes: Reflexes are normal and symmetric.  Psychiatric:        Mood and Affect: Mood normal.        Behavior: Behavior normal.     No results found for this or any previous visit (from the past 24 hour(s)).  No results found.   ASSESSMENT and PLAN  1. Annual physical exam No concerns per history or exam. Routine HCM labs ordered. HCM reviewed/discussed. Anticipatory guidance regarding healthy weight, lifestyle and choices given.   2. Screen for STD (sexually transmitted disease) - RPR - HIV Antibody (routine testing w rflx) -  Urine cytology ancillary only  3. Need for prophylactic vaccination with tetanus toxoid alone  Other orders - Td vaccine greater than or equal to 7yo preservative free IM  Return in about 1 year (around 03/24/2020).    Rutherford Guys, MD [facility] at [facility], [place] [address] Ph.  [phone] Fax [phone]

## 2019-03-25 NOTE — Therapy (Signed)
[place], [place], [address] Phone: [phone]   Fax:  [phone]  Physical Therapy Treatment  Patient Details  Name: Jodi Roberts MRN: [id number] Date of Birth: 1990/03/19 Referring Provider (PT): Susy Manor MD   Encounter Date: 03/25/2019  PT End of Session - 03/25/19 1318    Visit Number  10    Number of Visits  15    Date for PT Re-Evaluation  04/21/19    Authorization Type  BCBS    PT Start Time  4010    PT Stop Time  1230    PT Time Calculation (min)  45 min    Activity Tolerance  Patient tolerated treatment well    Behavior During Therapy  [facility] for tasks assessed/performed       Past Medical History:  Diagnosis Date  . Arthritis    osteoarthritis    Past Surgical History:  Procedure Laterality Date  . KNEE ARTHROSCOPY Right    reconstruction surgery  . KNEE CARTILAGE SURGERY Left   . KNEE CLOSED REDUCTION Left 03/07/2019   Procedure: CLOSED MANIPULATION LEFT  KNEE;  Surgeon: Leandrew Koyanagi, MD;  Location: [facility];  Service: Orthopedics;  Laterality: Left;  . LEG SURGERY Right    rods and screws  . TOTAL KNEE ARTHROPLASTY Left 12/09/2018  . TOTAL KNEE ARTHROPLASTY Left 12/09/2018   Procedure: LEFT TOTAL KNEE ARTHROPLASTY;  Surgeon: Leandrew Koyanagi, MD;  Location: [facility];  Service: Orthopedics;  Laterality: Left;  . WISDOM TOOTH EXTRACTION      There were no vitals filed for this visit.  Subjective Assessment - 03/25/19 1256    Subjective  Pt reporting no pain at rest. Pt reporting more stiffness due to incresaed swelling from work.    Limitations  Lifting;Standing;Walking    How long can you stand comfortably?  hour    Patient Stated Goals  get her ROM back so she can put on her shoes, walk down steps    Currently in Pain?  Yes    Pain Score  3    across incision site with movements   Pain Location  Knee    Pain Orientation  Left    Pain Descriptors / Indicators   Aching    Pain Type  Surgical pain    Pain Onset  More than a month ago         [facility] PT Assessment - 03/25/19 0001      AROM   Left Knee Extension  5    Left Knee Flexion  80      PROM   Left Knee Flexion  95                   [facility] Adult PT Treatment/Exercise - 03/25/19 0001      Exercises   Exercises  Knee/Hip      Knee/Hip Exercises: Aerobic   Recumbent Bike  Rocking back and forth until pt was able to reach full revolution x 6 minutes      Knee/Hip Exercises: Standing   Lateral Step Up  Left;20 reps;Hand Hold: 1    Forward Step Up  Left;20 reps;Hand Hold: 1    Step Down  Left;15 reps;Hand Hold: 2;Step Height: 6"      Knee/Hip Exercises: Seated   Long Arc Quad  Left;2 sets;10 reps   3 x 10 paired c hamstring curl     Knee/Hip Exercises: Supine   Straight Leg  Raises  15 reps    Straight Leg Raises Limitations  pt instructed to perform quad set prior to lifting.       Knee/Hip Exercises: Prone   Hamstring Curl  15 reps;5 seconds;Limitations    Hamstring Curl Limitations  using strap      Manual Therapy   Manual therapy comments  12 minutes    Joint Mobilization  seated knee flexed, distraction mobs performed with PROM for flexion, grade 3-4  mobs, contract/relax performed                   PT Long Term Goals - 03/25/19 1321      PT LONG TERM GOAL #1   Title  Pt will be I and compliant with HEP. (target for all goals 6 weeks 04/21/19)    Status  On-going      PT LONG TERM GOAL #2   Title  Pt will report less than 3/10 overall pain with usual activity and return to work duties    Status  On-going      PT LONG TERM GOAL #3   Title  Pt will improve Lt knee ROM to 0-110 to improve overall function.    Status  On-going      PT LONG TERM GOAL #4   Title  Pt will be able to ambulate community distances and stairs WFL gait pattern without complaints    Status  On-going            Plan - 03/25/19 1319    Clinical Impression  Statement  Pt slowly progressing toward increased ROM with lost of ROM after starting back at work. Pt with incresaed edema noted. Pt could possibly benefit from low grade compression stockings for L LE. Continue skilled PT progressing toward more functional gait, strengthening, and range.    Examination-Activity Limitations  Bend;Squat;Stairs;Stand;Lift;Locomotion Level    Examination-Participation Restrictions  Cleaning;Community Activity;Shop;Laundry;Other    Stability/Clinical Decision Making  Evolving/Moderate complexity    Rehab Potential  Fair    PT Duration  4 weeks    PT Treatment/Interventions  ADLs/Self Care Home Management;Aquatic Therapy;Cryotherapy;Electrical Stimulation;Moist Heat;Ultrasound;Gait training;Stair training;Functional mobility training;Therapeutic activities;Therapeutic exercise;Balance training;Neuromuscular re-education;Manual techniques;Passive range of motion;Dry needling;Joint Manipulations;Taping    PT Next Visit Plan  Continue manual intervention to progress knee flexion mobility, as well as progressing WB activity and strength.    PT Home Exercise Plan  Access Code: [email or web address]    Consulted and Agree with Plan of Care  Patient       Patient will benefit from skilled therapeutic intervention in order to improve the following deficits and impairments:  Abnormal gait, Decreased activity tolerance, Decreased mobility, Decreased endurance, Decreased range of motion, Decreased strength, Hypomobility, Difficulty walking, Increased fascial restricitons, Increased muscle spasms, Impaired flexibility, Pain  Visit Diagnosis: Acute pain of left knee  Stiffness of left knee, not elsewhere classified  Muscle weakness (generalized)  Other abnormalities of gait and mobility     Problem List Patient Active Problem List   Diagnosis Date Noted  . 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  Physical Therapy [address] [place], [place], [address] Phone: [phone]   Fax:  [phone]  Name: Jodi Roberts MRN: [id number] Date of Birth: 1990-08-30

## 2019-03-25 NOTE — Patient Instructions (Addendum)
If you have lab work done today you will be contacted with your lab results within the next 2 weeks.  If you have not heard from [place] then please contact us. The fastest way to get your results is to register for My Chart.   IF you received an x-ray today, you will receive an invoice from [facility]. Please contact [facility] at [phone] with questions or concerns regarding your invoice.   IF you received labwork today, you will receive an invoice from [facility]. Please contact [facility] at [phone] with questions or concerns regarding your invoice.   Our billing staff will not be able to assist you with questions regarding bills from these companies.  You will be contacted with the lab results as soon as they are available. The fastest way to get your results is to activate your My Chart account. Instructions are located on the last page of this paperwork. If you have not heard from [place] regarding the results in 2 weeks, please contact this office.     Preventive Care 32-74 Years Old, Female Preventive care refers to visits with your health care provider and lifestyle choices that can promote health and wellness. This includes:  A yearly physical exam. This may also be called an annual well check.  Regular dental visits and eye exams.  Immunizations.  Screening for certain conditions.  Healthy lifestyle choices, such as eating a healthy diet, getting regular exercise, not using drugs or products that contain nicotine and tobacco, and limiting alcohol use. What can I expect for my preventive care visit? Physical exam Your health care provider will check your:  Height and weight. This may be used to calculate body mass index (BMI), which tells if you are at a healthy weight.  Heart rate and blood pressure.  Skin for abnormal spots. Counseling Your health care provider may ask you questions about your:  Alcohol, tobacco, and drug use.  Emotional  well-being.  Home and relationship well-being.  Sexual activity.  Eating habits.  Work and work Statistician.  Method of birth control.  Menstrual cycle.  Pregnancy history. What immunizations do I need?  Influenza (flu) vaccine  This is recommended every year. Tetanus, diphtheria, and pertussis (Tdap) vaccine  You may need a Td booster every 10 years. Varicella (chickenpox) vaccine  You may need this if you have not been vaccinated. Human papillomavirus (HPV) vaccine  If recommended by your health care provider, you may need three doses over 6 months. Measles, mumps, and rubella (MMR) vaccine  You may need at least one dose of MMR. You may also need a second dose. Meningococcal conjugate (MenACWY) vaccine  One dose is recommended if you are age 57-21 years and a first-year college student living in a residence hall, or if you have one of several medical conditions. You may also need additional booster doses. Pneumococcal conjugate (PCV13) vaccine  You may need this if you have certain conditions and were not previously vaccinated. Pneumococcal polysaccharide (PPSV23) vaccine  You may need one or two doses if you smoke cigarettes or if you have certain conditions. Hepatitis A vaccine  You may need this if you have certain conditions or if you travel or work in places where you may be exposed to hepatitis A. Hepatitis B vaccine  You may need this if you have certain conditions or if you travel or work in places where you may be exposed to hepatitis B. Haemophilus influenzae type b (Hib) vaccine  You  may need this if you have certain conditions. You may receive vaccines as individual doses or as more than one vaccine together in one shot (combination vaccines). Talk with your health care provider about the risks and benefits of combination vaccines. What tests do I need?  Blood tests  Lipid and cholesterol levels. These may be checked every 5 years starting at age  59.  Hepatitis C test.  Hepatitis B test. Screening  Diabetes screening. This is done by checking your blood sugar (glucose) after you have not eaten for a while (fasting).  Sexually transmitted disease (STD) testing.  BRCA-related cancer screening. This may be done if you have a family history of breast, ovarian, tubal, or peritoneal cancers.  Pelvic exam and Pap test. This may be done every 3 years starting at age 65. Starting at age 39, this may be done every 5 years if you have a Pap test in combination with an HPV test. Talk with your health care provider about your test results, treatment options, and if necessary, the need for more tests. Follow these instructions at home: Eating and drinking   Eat a diet that includes fresh fruits and vegetables, whole grains, lean protein, and low-fat dairy.  Take vitamin and mineral supplements as recommended by your health care provider.  Do not drink alcohol if: ? Your health care provider tells you not to drink. ? You are pregnant, may be pregnant, or are planning to become pregnant.  If you drink alcohol: ? Limit how much you have to 0-1 drink a day. ? Be aware of how much alcohol is in your drink. In the [place] one drink equals one 12 oz bottle of beer (355 mL), one 5 oz glass of wine (148 mL), or one 1 oz glass of hard liquor (44 mL). Lifestyle  Take daily care of your teeth and gums.  Stay active. Exercise for at least 30 minutes on 5 or more days each week.  Do not use any products that contain nicotine or tobacco, such as cigarettes, e-cigarettes, and chewing tobacco. If you need help quitting, ask your health care provider.  If you are sexually active, practice safe sex. Use a condom or other form of birth control (contraception) in order to prevent pregnancy and STIs (sexually transmitted infections). If you plan to become pregnant, see your health care provider for a preconception visit. What's next?  Visit your health  care provider once a year for a well check visit.  Ask your health care provider how often you should have your eyes and teeth checked.  Stay up to date on all vaccines. This information is not intended to replace advice given to you by your health care provider. Make sure you discuss any questions you have with your health care provider. Document Revised: 10/11/2017 Document Reviewed: 10/11/2017 Elsevier Patient Education  2020 [employer].

## 2019-03-26 LAB — HIV ANTIBODY (ROUTINE TESTING W REFLEX): HIV Screen 4th Generation wRfx: NONREACTIVE

## 2019-03-26 LAB — RPR: RPR Ser Ql: NONREACTIVE

## 2019-03-27 LAB — URINE CYTOLOGY ANCILLARY ONLY
Chlamydia: NEGATIVE
Comment: NEGATIVE
Comment: NEGATIVE
Comment: NORMAL
Neisseria Gonorrhea: NEGATIVE
Trichomonas: NEGATIVE

## 2019-03-28 ENCOUNTER — Ambulatory Visit (INDEPENDENT_AMBULATORY_CARE_PROVIDER_SITE_OTHER): Payer: BC Managed Care – PPO | Admitting: Physical Therapy

## 2019-03-28 ENCOUNTER — Other Ambulatory Visit: Payer: Self-pay

## 2019-03-28 DIAGNOSIS — M25662 Stiffness of left knee, not elsewhere classified: Secondary | ICD-10-CM | POA: Diagnosis not present

## 2019-03-28 DIAGNOSIS — R2689 Other abnormalities of gait and mobility: Secondary | ICD-10-CM

## 2019-03-28 DIAGNOSIS — M25562 Pain in left knee: Secondary | ICD-10-CM

## 2019-03-28 DIAGNOSIS — M6281 Muscle weakness (generalized): Secondary | ICD-10-CM

## 2019-03-28 NOTE — Therapy (Signed)
[facility] [address] [place], [place], [address] Phone: [phone]   Fax:  [phone]  Physical Therapy Treatment  Patient Details  Name: Jodi Roberts MRN: [id number] Date of Birth: Dec 05, 1990 Referring Provider (PT): Paralee Cancel MD   Encounter Date: 03/28/2019  PT End of Session - 03/28/19 1115    Visit Number  11    Number of Visits  15    Date for PT Re-Evaluation  04/21/19    Authorization Type  BCBS    PT Start Time  1100    PT Stop Time  1145    PT Time Calculation (min)  45 min    Activity Tolerance  Patient tolerated treatment well    Behavior During Therapy  [facility] for tasks assessed/performed       Past Medical History:  Diagnosis Date  . Arthritis    osteoarthritis    Past Surgical History:  Procedure Laterality Date  . KNEE ARTHROSCOPY Right    reconstruction surgery  . KNEE CARTILAGE SURGERY Left   . KNEE CLOSED REDUCTION Left 03/07/2019   Procedure: CLOSED MANIPULATION LEFT  KNEE;  Surgeon: Tarry Kos, MD;  Location: [facility];  Service: Orthopedics;  Laterality: Left;  . LEG SURGERY Right    rods and screws  . TOTAL KNEE ARTHROPLASTY Left 12/09/2018  . TOTAL KNEE ARTHROPLASTY Left 12/09/2018   Procedure: LEFT TOTAL KNEE ARTHROPLASTY;  Surgeon: Tarry Kos, MD;  [facility];  Service: Orthopedics;  Laterality: Left;  . WISDOM TOOTH EXTRACTION      There were no vitals filed for this visit.  Subjective Assessment - 03/28/19 1114    Subjective  Pt arriving to therpay reporting no pain today and reporting she has only worked for one hour. Pt feels like her swelling is much better.    Patient Stated Goals  get her ROM back so she can put on her shoes, walk down steps    Currently in Pain?  No/denies                       [facility] Adult PT Treatment/Exercise - 03/28/19 0001      Exercises   Exercises  Knee/Hip      Knee/Hip Exercises: Aerobic   Recumbent  Bike  Rocking back and forth until pt was able to reach full revolution x 6 minutes      Knee/Hip Exercises: Machines for Strengthening   Other Machine  shuttle Leg Press 75# 15 reps x 3 sets      Knee/Hip Exercises: Standing   Lateral Step Up  Left;20 reps;Hand Hold: 1    Step Down  Left;15 reps;Hand Hold: 2;Step Height: 6"    Wall Squat  5 reps;10 seconds    Wall Squat Limitations  ball between knees      Knee/Hip Exercises: Supine   Bridges  Strengthening;15 reps    Bridges Limitations  calves on therapy ball     Straight Leg Raises  15 reps    Straight Leg Raises Limitations  pt instructed to perform quad set prior to lifting.       Knee/Hip Exercises: Prone   Hamstring Curl  15 reps;5 seconds;Limitations    Hamstring Curl Limitations  using strap      Manual Therapy   Manual therapy comments  10 minutes    Joint Mobilization  seated knee flexed, distraction mobs performed with PROM for flexion, grade 3-4  mobs, contract/relax  performed           Balance Exercises - 03/28/19 1127      Balance Exercises: Standing   SLS  Foam/compliant surface   30 sec x 6 LLE            PT Long Term Goals - 03/28/19 1120      PT LONG TERM GOAL #1   Title  Pt will be I and compliant with HEP. (target for all goals 6 weeks 04/21/19)    Status  On-going      PT LONG TERM GOAL #2   Title  Pt will report less than 3/10 overall pain with usual activity and return to work duties    Status  On-going      PT LONG TERM GOAL #3   Title  Pt will improve Lt knee ROM to 0-110 to improve overall function.    Status  On-going      PT LONG TERM GOAL #4   Title  Pt will be able to ambulate community distances and stairs WFL gait pattern without complaints    Status  On-going            Plan - 03/28/19 1116    Clinical Impression Statement  Pt arriving to therapy today reporting no pain. We discussed purchasing over the counter compression stockings (thigh high)  for pt to wear  while working.  Pt tolreating exercises well. Still limited by medial knee pain today. Pt making progress with ROM and functional mobility. Continue skilled PT.    Personal Factors and Comorbidities  Time since onset of injury/illness/exacerbation;Past/Current Experience    Examination-Activity Limitations  Bend;Squat;Stairs;Stand;Lift;Locomotion Level    Examination-Participation Restrictions  Cleaning;Community Activity;Shop;Laundry;Other    Stability/Clinical Decision Making  Evolving/Moderate complexity    Rehab Potential  Fair    PT Frequency  Other (comment)    PT Duration  4 weeks    PT Treatment/Interventions  ADLs/Self Care Home Management;Aquatic Therapy;Cryotherapy;Electrical Stimulation;Moist Heat;Ultrasound;Gait training;Stair training;Functional mobility training;Therapeutic activities;Therapeutic exercise;Balance training;Neuromuscular re-education;Manual techniques;Passive range of motion;Dry needling;Joint Manipulations;Taping    PT Next Visit Plan  Continue manual intervention to progress knee flexion mobility, as well as progressing WB activity and strength.    PT Home Exercise Plan  Access Code: [email or web address]    Consulted and Agree with Plan of Care  Patient       Patient will benefit from skilled therapeutic intervention in order to improve the following deficits and impairments:     Visit Diagnosis: Stiffness of left knee, not elsewhere classified  Acute pain of left knee  Muscle weakness (generalized)  Other abnormalities of gait and mobility     Problem List Patient Active Problem List   Diagnosis Date Noted  . Status post total left knee replacement 12/09/2018    Oretha Caprice, PT 03/28/2019, 11:32 AM  [facility] Physical Therapy [address] [place], [place], [address] Phone: [phone]   Fax:  [phone]  Name: Denessa Cavan MRN: [id number] Date of Birth: 05-Jun-1990

## 2019-04-01 ENCOUNTER — Ambulatory Visit (INDEPENDENT_AMBULATORY_CARE_PROVIDER_SITE_OTHER): Payer: BC Managed Care – PPO | Admitting: Rehabilitative and Restorative Service Providers"

## 2019-04-01 ENCOUNTER — Encounter: Payer: BC Managed Care – PPO | Admitting: Physical Therapy

## 2019-04-01 ENCOUNTER — Encounter: Payer: Self-pay | Admitting: Rehabilitative and Restorative Service Providers"

## 2019-04-01 ENCOUNTER — Other Ambulatory Visit: Payer: Self-pay

## 2019-04-01 DIAGNOSIS — R600 Localized edema: Secondary | ICD-10-CM

## 2019-04-01 DIAGNOSIS — M6281 Muscle weakness (generalized): Secondary | ICD-10-CM | POA: Diagnosis not present

## 2019-04-01 DIAGNOSIS — M25562 Pain in left knee: Secondary | ICD-10-CM | POA: Diagnosis not present

## 2019-04-01 DIAGNOSIS — R2689 Other abnormalities of gait and mobility: Secondary | ICD-10-CM | POA: Diagnosis not present

## 2019-04-01 DIAGNOSIS — M25662 Stiffness of left knee, not elsewhere classified: Secondary | ICD-10-CM

## 2019-04-01 NOTE — Therapy (Signed)
[place], [place], [address] Phone: [phone]   Fax:  [phone]  Physical Therapy Treatment  Patient Details  Name: Jodi Roberts MRN: [id number] Date of Birth: 07-12-1990 Referring Provider (PT): Susy Manor MD   Encounter Date: 04/01/2019  PT End of Session - 04/01/19 1356    Visit Number  12    Number of Visits  15    Date for PT Re-Evaluation  04/21/19    Authorization Type  BCBS    PT Start Time  1308    PT Stop Time  1400    PT Time Calculation (min)  52 min    Activity Tolerance  Patient tolerated treatment well    Behavior During Therapy  [facility] for tasks assessed/performed       Past Medical History:  Diagnosis Date  . Arthritis    osteoarthritis    Past Surgical History:  Procedure Laterality Date  . KNEE ARTHROSCOPY Right    reconstruction surgery  . KNEE CARTILAGE SURGERY Left   . KNEE CLOSED REDUCTION Left 03/07/2019   Procedure: CLOSED MANIPULATION LEFT  KNEE;  Surgeon: Leandrew Koyanagi, MD;  Location: [place];  Service: Orthopedics;  Laterality: Left;  . LEG SURGERY Right    rods and screws  . TOTAL KNEE ARTHROPLASTY Left 12/09/2018  . TOTAL KNEE ARTHROPLASTY Left 12/09/2018   Procedure: LEFT TOTAL KNEE ARTHROPLASTY;  Surgeon: Leandrew Koyanagi, MD;  Location: [place];  Service: Orthopedics;  Laterality: Left;  . WISDOM TOOTH EXTRACTION      There were no vitals filed for this visit.  Subjective Assessment - 04/01/19 1313    Subjective  Pt. stated no pain with normal activity today.  Pt. indicated no work today either.  Just tight in bending today.    Limitations  Lifting;Standing;Walking    How long can you stand comfortably?  hour    How long can you walk comfortably?  hour    Patient Stated Goals  get her ROM back so she can put on her shoes, walk down steps    Currently in Pain?  No/denies    Pain Score  0-No pain    Pain Location  Knee    Pain Onset  More  than a month ago                       [facility] Adult PT Treatment/Exercise - 04/01/19 0001      Neuro Re-ed    Neuro Re-ed Details   SLS on airex 30 sec x 5 BLE, tandem ambulation fwd/rev 10 ft x 5 each on foam      Knee/Hip Exercises: Stretches   Other Knee/Hip Stretches  supine knee flexion low load long duration stretch at wall 5 # 5 mins      Knee/Hip Exercises: Aerobic   Recumbent Bike  3/4 revolution to tightness 5 mins      Knee/Hip Exercises: Machines for Strengthening   Other Machine  shuttle Leg Press 85# 15 reps x 3 sets      Knee/Hip Exercises: Standing   Forward Step Up  Both;2 sets;15 reps   6 inch step c TKE band   Functional Squat  3 sets;10 reps   12 lbs c seat touch down 23 inch height     Knee/Hip Exercises: Seated   Hamstring Curl  3 sets;10 reps   blue band     Modalities   Modalities  Vasopneumatic      Vasopneumatic   Number Minutes Vasopneumatic   10 minutes    Vasopnuematic Location   Knee    Vasopneumatic Pressure  Medium    Vasopneumatic Temperature   34      Manual Therapy   Joint Mobilization  g3-g4 ap mobs proximal tibia, MET techniques for flexion             PT Education - 04/01/19 1314    Education Details  Cues for intervention    Person(s) Educated  Patient    Methods  Explanation;Verbal cues    Comprehension  Verbalized understanding;Returned demonstration          PT Long Term Goals - 03/28/19 1120      PT LONG TERM GOAL #1   Title  Pt will be I and compliant with HEP. (target for all goals 6 weeks 04/21/19)    Status  On-going      PT LONG TERM GOAL #2   Title  Pt will report less than 3/10 overall pain with usual activity and return to work duties    Status  On-going      PT LONG TERM GOAL #3   Title  Pt will improve Lt knee ROM to 0-110 to improve overall function.    Status  On-going      PT LONG TERM GOAL #4   Title  Pt will be able to ambulate community distances and stairs WFL gait pattern  without complaints    Status  On-going            Plan - 04/01/19 1354    Clinical Impression Statement  Pt. demonstrated mild improvement in flexion today to 95 degrees passive c reduced pain complaints.  Continued improvement on none work days at this time.    Personal Factors and Comorbidities  Time since onset of injury/illness/exacerbation;Past/Current Experience    Examination-Activity Limitations  Bend;Squat;Stairs;Stand;Lift;Locomotion Level    Examination-Participation Restrictions  Cleaning;Community Activity;Shop;Laundry;Other    Stability/Clinical Decision Making  Evolving/Moderate complexity    Rehab Potential  Fair    PT Frequency  Other (comment)    PT Duration  4 weeks    PT Treatment/Interventions  ADLs/Self Care Home Management;Aquatic Therapy;Cryotherapy;Electrical Stimulation;Moist Heat;Ultrasound;Gait training;Stair training;Functional mobility training;Therapeutic activities;Therapeutic exercise;Balance training;Neuromuscular re-education;Manual techniques;Passive range of motion;Dry needling;Joint Manipulations;Taping    PT Next Visit Plan  Continue flexion based mobility training, encourage low load long duration stretching as tolerated.    PT Home Exercise Plan  Access Code: [email or web address]    Consulted and Agree with Plan of Care  Patient       Patient will benefit from skilled therapeutic intervention in order to improve the following deficits and impairments:     Visit Diagnosis: Stiffness of left knee, not elsewhere classified  Acute pain of left knee  Muscle weakness (generalized)  Other abnormalities of gait and mobility  Localized edema     Problem List Patient Active Problem List   Diagnosis Date Noted  . Status post total left knee replacement 12/09/2018    Scot Jun, PT, DPT, OCS, ATC 04/01/19  1:57 PM     [facility] [address] [place], [place], [address] Phone: [phone]   Fax:   [phone]  Name: Jodi Roberts MRN: [id number] Date of Birth: [birth year]

## 2019-04-02 ENCOUNTER — Encounter: Payer: Self-pay | Admitting: Rehabilitative and Restorative Service Providers"

## 2019-04-02 ENCOUNTER — Ambulatory Visit (INDEPENDENT_AMBULATORY_CARE_PROVIDER_SITE_OTHER): Payer: BC Managed Care – PPO | Admitting: Rehabilitative and Restorative Service Providers"

## 2019-04-02 DIAGNOSIS — R2689 Other abnormalities of gait and mobility: Secondary | ICD-10-CM | POA: Diagnosis not present

## 2019-04-02 DIAGNOSIS — R600 Localized edema: Secondary | ICD-10-CM

## 2019-04-02 DIAGNOSIS — M6281 Muscle weakness (generalized): Secondary | ICD-10-CM | POA: Diagnosis not present

## 2019-04-02 DIAGNOSIS — M25662 Stiffness of left knee, not elsewhere classified: Secondary | ICD-10-CM | POA: Diagnosis not present

## 2019-04-02 DIAGNOSIS — M25562 Pain in left knee: Secondary | ICD-10-CM | POA: Diagnosis not present

## 2019-04-02 NOTE — Therapy (Signed)
[facility] [place], [place], [address] Phone: [phone]   Fax:  [phone]  Physical Therapy Treatment  Patient Details  Name: Jodi Roberts MRN: [id number] Date of Birth: 02/22/1990 Referring Provider (PT): Susy Manor MD   Encounter Date: 04/02/2019  PT End of Session - 04/02/19 1224    Visit Number  13    Number of Visits  15    Date for PT Re-Evaluation  04/21/19    Authorization Type  BCBS    PT Start Time  0762    PT Stop Time  1230    PT Time Calculation (min)  45 min    Activity Tolerance  Patient tolerated treatment well    Behavior During Therapy  [facility] for tasks assessed/performed       Past Medical History:  Diagnosis Date  . Arthritis    osteoarthritis    Past Surgical History:  Procedure Laterality Date  . KNEE ARTHROSCOPY Right    reconstruction surgery  . KNEE CARTILAGE SURGERY Left   . KNEE CLOSED REDUCTION Left 03/07/2019   Procedure: CLOSED MANIPULATION LEFT  KNEE;  Surgeon: Leandrew Koyanagi, MD;  Location: [place];  Service: Orthopedics;  Laterality: Left;  . LEG SURGERY Right    rods and screws  . TOTAL KNEE ARTHROPLASTY Left 12/09/2018  . TOTAL KNEE ARTHROPLASTY Left 12/09/2018   Procedure: LEFT TOTAL KNEE ARTHROPLASTY;  Surgeon: Leandrew Koyanagi, MD;  Location: [place];  Service: Orthopedics;  Laterality: Left;  . WISDOM TOOTH EXTRACTION      There were no vitals filed for this visit.  Subjective Assessment - 04/02/19 1157    Subjective  Tightness after work this morning.  No pain at rest.    Limitations  Lifting;Standing;Walking    How long can you stand comfortably?  hour    How long can you walk comfortably?  hour    Patient Stated Goals  get her ROM back so she can put on her shoes, walk down steps    Currently in Pain?  No/denies    Pain Score  0-No pain    Pain Location  Knee    Pain Orientation  Left    Pain Onset  More than a month ago                        [facility] Adult PT Treatment/Exercise - 04/02/19 0001      Neuro Re-ed    Neuro Re-ed Details   Star excursion 5 cones c slider x 10 each LE,       Knee/Hip Exercises: Stretches   Other Knee/Hip Stretches  supine knee flexion low load long duration stretch at wall 5 # 5 mins      Knee/Hip Exercises: Aerobic   Recumbent Bike  3/4 revolution to tightness 5 mins      Knee/Hip Exercises: Machines for Strengthening   Other Machine  shuttle Leg Press 87# 15 reps x 3 sets      Knee/Hip Exercises: Standing   Forward Step Up  Both;2 sets;15 reps      Knee/Hip Exercises: Seated   Hamstring Curl  3 sets;10 reps      Manual Therapy   Joint Mobilization  g3-g4 ap mobs proximal tibia, MET techniques for flexion             PT Education - 04/02/19 1157    Education Details  Cues for intervention techniques as  needed.    Person(s) Educated  Patient    Methods  Explanation;Verbal cues    Comprehension  Verbalized understanding;Returned demonstration          PT Long Term Goals - 03/28/19 1120      PT LONG TERM GOAL #1   Title  Pt will be I and compliant with HEP. (target for all goals 6 weeks 04/21/19)    Status  On-going      PT LONG TERM GOAL #2   Title  Pt will report less than 3/10 overall pain with usual activity and return to work duties    Status  On-going      PT LONG TERM GOAL #3   Title  Pt will improve Lt knee ROM to 0-110 to improve overall function.    Status  On-going      PT LONG TERM GOAL #4   Title  Pt will be able to ambulate community distances and stairs WFL gait pattern without complaints    Status  On-going            Plan - 04/02/19 1223    Clinical Impression Statement  Flexion continues to be main impairment at this time c limitations in daily activity requiring flexion past 90 (sitting prolonged, transfers, stairs reciprocally).    Personal Factors and Comorbidities  Time since onset of  injury/illness/exacerbation;Past/Current Experience    Examination-Activity Limitations  Bend;Squat;Stairs;Stand;Lift;Locomotion Level    Examination-Participation Restrictions  Cleaning;Community Activity;Shop;Laundry;Other    Stability/Clinical Decision Making  Evolving/Moderate complexity    Rehab Potential  Fair    PT Frequency  Other (comment)    PT Duration  4 weeks    PT Treatment/Interventions  ADLs/Self Care Home Management;Aquatic Therapy;Cryotherapy;Electrical Stimulation;Moist Heat;Ultrasound;Gait training;Stair training;Functional mobility training;Therapeutic activities;Therapeutic exercise;Balance training;Neuromuscular re-education;Manual techniques;Passive range of motion;Dry needling;Joint Manipulations;Taping    PT Next Visit Plan  Flexion focus in manual intervention, progress balance activity/strength    PT Home Exercise Plan  Access Code: [email or web address]    Consulted and Agree with Plan of Care  Patient       Patient will benefit from skilled therapeutic intervention in order to improve the following deficits and impairments:     Visit Diagnosis: Stiffness of left knee, not elsewhere classified  Acute pain of left knee  Muscle weakness (generalized)  Other abnormalities of gait and mobility  Localized edema     Problem List Patient Active Problem List   Diagnosis Date Noted  . Status post total left knee replacement 12/09/2018    Scot Jun, PT, DPT, OCS, ATC 04/02/19  12:25 PM     [facility] [address] [place], [place], [address] Phone: [phone]   Fax:  [phone]  Name: Jodi Roberts MRN: [id number] Date of Birth: October 17, 1990

## 2019-04-04 ENCOUNTER — Other Ambulatory Visit: Payer: Self-pay

## 2019-04-04 ENCOUNTER — Encounter: Payer: Self-pay | Admitting: Rehabilitative and Restorative Service Providers"

## 2019-04-04 ENCOUNTER — Ambulatory Visit (INDEPENDENT_AMBULATORY_CARE_PROVIDER_SITE_OTHER): Payer: BC Managed Care – PPO | Admitting: Rehabilitative and Restorative Service Providers"

## 2019-04-04 DIAGNOSIS — R600 Localized edema: Secondary | ICD-10-CM

## 2019-04-04 DIAGNOSIS — M6281 Muscle weakness (generalized): Secondary | ICD-10-CM

## 2019-04-04 DIAGNOSIS — M25562 Pain in left knee: Secondary | ICD-10-CM

## 2019-04-04 DIAGNOSIS — R2689 Other abnormalities of gait and mobility: Secondary | ICD-10-CM

## 2019-04-04 DIAGNOSIS — M25662 Stiffness of left knee, not elsewhere classified: Secondary | ICD-10-CM

## 2019-04-04 NOTE — Therapy (Signed)
[facility] [place], [place], [address] Phone: [phone]   Fax:  [phone]  Physical Therapy Treatment  Patient Details  Name: Jodi Roberts MRN: [id number] Date of Birth: [birth year] Referring Provider (PT): Susy Manor MD   Encounter Date: 04/04/2019  PT End of Session - 04/04/19 1221    Visit Number  14    Number of Visits  15    Date for PT Re-Evaluation  04/21/19    Authorization Type  BCBS    PT Start Time  1142    PT Stop Time  1240    PT Time Calculation (min)  58 min    Activity Tolerance  Patient tolerated treatment well    Behavior During Therapy  [facility] for tasks assessed/performed       Past Medical History:  Diagnosis Date  . Arthritis    osteoarthritis    Past Surgical History:  Procedure Laterality Date  . KNEE ARTHROSCOPY Right    reconstruction surgery  . KNEE CARTILAGE SURGERY Left   . KNEE CLOSED REDUCTION Left 03/07/2019   Procedure: CLOSED MANIPULATION LEFT  KNEE;  Surgeon: Leandrew Koyanagi, MD;  Location: [place];  Service: Orthopedics;  Laterality: Left;  . LEG SURGERY Right    rods and screws  . TOTAL KNEE ARTHROPLASTY Left 12/09/2018  . TOTAL KNEE ARTHROPLASTY Left 12/09/2018   Procedure: LEFT TOTAL KNEE ARTHROPLASTY;  Surgeon: Leandrew Koyanagi, MD;  Location: [place];  Service: Orthopedics;  Laterality: Left;  . WISDOM TOOTH EXTRACTION      There were no vitals filed for this visit.  Subjective Assessment - 04/04/19 1147    Subjective  Pt. stated feeling alright today.  Some stifness and mild pain at times.    Limitations  Lifting;Standing;Walking    How long can you stand comfortably?  hour    How long can you walk comfortably?  hour    Patient Stated Goals  get her ROM back so she can put on her shoes, walk down steps    Currently in Pain?  No/denies    Pain Score  0-No pain    Pain Location  Knee    Pain Orientation  Left    Pain Descriptors / Indicators   Aching    Pain Type  Surgical pain    Pain Onset  More than a month ago                       [facility] Adult PT Treatment/Exercise - 04/04/19 0001      Neuro Re-ed    Neuro Re-ed Details   Star excursion 5 cones c slider x 10 each LE,       Knee/Hip Exercises: Stretches   Other Knee/Hip Stretches  seated low load long duration stretch c resistance band in sitting for Lt knee flexion 5 mins      Knee/Hip Exercises: Aerobic   Nustep  L6 10 mins      Knee/Hip Exercises: Standing   Lateral Step Up  Left;3 sets;10 reps   4 inch step c eccentric focus in lowering     Knee/Hip Exercises: Seated   Hamstring Curl  3 sets;10 reps   blue band     Vasopneumatic   Number Minutes Vasopneumatic   10 minutes    Vasopnuematic Location   Knee    Vasopneumatic Pressure  Medium    Vasopneumatic Temperature   34  Manual Therapy   Manual therapy comments  IMT Dry needling, active compression to distal/medial quad     Joint Mobilization  g3-g4 ap mobs proximal tibia, MET techniques for flexion             PT Education - 04/04/19 1148    Education Details  Intervention cues for techniques.  Dry needling, trigger point release education.  Consent given verbally.    Person(s) Educated  Patient    Methods  Explanation;Verbal cues    Comprehension  Verbalized understanding;Returned demonstration          PT Long Term Goals - 03/28/19 1120      PT LONG TERM GOAL #1   Title  Pt will be I and compliant with HEP. (target for all goals 6 weeks 04/21/19)    Status  On-going      PT LONG TERM GOAL #2   Title  Pt will report less than 3/10 overall pain with usual activity and return to work duties    Status  On-going      PT LONG TERM GOAL #3   Title  Pt will improve Lt knee ROM to 0-110 to improve overall function.    Status  On-going      PT LONG TERM GOAL #4   Title  Pt will be able to ambulate community distances and stairs WFL gait pattern without complaints     Status  On-going            Plan - 04/04/19 1218    Clinical Impression Statement  Fair tolerance to manual trigger point release techniques.  Indications of improving quality of available range at this time.  Quad control continue to improve in available range.    Personal Factors and Comorbidities  Time since onset of injury/illness/exacerbation;Past/Current Experience    Examination-Activity Limitations  Bend;Squat;Stairs;Stand;Lift;Locomotion Level    Examination-Participation Restrictions  Cleaning;Community Activity;Shop;Laundry;Other    Stability/Clinical Decision Making  Evolving/Moderate complexity    Rehab Potential  Fair    PT Frequency  Other (comment)    PT Duration  4 weeks    PT Treatment/Interventions  ADLs/Self Care Home Management;Aquatic Therapy;Cryotherapy;Electrical Stimulation;Moist Heat;Ultrasound;Gait training;Stair training;Functional mobility training;Therapeutic activities;Therapeutic exercise;Balance training;Neuromuscular re-education;Manual techniques;Passive range of motion;Dry needling;Joint Manipulations;Taping    PT Next Visit Plan  Progress note next visit.    PT Home Exercise Plan  Access Code: [email or web address]    Consulted and Agree with Plan of Care  Patient       Patient will benefit from skilled therapeutic intervention in order to improve the following deficits and impairments:     Visit Diagnosis: Stiffness of left knee, not elsewhere classified  Localized edema  Acute pain of left knee  Muscle weakness (generalized)  Other abnormalities of gait and mobility     Problem List Patient Active Problem List   Diagnosis Date Noted  . Status post total left knee replacement 12/09/2018    Scot Jun, PT, DPT, OCS, ATC 04/04/19  12:36 PM      [facility] [address] [place], [place], [address] Phone: [phone]   Fax:  [phone]  Name: Jodi Roberts MRN: [id number] Date of Birth:  1990-11-18

## 2019-04-08 ENCOUNTER — Other Ambulatory Visit: Payer: Self-pay

## 2019-04-08 ENCOUNTER — Encounter: Payer: Self-pay | Admitting: Physical Therapy

## 2019-04-08 ENCOUNTER — Ambulatory Visit (INDEPENDENT_AMBULATORY_CARE_PROVIDER_SITE_OTHER): Payer: BC Managed Care – PPO | Admitting: Physical Therapy

## 2019-04-08 DIAGNOSIS — R2689 Other abnormalities of gait and mobility: Secondary | ICD-10-CM

## 2019-04-08 DIAGNOSIS — M25562 Pain in left knee: Secondary | ICD-10-CM | POA: Diagnosis not present

## 2019-04-08 DIAGNOSIS — M25662 Stiffness of left knee, not elsewhere classified: Secondary | ICD-10-CM

## 2019-04-08 DIAGNOSIS — M6281 Muscle weakness (generalized): Secondary | ICD-10-CM | POA: Diagnosis not present

## 2019-04-08 DIAGNOSIS — R600 Localized edema: Secondary | ICD-10-CM | POA: Diagnosis not present

## 2019-04-08 NOTE — Therapy (Signed)
[place], [place], [address] Phone: [phone]   Fax:  [phone]  Physical Therapy Treatment/Recert  Patient Details  Name: Jodi Roberts MRN: [id number] Date of Birth: [birth year] Referring Provider (PT): Susy Manor MD   Encounter Date: 04/08/2019  PT End of Session - 04/08/19 1252    Visit Number  15    Number of Visits  28    Date for PT Re-Evaluation  05/06/19    Authorization Type  BCBS    PT Start Time  4259    PT Stop Time  1228    PT Time Calculation (min)  43 min    Activity Tolerance  Patient tolerated treatment well    Behavior During Therapy  [facility] for tasks assessed/performed       Past Medical History:  Diagnosis Date  . Arthritis    osteoarthritis    Past Surgical History:  Procedure Laterality Date  . KNEE ARTHROSCOPY Right    reconstruction surgery  . KNEE CARTILAGE SURGERY Left   . KNEE CLOSED REDUCTION Left 03/07/2019   Procedure: CLOSED MANIPULATION LEFT  KNEE;  Surgeon: Leandrew Koyanagi, MD;  Location: [place];  Service: Orthopedics;  Laterality: Left;  . LEG SURGERY Right    rods and screws  . TOTAL KNEE ARTHROPLASTY Left 12/09/2018  . TOTAL KNEE ARTHROPLASTY Left 12/09/2018   Procedure: LEFT TOTAL KNEE ARTHROPLASTY;  Surgeon: Leandrew Koyanagi, MD;  Location: [place];  Service: Orthopedics;  Laterality: Left;  . WISDOM TOOTH EXTRACTION      There were no vitals filed for this visit.  Subjective Assessment - 04/08/19 1147    Subjective  doing well; knee feels pretty good today. pain still up to 6-7/10 during the day    Limitations  Lifting;Standing;Walking    How long can you stand comfortably?  hour    How long can you walk comfortably?  hour    Patient Stated Goals  get her ROM back so she can put on her shoes, walk down steps    Currently in Pain?  No/denies    Pain Onset  More than a month ago         [facility] PT Assessment - 04/08/19 1253       Assessment   Medical Diagnosis  L TKA 12/09/2018    Referring Provider (PT)  Susy Manor MD      PROM   Left Knee Extension  0    Left Knee Flexion  93                   [facility] Adult PT Treatment/Exercise - 04/08/19 1147      Knee/Hip Exercises: Stretches   Quad Stretch  Left;5 reps;20 seconds    Quad Stretch Limitations  prone with strap      Knee/Hip Exercises: Aerobic   Nustep  L6 10 mins      Knee/Hip Exercises: Supine   Other Supine Knee/Hip Exercises  hamstring curl on red physioball; left with 5 sec hold with strap x 20; then x 10 with PT overpressure      Manual Therapy   Manual therapy comments  seated knee flexion with contract/relax, gentle distraction; prone knee flexion                  PT Long Term Goals - 04/08/19 1252      PT LONG TERM GOAL #1   Title  Pt  will be I and compliant with HEP.    Status  On-going    Target Date  05/06/19      PT LONG TERM GOAL #2   Title  Pt will report less than 3/10 overall pain with usual activity and return to work duties    Baseline  2/23: up to 7/10 at end of shift    Status  On-going    Target Date  05/06/19      PT LONG TERM GOAL #3   Title  Pt will improve Lt knee ROM to 0-110 to improve overall function.    Baseline  2/23: 0-93    Status  On-going    Target Date  05/06/19      PT LONG TERM GOAL #4   Title  Pt will be able to ambulate community distances and stairs WFL gait pattern without complaints    Status  On-going    Target Date  05/06/19            Plan - 04/08/19 1254    Clinical Impression Statement  Pt continues to demonstrate decreased ROM with firm end feels.  Recommend continued PT 2-3x/wk x 4 more weeks to maxmize function and decrease pain.  May benefit from home device to help with low load long duration stretch for knee flexion.  All goals ongoing at this time.    Personal Factors and Comorbidities  Time since onset of  injury/illness/exacerbation;Past/Current Experience    Examination-Activity Limitations  Bend;Squat;Stairs;Stand;Lift;Locomotion Level    Examination-Participation Restrictions  Cleaning;Community Activity;Shop;Laundry;Other    Stability/Clinical Decision Making  Evolving/Moderate complexity    Rehab Potential  Fair    PT Frequency  Other (comment)   2-3x/wk   PT Duration  4 weeks    PT Treatment/Interventions  ADLs/Self Care Home Management;Aquatic Therapy;Cryotherapy;Electrical Stimulation;Moist Heat;Ultrasound;Gait training;Stair training;Functional mobility training;Therapeutic activities;Therapeutic exercise;Balance training;Neuromuscular re-education;Manual techniques;Passive range of motion;Dry needling;Joint Manipulations;Taping    PT Next Visit Plan  continue with ROM, functional activiites    PT Home Exercise Plan  Access Code: [email or web address]    Consulted and Agree with Plan of Care  Patient       Patient will benefit from skilled therapeutic intervention in order to improve the following deficits and impairments:  Abnormal gait, Decreased activity tolerance, Decreased mobility, Decreased endurance, Decreased range of motion, Decreased strength, Hypomobility, Difficulty walking, Increased fascial restricitons, Increased muscle spasms, Impaired flexibility, Pain  Visit Diagnosis: Stiffness of left knee, not elsewhere classified - Plan: PT plan of care cert/re-cert  Localized edema - Plan: PT plan of care cert/re-cert  Acute pain of left knee - Plan: PT plan of care cert/re-cert  Muscle weakness (generalized) - Plan: PT plan of care cert/re-cert  Other abnormalities of gait and mobility - Plan: PT plan of care cert/re-cert     Problem List Patient Active Problem List   Diagnosis Date Noted  . 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

## 2019-04-09 ENCOUNTER — Encounter: Payer: BC Managed Care – PPO | Admitting: Rehabilitative and Restorative Service Providers"

## 2019-04-11 ENCOUNTER — Encounter: Payer: BC Managed Care – PPO | Admitting: Rehabilitative and Restorative Service Providers"

## 2019-04-11 ENCOUNTER — Telehealth: Payer: Self-pay | Admitting: Rehabilitative and Restorative Service Providers"

## 2019-04-11 NOTE — Telephone Encounter (Signed)
Pt. Did not show for appointment at 1:15pm today.  Called and left voice message with number for return call.  Pt. Does have appointment scheduled for next week already. Chyrel Masson, PT, DPT, OCS, ATC 04/11/19  1:47 PM

## 2019-04-15 ENCOUNTER — Other Ambulatory Visit: Payer: Self-pay

## 2019-04-15 ENCOUNTER — Encounter: Payer: Self-pay | Admitting: Rehabilitative and Restorative Service Providers"

## 2019-04-15 ENCOUNTER — Ambulatory Visit (INDEPENDENT_AMBULATORY_CARE_PROVIDER_SITE_OTHER): Payer: BC Managed Care – PPO | Admitting: Rehabilitative and Restorative Service Providers"

## 2019-04-15 DIAGNOSIS — M6281 Muscle weakness (generalized): Secondary | ICD-10-CM

## 2019-04-15 DIAGNOSIS — M25562 Pain in left knee: Secondary | ICD-10-CM

## 2019-04-15 DIAGNOSIS — R600 Localized edema: Secondary | ICD-10-CM

## 2019-04-15 DIAGNOSIS — R2689 Other abnormalities of gait and mobility: Secondary | ICD-10-CM | POA: Diagnosis not present

## 2019-04-15 DIAGNOSIS — M25662 Stiffness of left knee, not elsewhere classified: Secondary | ICD-10-CM

## 2019-04-15 NOTE — Therapy (Signed)
[facility] [address] [place], [place], [address] Phone: [phone]   Fax:  [phone]  Physical Therapy Treatment  Patient Details  Name: Jodi Roberts MRN: [id number] Date of Birth: 16-Dec-1990 Referring Provider (PT): Paralee Cancel MD   Encounter Date: 04/15/2019  PT End of Session - 04/15/19 1128    Visit Number  16    Number of Visits  28    Date for PT Re-Evaluation  05/06/19    Authorization Type  BCBS    PT Start Time  1100    PT Stop Time  1142    PT Time Calculation (min)  42 min    Activity Tolerance  Patient tolerated treatment well    Behavior During Therapy  [facility] for tasks assessed/performed       Past Medical History:  Diagnosis Date  . Arthritis    osteoarthritis    Past Surgical History:  Procedure Laterality Date  . KNEE ARTHROSCOPY Right    reconstruction surgery  . KNEE CARTILAGE SURGERY Left   . KNEE CLOSED REDUCTION Left 03/07/2019   Procedure: CLOSED MANIPULATION LEFT  KNEE;  Surgeon: Tarry Kos, MD;  Location: [facility];  Service: Orthopedics;  Laterality: Left;  . LEG SURGERY Right    rods and screws  . TOTAL KNEE ARTHROPLASTY Left 12/09/2018  . TOTAL KNEE ARTHROPLASTY Left 12/09/2018   Procedure: LEFT TOTAL KNEE ARTHROPLASTY;  Surgeon: Tarry Kos, MD;  [facility];  Service: Orthopedics;  Laterality: Left;  . WISDOM TOOTH EXTRACTION      There were no vitals filed for this visit.  Subjective Assessment - 04/15/19 1106    Subjective  Pt. indicated feeling pretty good today, moving around ok.  Bending is still tight but sometimes looser than others.    Limitations  Lifting;Standing;Walking    How long can you stand comfortably?  hour    How long can you walk comfortably?  hour    Patient Stated Goals  get her ROM back so she can put on her shoes, walk down steps    Currently in Pain?  No/denies    Pain Score  0-No pain    Pain Location  Knee    Pain Orientation   Left    Pain Onset  More than a month ago                       [facility] Adult PT Treatment/Exercise - 04/15/19 0001      Neuro Re-ed    Neuro Re-ed Details   lateral stepping c SL loading 3 cones x 10 bilateral, SLS on airex foam 30 sec x 5 bilateral, single leg RDL to cone x 15 bilateral      Knee/Hip Exercises: Aerobic   Nustep  L6 12 mins      Knee/Hip Exercises: Plyometrics   Other Plyometric Exercises  87 lbs on plyo leg press DL 3 x 15      Knee/Hip Exercises: Standing   Lateral Step Up  3 sets;10 reps;Both   6 inch step     Manual Therapy   Manual therapy comments  seated knee flexion with contract/relax, gentle distraction; prone knee flexion             PT Education - 04/15/19 1106    Education Details  Cues of intervention at times.  POC education regarding flexion gains possible.    Person(s) Educated  Patient  Methods  Verbal cues    Comprehension  Verbalized understanding;Returned demonstration          PT Long Term Goals - 04/08/19 1252      PT LONG TERM GOAL #1   Title  Pt will be I and compliant with HEP.    Status  On-going    Target Date  05/06/19      PT LONG TERM GOAL #2   Title  Pt will report less than 3/10 overall pain with usual activity and return to work duties    Baseline  2/23: up to 7/10 at end of shift    Status  On-going    Target Date  05/06/19      PT LONG TERM GOAL #3   Title  Pt will improve Lt knee ROM to 0-110 to improve overall function.    Baseline  2/23: 0-93    Status  On-going    Target Date  05/06/19      PT LONG TERM GOAL #4   Title  Pt will be able to ambulate community distances and stairs WFL gait pattern without complaints    Status  On-going    Target Date  05/06/19            Plan - 04/15/19 1119    Clinical Impression Statement  Pt. showing improvement in quality of movement in available range with improving strength/balance control.   End range still limited compared to normal  functional goals.    Personal Factors and Comorbidities  Time since onset of injury/illness/exacerbation;Past/Current Experience    Examination-Activity Limitations  Bend;Squat;Stairs;Stand;Lift;Locomotion Level    Examination-Participation Restrictions  Cleaning;Community Activity;Shop;Laundry;Other    Stability/Clinical Decision Making  Evolving/Moderate complexity    Rehab Potential  Fair    PT Frequency  Other (comment)   2-3x/wk   PT Duration  4 weeks    PT Treatment/Interventions  ADLs/Self Care Home Management;Aquatic Therapy;Cryotherapy;Electrical Stimulation;Moist Heat;Ultrasound;Gait training;Stair training;Functional mobility training;Therapeutic activities;Therapeutic exercise;Balance training;Neuromuscular re-education;Manual techniques;Passive range of motion;Dry needling;Joint Manipulations;Taping    PT Next Visit Plan  MD visit this week, PN next visit recommended.    PT Home Exercise Plan  Access Code: [email or web address]    Consulted and Agree with Plan of Care  Patient       Patient will benefit from skilled therapeutic intervention in order to improve the following deficits and impairments:  Abnormal gait, Decreased activity tolerance, Decreased mobility, Decreased endurance, Decreased range of motion, Decreased strength, Hypomobility, Difficulty walking, Increased fascial restricitons, Increased muscle spasms, Impaired flexibility, Pain  Visit Diagnosis: Stiffness of left knee, not elsewhere classified  Other abnormalities of gait and mobility  Localized edema  Acute pain of left knee  Muscle weakness (generalized)     Problem List Patient Active Problem List   Diagnosis Date Noted  . Status post total left knee replacement 12/09/2018    Scot Jun, PT, DPT, OCS, ATC 04/15/19  11:39 AM    [facility] Physical Therapy [address] [place], [place], [address] Phone: [phone]   Fax:  [phone]  Name: Nealy Hickmon MRN:  [id number] Date of Birth: April 02, 1990

## 2019-04-17 ENCOUNTER — Encounter: Payer: BC Managed Care – PPO | Admitting: Rehabilitative and Restorative Service Providers"

## 2019-04-17 ENCOUNTER — Telehealth: Payer: Self-pay | Admitting: Rehabilitative and Restorative Service Providers"

## 2019-04-17 NOTE — Telephone Encounter (Signed)
Pt. Indicated she was stuck at work and couldn't make it.  She reported that she called the clinic to leave message.   Chyrel Masson, PT, DPT, OCS, ATC 04/17/19  12:18 PM

## 2019-04-18 ENCOUNTER — Encounter: Payer: Self-pay | Admitting: Physician Assistant

## 2019-04-18 ENCOUNTER — Ambulatory Visit (INDEPENDENT_AMBULATORY_CARE_PROVIDER_SITE_OTHER): Payer: BC Managed Care – PPO | Admitting: Physician Assistant

## 2019-04-18 ENCOUNTER — Ambulatory Visit (INDEPENDENT_AMBULATORY_CARE_PROVIDER_SITE_OTHER): Payer: BC Managed Care – PPO | Admitting: Physical Therapy

## 2019-04-18 ENCOUNTER — Other Ambulatory Visit: Payer: Self-pay

## 2019-04-18 DIAGNOSIS — M25662 Stiffness of left knee, not elsewhere classified: Secondary | ICD-10-CM

## 2019-04-18 DIAGNOSIS — Z96652 Presence of left artificial knee joint: Secondary | ICD-10-CM

## 2019-04-18 DIAGNOSIS — R600 Localized edema: Secondary | ICD-10-CM

## 2019-04-18 DIAGNOSIS — R2689 Other abnormalities of gait and mobility: Secondary | ICD-10-CM | POA: Diagnosis not present

## 2019-04-18 DIAGNOSIS — M6281 Muscle weakness (generalized): Secondary | ICD-10-CM

## 2019-04-18 DIAGNOSIS — M25562 Pain in left knee: Secondary | ICD-10-CM

## 2019-04-18 NOTE — Progress Notes (Signed)
   Post-Op Visit Note   Patient: Jodi Roberts           Date of Birth: 10-09-1990           MRN: [id number] Visit Date: 04/18/2019 PCP: Patient, No Pcp Per   Assessment & Plan:  Chief Complaint:  Chief Complaint  Patient presents with  . Left Knee - Routine Post Op   Visit Diagnoses:  1. S/P TKR (total knee replacement), left     Plan: Patient is a pleasant 29 year old female who comes in today a little over 4 months out left total knee replacement and approximately 6 weeks out left knee manipulation under anesthesia.  Prior to manipulation her flexion was at 90 degrees.  After the manipulation we were able to get her flexion to 125 degrees.  Her first  visit after the manipulation she was at 105 degrees.  When we saw her about 4 weeks ago, she was at around 90 degrees.  She has been going to physical therapy for approximately 3 days a week and cannot get past 95 degrees.  Examination of her left knee shows range of motion from 0 to about 85 degrees.  She is stable to valgus varus stress.  She is neurovascular intact distally.  At this point, she really needs to continue to push things at physical therapy as well as at home.  She notes that the physical therapists are working on getting her approved for some sort of knee flexion device.  Hopefully this will help.  She will follow-up with [place] in 8 weeks time for repeat evaluation and AP and lateral x-rays of the left knee.  Call with concerns or questions in meantime.  Follow-Up Instructions: Return in about 8 weeks (around 06/13/2019).   Orders:  No orders of the defined types were placed in this encounter.  No orders of the defined types were placed in this encounter.   Imaging: No new imaging  PMFS History: Patient Active Problem List   Diagnosis Date Noted  . 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 Arthritis    osteoarthritis    Family History  Family history unknown:  Yes    Past Surgical History:  Procedure Laterality Date  . KNEE ARTHROSCOPY Right    reconstruction surgery  . KNEE CARTILAGE SURGERY Left   . KNEE CLOSED REDUCTION Left 03/07/2019   Procedure: CLOSED MANIPULATION LEFT  KNEE;  Surgeon: Tarry Kos, MD;  [facility];  Service: Orthopedics;  Laterality: Left;  . LEG SURGERY Right    rods and screws  . TOTAL KNEE ARTHROPLASTY Left 12/09/2018  . TOTAL KNEE ARTHROPLASTY Left 12/09/2018   Procedure: LEFT TOTAL KNEE ARTHROPLASTY;  Surgeon: Tarry Kos, MD;  Location[facility];  Service: Orthopedics;  Laterality: Left;  . WISDOM TOOTH EXTRACTION     Social History   Occupational History  . Not on file  Tobacco Use  . Smoking status: Never Smoker  . Smokeless tobacco: Never Used  Substance and Sexual Activity  . Alcohol use: Never  . Drug use: Never  . Sexual activity: Yes    Birth control/protection: Condom

## 2019-04-18 NOTE — Therapy (Signed)
[facility] [address] [place], [place], [address] Phone: [phone]   Fax:  [phone]  Physical Therapy Treatment  Patient Details  Name: Jodi Roberts MRN: [id number] Date of Birth: Jun 19, 1990 Referring Provider (PT): Paralee Cancel MD   Encounter Date: 04/18/2019  PT End of Session - 04/18/19 1506    Visit Number  17    Number of Visits  28    Date for PT Re-Evaluation  05/06/19    Authorization Type  BCBS    PT Start Time  1445    PT Stop Time  1510    PT Time Calculation (min)  25 min    Activity Tolerance  Patient tolerated treatment well    Behavior During Therapy  [facility] for tasks assessed/performed       Past Medical History:  Diagnosis Date  . Arthritis    osteoarthritis    Past Surgical History:  Procedure Laterality Date  . KNEE ARTHROSCOPY Right    reconstruction surgery  . KNEE CARTILAGE SURGERY Left   . KNEE CLOSED REDUCTION Left 03/07/2019   Procedure: CLOSED MANIPULATION LEFT  KNEE;  Surgeon: Tarry Kos, MD;  Location: [facility];  Service: Orthopedics;  Laterality: Left;  . LEG SURGERY Right    rods and screws  . TOTAL KNEE ARTHROPLASTY Left 12/09/2018  . TOTAL KNEE ARTHROPLASTY Left 12/09/2018   Procedure: LEFT TOTAL KNEE ARTHROPLASTY;  Surgeon: Tarry Kos, MD;  Location[facility];  Service: Orthopedics;  Laterality: Left;  . WISDOM TOOTH EXTRACTION      There were no vitals filed for this visit.  Subjective Assessment - 04/18/19 1506    Subjective  My knee is doing good, I can now work without pain. I just wish I could bend it better    Limitations  Lifting;Standing;Walking    How long can you stand comfortably?  hour    How long can you walk comfortably?  hour    Patient Stated Goals  get her ROM back so she can put on her shoes, walk down steps    Pain Onset  More than a month ago                       [facility] Adult PT Treatment/Exercise - 04/18/19 0001      Knee/Hip Exercises: Stretches   Knee: Self-Stretch Limitations  lunge stretch for flexion with Lt foot on 8 inch step 10 sec X 10 reps      Knee/Hip Exercises: Aerobic   Nustep  L7 X 8 min      Knee/Hip Exercises: Plyometrics   Other Plyometric Exercises  87 lbs on plyo leg press DL 3 x 15      Knee/Hip Exercises: Standing   Other Standing Knee Exercises  up/down stairs reciprocally using bilat UE support X 3 sets of stairs in hall                  PT Long Term Goals - 04/08/19 1252      PT LONG TERM GOAL #1   Title  Pt will be I and compliant with HEP.    Status  On-going    Target Date  05/06/19      PT LONG TERM GOAL #2   Title  Pt will report less than 3/10 overall pain with usual activity and return to work duties    Baseline  2/23: up to 7/10 at end of shift  Status  On-going    Target Date  05/06/19      PT LONG TERM GOAL #3   Title  Pt will improve Lt knee ROM to 0-110 to improve overall function.    Baseline  2/23: 0-93    Status  On-going    Target Date  05/06/19      PT LONG TERM GOAL #4   Title  Pt will be able to ambulate community distances and stairs WFL gait pattern without complaints    Status  On-going    Target Date  05/06/19            Plan - 04/18/19 1508    Clinical Impression Statement  focused on knee flexion ROM as this continues to be her only limiting deficit. Session ended early as she had to take a personal phone call and requested to finish early. PT will continue with agressive ROM as tolerated.    Personal Factors and Comorbidities  Time since onset of injury/illness/exacerbation;Past/Current Experience    Examination-Activity Limitations  Bend;Squat;Stairs;Stand;Lift;Locomotion Level    Examination-Participation Restrictions  Cleaning;Community Activity;Shop;Laundry;Other    Stability/Clinical Decision Making  Evolving/Moderate complexity    Rehab Potential  Fair    PT Frequency  Other (comment)   2-3x/wk   PT  Duration  4 weeks    PT Treatment/Interventions  ADLs/Self Care Home Management;Aquatic Therapy;Cryotherapy;Electrical Stimulation;Moist Heat;Ultrasound;Gait training;Stair training;Functional mobility training;Therapeutic activities;Therapeutic exercise;Balance training;Neuromuscular re-education;Manual techniques;Passive range of motion;Dry needling;Joint Manipulations;Taping    PT Next Visit Plan  MD visit this week, PN next visit recommended.    PT Home Exercise Plan  Access Code: [email or web address]    Consulted and Agree with Plan of Care  Patient       Patient will benefit from skilled therapeutic intervention in order to improve the following deficits and impairments:  Abnormal gait, Decreased activity tolerance, Decreased mobility, Decreased endurance, Decreased range of motion, Decreased strength, Hypomobility, Difficulty walking, Increased fascial restricitons, Increased muscle spasms, Impaired flexibility, Pain  Visit Diagnosis: Stiffness of left knee, not elsewhere classified  Other abnormalities of gait and mobility  Localized edema  Acute pain of left knee  Muscle weakness (generalized)     Problem List Patient Active Problem List   Diagnosis Date Noted  . Status post total left knee replacement 12/09/2018    Debbe Odea, PT,DPT 04/18/2019, 3:14 PM  [facility] [address] [place], [place], [address] Phone: [phone]   Fax:  [phone]  Name: Jodi Roberts MRN: [id number] Date of Birth: 12-02-1990

## 2019-04-22 ENCOUNTER — Encounter: Payer: BC Managed Care – PPO | Admitting: Physical Therapy

## 2019-04-23 ENCOUNTER — Encounter: Payer: BC Managed Care – PPO | Admitting: Physical Therapy

## 2019-04-25 ENCOUNTER — Ambulatory Visit (INDEPENDENT_AMBULATORY_CARE_PROVIDER_SITE_OTHER): Payer: BC Managed Care – PPO | Admitting: Physical Therapy

## 2019-04-25 ENCOUNTER — Other Ambulatory Visit: Payer: Self-pay

## 2019-04-25 DIAGNOSIS — R2689 Other abnormalities of gait and mobility: Secondary | ICD-10-CM

## 2019-04-25 DIAGNOSIS — R600 Localized edema: Secondary | ICD-10-CM | POA: Diagnosis not present

## 2019-04-25 DIAGNOSIS — M6281 Muscle weakness (generalized): Secondary | ICD-10-CM

## 2019-04-25 DIAGNOSIS — M25662 Stiffness of left knee, not elsewhere classified: Secondary | ICD-10-CM | POA: Diagnosis not present

## 2019-04-25 DIAGNOSIS — M25562 Pain in left knee: Secondary | ICD-10-CM | POA: Diagnosis not present

## 2019-04-25 NOTE — Therapy (Addendum)
[facility] [place], [place], [address] Phone: [phone]   Fax:  [phone]  Physical Therapy Treatment/Discharge Note  Patient Details  Name: Jodi Roberts MRN: [id number] Date of Birth: 1990/04/05 Referring Provider (PT): Susy Manor MD   Encounter Date: 04/25/2019  PT End of Session - 04/25/19 1441    Visit Number  18    Number of Visits  28    Date for PT Re-Evaluation  05/06/19    Authorization Type  BCBS    PT Start Time  1400    PT Stop Time  1440    PT Time Calculation (min)  40 min    Activity Tolerance  Patient tolerated treatment well    Behavior During Therapy  [facility] for tasks assessed/performed       Past Medical History:  Diagnosis Date  . Arthritis    osteoarthritis    Past Surgical History:  Procedure Laterality Date  . KNEE ARTHROSCOPY Right    reconstruction surgery  . KNEE CARTILAGE SURGERY Left   . KNEE CLOSED REDUCTION Left 03/07/2019   Procedure: CLOSED MANIPULATION LEFT  KNEE;  Surgeon: Leandrew Koyanagi, MD;  Location: [place];  Service: Orthopedics;  Laterality: Left;  . LEG SURGERY Right    rods and screws  . TOTAL KNEE ARTHROPLASTY Left 12/09/2018  . TOTAL KNEE ARTHROPLASTY Left 12/09/2018   Procedure: LEFT TOTAL KNEE ARTHROPLASTY;  Surgeon: Leandrew Koyanagi, MD;  Location: [place];  Service: Orthopedics;  Laterality: Left;  . WISDOM TOOTH EXTRACTION      There were no vitals filed for this visit.  Subjective Assessment - 04/25/19 1417    Subjective  no pain just stiffness, I am getting the dynamic knee fexion stretching machine so I feel I can drop down to one time per week of PT    Limitations  Lifting;Standing;Walking    How long can you stand comfortably?  hour    How long can you walk comfortably?  hour    Patient Stated Goals  get her ROM back so she can put on her shoes, walk down steps    Currently in Pain?  No/denies    Pain Onset  More than a month ago          [facility] PT Assessment - 04/25/19 0001      Assessment   Medical Diagnosis  L TKA 12/09/2018    Referring Provider (PT)  Susy Manor MD      PROM   Left Knee Extension  0    Left Knee Flexion  97       [facility] Adult PT Treatment/Exercise - 04/25/19 0001      Knee/Hip Exercises: Stretches   Knee: Self-Stretch Limitations  lunge stretch for flexion with Lt foot on 8 inch step 10 sec X 15 reps    Other Knee/Hip Stretches  seated heelslides with OP of other leg 10 sec X 10 reps      Knee/Hip Exercises: Aerobic   Nustep  L7X10 min for knee ROM      Knee/Hip Exercises: Plyometrics   Other Plyometric Exercises  100 lbs on plyo leg press 3X15      Knee/Hip Exercises: Standing   Other Standing Knee Exercises  lateral step down and back up for Lt knee strength and flexion ROM on 8 inch step X 15 reps      Manual Therapy   Manual therapy comments  seated knee  flexion with contract/relax, gentle distraction; prone knee flexion                  PT Long Term Goals - 04/08/19 1252      PT LONG TERM GOAL #1   Title  Pt will be I and compliant with HEP.    Status  On-going    Target Date  05/06/19      PT LONG TERM GOAL #2   Title  Pt will report less than 3/10 overall pain with usual activity and return to work duties    Baseline  2/23: up to 7/10 at end of shift    Status  On-going    Target Date  05/06/19      PT LONG TERM GOAL #3   Title  Pt will improve Lt knee ROM to 0-110 to improve overall function.    Baseline  2/23: 0-93    Status  On-going    Target Date  05/06/19      PT LONG TERM GOAL #4   Title  Pt will be able to ambulate community distances and stairs WFL gait pattern without complaints    Status  On-going    Target Date  05/06/19            Plan - 04/25/19 1441    Clinical Impression Statement  Her only deficits is knee flexion ROM. Other than that no complaints or pain. She will be getting low load long duration  stretching device for home to stretch her knee flexion thus she can drop down to one time per week per her request.    Personal Factors and Comorbidities  Time since onset of injury/illness/exacerbation;Past/Current Experience    Examination-Activity Limitations  Bend;Squat;Stairs;Stand;Lift;Locomotion Level    Examination-Participation Restrictions  Cleaning;Community Activity;Shop;Laundry;Other    Stability/Clinical Decision Making  Evolving/Moderate complexity    Rehab Potential  Fair    PT Frequency  Other (comment)   2-3x/wk   PT Duration  4 weeks    PT Treatment/Interventions  ADLs/Self Care Home Management;Aquatic Therapy;Cryotherapy;Electrical Stimulation;Moist Heat;Ultrasound;Gait training;Stair training;Functional mobility training;Therapeutic activities;Therapeutic exercise;Balance training;Neuromuscular re-education;Manual techniques;Passive range of motion;Dry needling;Joint Manipulations;Taping    PT Next Visit Plan  MD visit this week, PN next visit recommended.    PT Home Exercise Plan  Access Code: [email or web address]    Consulted and Agree with Plan of Care  Patient       Patient will benefit from skilled therapeutic intervention in order to improve the following deficits and impairments:  Abnormal gait, Decreased activity tolerance, Decreased mobility, Decreased endurance, Decreased range of motion, Decreased strength, Hypomobility, Difficulty walking, Increased fascial restricitons, Increased muscle spasms, Impaired flexibility, Pain  Visit Diagnosis: Stiffness of left knee, not elsewhere classified  Other abnormalities of gait and mobility  Localized edema  Acute pain of left knee  Muscle weakness (generalized)     Problem List Patient Active Problem List   Diagnosis Date Noted  . 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  Patient goals were not met. Patient is being discharged due to not returning since the last visit.  ?????    Scot Jun, PT, DPT, OCS, ATC 07/08/19  4:31 PM       [facility]  [address] [place], [place], [address] Phone: [phone]   Fax:  [phone]  Name: Jodi Roberts MRN: [id number] Date of Birth: 21-Jan-1991

## 2019-04-28 ENCOUNTER — Other Ambulatory Visit: Payer: Self-pay | Admitting: Physician Assistant

## 2019-04-28 ENCOUNTER — Telehealth: Payer: Self-pay | Admitting: Orthopaedic Surgery

## 2019-04-28 MED ORDER — AMOXICILLIN 500 MG PO CAPS: ORAL_CAPSULE | ORAL | 0 refills | Status: DC

## 2019-04-28 NOTE — Telephone Encounter (Signed)
I just sent in

## 2019-04-28 NOTE — Telephone Encounter (Signed)
LMOM for patient letting her know we sent this in for her

## 2019-04-28 NOTE — Telephone Encounter (Signed)
Patient called. Says she has a dentist appointment tomorrow and will need Amoxicillin called in. She has only 2 left. Her call back number is [phone]

## 2019-04-29 ENCOUNTER — Encounter: Payer: BC Managed Care – PPO | Admitting: Physical Therapy

## 2019-05-01 ENCOUNTER — Encounter: Payer: BC Managed Care – PPO | Admitting: Physical Therapy

## 2019-05-02 ENCOUNTER — Encounter: Payer: BC Managed Care – PPO | Admitting: Physical Therapy

## 2019-05-06 ENCOUNTER — Encounter: Payer: BC Managed Care – PPO | Admitting: Physical Therapy

## 2019-05-08 ENCOUNTER — Encounter: Payer: BC Managed Care – PPO | Admitting: Physical Therapy

## 2019-05-09 ENCOUNTER — Encounter: Payer: BC Managed Care – PPO | Admitting: Physical Therapy

## 2019-05-20 ENCOUNTER — Telehealth: Payer: Self-pay | Admitting: Family Medicine

## 2019-05-20 NOTE — Telephone Encounter (Signed)
Copied from CRM [id number]. Topic: Quick Communication - Rx Refill/Question >> May 20, 2019 10:07 AM Randol Kern wrote: Medication: albuterol (PROVENTIL HFA;VENTOLIN HFA) 108 (90 Base) MCG/ACT inhaler + cetirizine (ZYRTEC) 10 MG tablet    Has the patient contacted their pharmacy? Yes.   (Agent: If no, request that the patient contact the pharmacy for the refill.) (Agent: If yes, when and what did the pharmacy advise?)  Preferred Pharmacy (with phone number or street name): [id number] [place], [place] - [address] AT [facility] [address] [place] [address] Phone: [phone] Fax: [phone]    Agent: Please be advised that RX refills may take up to 3 business days. We ask that you follow-up with your pharmacy.

## 2019-05-20 NOTE — Telephone Encounter (Signed)
Pt just got refill 05/14/2019 for 30 days does not need another

## 2019-06-18 ENCOUNTER — Other Ambulatory Visit: Payer: Self-pay

## 2019-06-18 ENCOUNTER — Encounter: Payer: Self-pay | Admitting: Orthopaedic Surgery

## 2019-06-18 ENCOUNTER — Ambulatory Visit: Payer: Self-pay

## 2019-06-18 ENCOUNTER — Ambulatory Visit (INDEPENDENT_AMBULATORY_CARE_PROVIDER_SITE_OTHER): Payer: BC Managed Care – PPO | Admitting: Orthopaedic Surgery

## 2019-06-18 VITALS — Ht 65.0 in | Wt 220.0 lb

## 2019-06-18 DIAGNOSIS — Z96652 Presence of left artificial knee joint: Secondary | ICD-10-CM

## 2019-06-18 MED ORDER — NAPROXEN 500 MG PO TABS: 500.0000 mg | ORAL_TABLET | Freq: Two times a day (BID) | ORAL | 3 refills | Status: DC

## 2019-06-18 NOTE — Progress Notes (Signed)
Office Visit Note   Patient: Jodi Roberts           Date of Birth: Feb 08, 1991           MRN: [id number] Visit Date: 06/18/2019              Requested by: No referring provider defined for this encounter. PCP: Patient, No Pcp Per   Assessment & Plan: Visit Diagnoses:  1. Status post total left knee replacement     Plan: Impression is 6 months status post left total knee replacement with residual stiffness.  We again discussed the option of ERMI device but she has looked into this and it is cost prohibitive.  We sent in a prescription for naproxen.  She will be more aggressive with home exercises.  I do not recommend any arthroscopic intervention or remanipulation at this point nor do I think that it is of much benefit.  I would like to see her back in another couple months for recheck of her range of motion.  In the meantime she will be more aggressive with her exercises and range of motion.  Follow-Up Instructions: Return in about 2 months (around 08/18/2019).   Orders:  Orders Placed This Encounter  Procedures  . XR Knee 1-2 Views Left   Meds ordered this encounter  Medications  . naproxen (NAPROSYN) 500 MG tablet    Sig: Take 1 tablet (500 mg total) by mouth 2 (two) times daily with a meal.    Dispense:  60 tablet    Refill:  3      Procedures: No procedures performed   Clinical Data: No additional findings.   Subjective: Chief Complaint  Patient presents with  . Left Knee - Follow-up    Jodi Roberts is 6 months status post left total knee replacement 3 months status post manipulation for stiffness.  She reports no pain but she is mainly having difficulty with stiffness.  She has particular difficulty with going down stairs and putting on shoes.  She reports that she is doing home exercises twice a day but at work she is standing all day and she feels that this is causing stiffness.   Review of Systems  Constitutional: Negative.   HENT: Negative.   Eyes: Negative.     Respiratory: Negative.   Cardiovascular: Negative.   Endocrine: Negative.   Musculoskeletal: Negative.   Neurological: Negative.   Hematological: Negative.   Psychiatric/Behavioral: Negative.   All other systems reviewed and are negative.    Objective: Vital Signs: Ht 5\' 5"  (1.651 m)   Wt 220 lb (99.8 kg)   BMI 36.61 kg/m   Physical Exam Vitals and nursing note reviewed.  Constitutional:      Appearance: She is well-developed.  Pulmonary:     Effort: Pulmonary effort is normal.  Skin:    General: Skin is warm.     Capillary Refill: Capillary refill takes less than 2 seconds.  Neurological:     Mental Status: She is alert and oriented to person, place, and time.  Psychiatric:        Behavior: Behavior normal.        Thought Content: Thought content normal.        Judgment: Judgment normal.     Ortho Exam Left knee shows a fully healed surgical scar.  Range of motion is 0 to 95 degrees.  Stable to varus valgus.  No signs of infection. Specialty Comments:  No specialty comments available.  Imaging: XR Knee 1-2  Views Left  Result Date: 06/18/2019 Stable total knee replacement in good alignment.     PMFS History: Patient Active Problem List   Diagnosis Date Noted  . 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 Arthritis    osteoarthritis    Family History  Family history unknown: Yes    Past Surgical History:  Procedure Laterality Date  . KNEE ARTHROSCOPY Right    reconstruction surgery  . KNEE CARTILAGE SURGERY Left   . KNEE CLOSED REDUCTION Left 03/07/2019   Procedure: CLOSED MANIPULATION LEFT  KNEE;  Surgeon: Leandrew Koyanagi, MD;  Location: [facility];  Service: Orthopedics;  Laterality: Left;  . LEG SURGERY Right    rods and screws  . TOTAL KNEE ARTHROPLASTY Left 12/09/2018  . TOTAL KNEE ARTHROPLASTY Left 12/09/2018   Procedure: LEFT TOTAL KNEE ARTHROPLASTY;  Surgeon: Leandrew Koyanagi, MD;   Location: [place];  Service: Orthopedics;  Laterality: Left;  . WISDOM TOOTH EXTRACTION     Social History   Occupational History  . Not on file  Tobacco Use  . Smoking status: Never Smoker  . Smokeless tobacco: Never Used  Substance and Sexual Activity  . Alcohol use: Never  . Drug use: Never  . Sexual activity: Yes    Birth control/protection: Condom

## 2019-06-19 ENCOUNTER — Telehealth: Payer: Self-pay | Admitting: Orthopaedic Surgery

## 2019-06-19 NOTE — Telephone Encounter (Signed)
See message.

## 2019-06-19 NOTE — Telephone Encounter (Signed)
Patient called.   She is concerned about mixing the vaccine with her current situation. Requesting a call back to discuss.   Call back [phone]

## 2019-06-20 NOTE — Telephone Encounter (Signed)
Will you check and see what vaccine she is speaking of and what her current situation is?

## 2019-06-20 NOTE — Telephone Encounter (Signed)
Spoke with patient. She wanted to make sure that it was okay to get the covid vaccine since having a total knee arthroplasty last year. Spoke with Mardella Layman and reassured patient it was okay to do.

## 2019-08-19 ENCOUNTER — Ambulatory Visit (INDEPENDENT_AMBULATORY_CARE_PROVIDER_SITE_OTHER): Payer: BC Managed Care – PPO | Admitting: Orthopaedic Surgery

## 2019-08-19 DIAGNOSIS — Z96652 Presence of left artificial knee joint: Secondary | ICD-10-CM | POA: Diagnosis not present

## 2019-08-19 NOTE — Progress Notes (Signed)
   Office Visit Note   Patient: Jodi Roberts           Date of Birth: [birth year]           MRN: [id number] Visit Date: 08/19/2019              Requested by: No referring provider defined for this encounter. PCP: Patient, No Pcp Per   Assessment & Plan: Visit Diagnoses:  1. Status post total left knee replacement     Plan: She is 8 months status post left total knee replacement.  Her range of motion is progressing slowly.  I encouraged her at be more aggressive about her exercises so she can increase her range of motion.  Otherwise I will see her back in 4 months for 1 year visit with two-view x-rays of the left knee.  Follow-Up Instructions: Return in about 4 months (around 12/20/2019).   Orders:  No orders of the defined types were placed in this encounter.  No orders of the defined types were placed in this encounter.     Procedures: No procedures performed   Clinical Data: No additional findings.   Subjective: Chief Complaint  Patient presents with  . Left Knee - Follow-up    Jodi Roberts is a month status post left total knee replacement.  She is here for checkup of her range of motion.  She admits to not doing much in terms of her home exercises.  Reports no pain.   Review of Systems   Objective: Vital Signs: There were no vitals taken for this visit.  Physical Exam  Ortho Exam Left knee shows improved range of motion from 0 degrees to 95 degrees. Specialty Comments:  No specialty comments available.  Imaging: No results found.   PMFS History: Patient Active Problem List   Diagnosis Date Noted  . 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 Arthritis    osteoarthritis    Family History  Family history unknown: Yes    Past Surgical History:  Procedure Laterality Date  . KNEE ARTHROSCOPY Right    reconstruction surgery  . KNEE CARTILAGE SURGERY Left   . KNEE CLOSED REDUCTION Left 03/07/2019    Procedure: CLOSED MANIPULATION LEFT  KNEE;  Surgeon: Tarry Kos, MD;  Location: [facility];  Service: Orthopedics;  Laterality: Left;  . LEG SURGERY Right    rods and screws  . TOTAL KNEE ARTHROPLASTY Left 12/09/2018  . TOTAL KNEE ARTHROPLASTY Left 12/09/2018   Procedure: LEFT TOTAL KNEE ARTHROPLASTY;  Surgeon: Tarry Kos, MD;  [facility];  Service: Orthopedics;  Laterality: Left;  . WISDOM TOOTH EXTRACTION     Social History   Occupational History  . Not on file  Tobacco Use  . Smoking status: Never Smoker  . Smokeless tobacco: Never Used  Vaping Use  . Vaping Use: Never used  Substance and Sexual Activity  . Alcohol use: Never  . Drug use: Never  . Sexual activity: Yes    Birth control/protection: Condom

## 2019-09-23 DIAGNOSIS — M9901 Segmental and somatic dysfunction of cervical region: Secondary | ICD-10-CM | POA: Diagnosis not present

## 2019-09-23 DIAGNOSIS — M9903 Segmental and somatic dysfunction of lumbar region: Secondary | ICD-10-CM | POA: Diagnosis not present

## 2019-09-23 DIAGNOSIS — M5382 Other specified dorsopathies, cervical region: Secondary | ICD-10-CM | POA: Diagnosis not present

## 2019-09-23 DIAGNOSIS — M5386 Other specified dorsopathies, lumbar region: Secondary | ICD-10-CM | POA: Diagnosis not present

## 2019-09-24 DIAGNOSIS — M9901 Segmental and somatic dysfunction of cervical region: Secondary | ICD-10-CM | POA: Diagnosis not present

## 2019-09-24 DIAGNOSIS — M9903 Segmental and somatic dysfunction of lumbar region: Secondary | ICD-10-CM | POA: Diagnosis not present

## 2019-09-24 DIAGNOSIS — M5386 Other specified dorsopathies, lumbar region: Secondary | ICD-10-CM | POA: Diagnosis not present

## 2019-09-24 DIAGNOSIS — M5382 Other specified dorsopathies, cervical region: Secondary | ICD-10-CM | POA: Diagnosis not present

## 2019-09-29 ENCOUNTER — Ambulatory Visit: Payer: BC Managed Care – PPO | Admitting: Family Medicine

## 2019-09-29 ENCOUNTER — Ambulatory Visit (INDEPENDENT_AMBULATORY_CARE_PROVIDER_SITE_OTHER): Payer: BC Managed Care – PPO

## 2019-09-29 ENCOUNTER — Other Ambulatory Visit: Payer: Self-pay | Admitting: Family Medicine

## 2019-09-29 ENCOUNTER — Encounter: Payer: Self-pay | Admitting: Family Medicine

## 2019-09-29 ENCOUNTER — Other Ambulatory Visit: Payer: Self-pay

## 2019-09-29 VITALS — BP 114/80 | HR 77 | Temp 97.6°F | Ht 65.0 in | Wt 225.2 lb

## 2019-09-29 DIAGNOSIS — M25532 Pain in left wrist: Secondary | ICD-10-CM

## 2019-09-29 LAB — POCT URINE PREGNANCY: Preg Test, Ur: NEGATIVE

## 2019-09-29 NOTE — Progress Notes (Signed)
8/16/20219:30 AM  Jodi Roberts 11/18/1990, 29 y.o., female [id number]  Chief Complaint  Patient presents with  . Pain    left wrist injury, requesting imaging. Not sure of onset of the pain. Did fall 2 wks ago. Does not feel the fall injured wrist any more than before    HPI:   Patient is a 29 y.o. female who presents today for left wrist pain  2 months of left wrist pain along ulnar side with intermittent swelling Denies any inciting event She did fall onto 2 weeks ago but this has not been worse Left handed Pain with certain movements or lifting heavier objects No numbness, tingling or weakness She has not tried anything for this  Depression screen PHQ 2/9 03/25/2019  Decreased Interest 0  Down, Depressed, Hopeless 0  PHQ - 2 Score 0    Fall Risk  09/29/2019 03/25/2019  Falls in the past year? 1 0  Number falls in past yr: 0 0  Injury with Fall? 0 0     Allergies  Allergen Reactions  . Shellfish Allergy Anaphylaxis and Swelling    Throat closes Only with eating.  Ok to touch iodine or betadine.  . Sulfa Antibiotics Anaphylaxis    Prior to Admission medications   Medication Sig Start Date End Date Taking? Authorizing Provider  albuterol (PROVENTIL HFA;VENTOLIN HFA) 108 (90 Base) MCG/ACT inhaler Inhale 1-2 puffs into the lungs every 6 (six) hours as needed for wheezing or shortness of breath. 05/14/18  Yes Eustace Moore, MD  cetirizine (ZYRTEC) 10 MG tablet Take 1 tablet (10 mg total) by mouth daily. Patient taking differently: Take 10 mg by mouth daily as needed (during [facility] ONLY for allergies.).  05/14/18  Yes Eustace Moore, MD    Past Medical History:  Diagnosis Date  . Arthritis    osteoarthritis    Past Surgical History:  Procedure Laterality Date  . KNEE ARTHROSCOPY Right    reconstruction surgery  . KNEE CARTILAGE SURGERY Left   . KNEE CLOSED REDUCTION Left 03/07/2019   Procedure: CLOSED MANIPULATION LEFT  KNEE;  Surgeon: Tarry Kos,  MD;  Location: [facility];  Service: Orthopedics;  Laterality: Left;  . LEG SURGERY Right    rods and screws  . TOTAL KNEE ARTHROPLASTY Left 12/09/2018  . TOTAL KNEE ARTHROPLASTY Left 12/09/2018   Procedure: LEFT TOTAL KNEE ARTHROPLASTY;  Surgeon: Tarry Kos, MD;  [facility];  Service: Orthopedics;  Laterality: Left;  . WISDOM TOOTH EXTRACTION      Social History   Tobacco Use  . Smoking status: Never Smoker  . Smokeless tobacco: Never Used  Substance Use Topics  . Alcohol use: Never    Family History  Family history unknown: Yes    ROS Per hpi  OBJECTIVE:  Today's Vitals   09/29/19 0914  BP: 114/80  Pulse: 77  Temp: 97.6 F (36.4 C)  SpO2: 100%  Weight: 225 lb 3.2 oz (102.2 kg)  Height: 5\' 5"  (1.651 m)   Body mass index is 37.48 kg/m.   Physical Exam Vitals and nursing note reviewed.  Constitutional:      Appearance: She is well-developed.  HENT:     Head: Normocephalic and atraumatic.  Eyes:     General: No scleral icterus.    Conjunctiva/sclera: Conjunctivae normal.     Pupils: Pupils are equal, round, and reactive to light.  Pulmonary:     Effort: Pulmonary effort is normal.  Musculoskeletal:  Left elbow: Normal.     Left forearm: Normal.     Left wrist: Swelling (minimal swelling) and tenderness (along ulnar side) present. No effusion, bony tenderness or crepitus. Normal range of motion. Normal pulse.     Cervical back: Neck supple.     Comments: Distally NVI  Skin:    General: Skin is warm and dry.  Neurological:     Mental Status: She is alert and oriented to person, place, and time.     Results for orders placed or performed in visit on 09/29/19 (from the past 24 hour(s))  POCT urine pregnancy     Status: Normal   Collection Time: 09/29/19  9:26 AM  Result Value Ref Range   Preg Test, Ur Negative Negative    DG Wrist Complete Left  Result Date: 09/29/2019 CLINICAL DATA:  LEFT wrist pain of unspecified  duration EXAM: LEFT WRIST - COMPLETE 3+ VIEW COMPARISON:  None FINDINGS: Osseous mineralization normal. Joint spaces preserved. No acute fracture, dislocation, or bone destruction. IMPRESSION: Normal exam. Electronically Signed   By: Ulyses Southward M.D.   On: 09/29/2019 09:35     ASSESSMENT and PLAN  1. Left wrist pain Normal xray. Favor tendonitis. Discussed RICE therapy, ibu prn, consider referral if persistent.  - POCT urine pregnancy - neg  Return if symptoms worsen or fail to improve.    Myles Lipps, MD Primary Care at [facility] [address] [place], [place] [address] Ph.  [phone] Fax [phone]

## 2019-09-29 NOTE — Patient Instructions (Addendum)
If you have lab work done today you will be contacted with your lab results within the next 2 weeks.  If you have not heard from [place] then please contact us. The fastest way to get your results is to register for My Chart.   IF you received an x-ray today, you will receive an invoice from [facility]. Please contact [facility] at [phone] with questions or concerns regarding your invoice.   IF you received labwork today, you will receive an invoice from [facility]. Please contact [facility] at [phone] with questions or concerns regarding your invoice.   Our billing staff will not be able to assist you with questions regarding bills from these companies.  You will be contacted with the lab results as soon as they are available. The fastest way to get your results is to activate your My Chart account. Instructions are located on the last page of this paperwork. If you have not heard from [place] regarding the results in 2 weeks, please contact this office.     Wrist Pain, Adult There are many things that can cause wrist pain. Some common causes include:  An injury to the wrist area.  Overuse of the joint.  A condition that causes too much pressure to be put on a nerve in the wrist (carpal tunnel syndrome).  Wear and tear of the joints that happens as a person gets older (osteoarthritis).  Other types of arthritis. Sometimes, the cause of wrist pain is not known. Often, the pain goes away when you follow your doctor's instructions for helping pain at home, such as resting or icing your wrist. If your wrist pain does not go away, it is important to tell your doctor. Follow these instructions at home:  Rest the wrist area for 48 hours or more, or as long as told by your doctor.  If a splint or elastic bandage has been put on your wrist, use it as told by your doctor. ? Take off the splint or bandage only as told by your doctor. ? Loosen the splint or bandage if  your fingers tingle, lose feeling (get numb), or turn cold or blue.  If directed, apply ice to the injured area: ? If you have a removable splint or elastic bandage, remove it as told by your doctor. ? Put ice in a plastic bag. ? Place a towel between your skin and the bag or between your splint or bandage and the bag. ? Leave the ice on for 20 minutes, 2-3 times a day.   Keep your arm raised (elevated) above the level of your heart while you are sitting or lying down.  Take over-the-counter and prescription medicines only as told by your doctor.  Keep all follow-up visits as told by your doctor. This is important. Contact a doctor if:  You have a sudden sharp pain in the wrist, hand, or arm that is different or new.  The swelling or bruising on your wrist or hand gets worse.  Your skin becomes red, gets a rash, or has open sores.  Your pain does not get better or it gets worse. Get help right away if:  You lose feeling in your fingers or hand.  Your fingers turn white, very red, or cold and blue.  You cannot move your fingers.  You have a fever or chills. This information is not intended to replace advice given to you by your health care provider. Make sure you discuss any questions you  have with your health care provider. Document Revised: 01/12/2017 Document Reviewed: 08/19/2015 Elsevier Patient Education  2020 [employer].

## 2019-10-10 ENCOUNTER — Encounter: Payer: Self-pay | Admitting: Physician Assistant

## 2019-10-10 ENCOUNTER — Ambulatory Visit: Payer: Self-pay

## 2019-10-10 ENCOUNTER — Ambulatory Visit (INDEPENDENT_AMBULATORY_CARE_PROVIDER_SITE_OTHER): Payer: BC Managed Care – PPO | Admitting: Orthopaedic Surgery

## 2019-10-10 DIAGNOSIS — M7521 Bicipital tendinitis, right shoulder: Secondary | ICD-10-CM | POA: Diagnosis not present

## 2019-10-10 DIAGNOSIS — M25511 Pain in right shoulder: Secondary | ICD-10-CM

## 2019-10-10 MED ORDER — DICLOFENAC SODIUM 75 MG PO TBEC: 75.0000 mg | DELAYED_RELEASE_TABLET | Freq: Two times a day (BID) | ORAL | 1 refills | Status: DC
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
## 2019-10-28 ENCOUNTER — Ambulatory Visit: Payer: BC Managed Care – PPO | Admitting: Physical Therapy

## 2019-12-23 ENCOUNTER — Ambulatory Visit: Payer: Self-pay

## 2019-12-23 ENCOUNTER — Ambulatory Visit (INDEPENDENT_AMBULATORY_CARE_PROVIDER_SITE_OTHER): Payer: BC Managed Care – PPO | Admitting: Orthopaedic Surgery

## 2019-12-23 ENCOUNTER — Other Ambulatory Visit: Payer: Self-pay

## 2019-12-23 ENCOUNTER — Encounter: Payer: Self-pay | Admitting: Orthopaedic Surgery

## 2019-12-23 DIAGNOSIS — Z96652 Presence of left artificial knee joint: Secondary | ICD-10-CM | POA: Diagnosis not present

## 2019-12-23 NOTE — Progress Notes (Signed)
   Post-Op Visit Note   Patient: Jodi Roberts           Date of Birth: 07-21-1990           MRN: [id number] Visit Date: 12/23/2019 PCP: Myles Lipps, MD (Inactive)   Assessment & Plan:  Chief Complaint:  Chief Complaint  Patient presents with  . Left Knee - Routine Post Op   Visit Diagnoses:  1. S/P total knee replacement, left     Plan: Patient is a pleasant 29 year old female who comes in today 1 year out left total knee replacement in approximately 9 months out left manipulation.  She is doing very well without complaints.  She has no pain.  She still lacks a little bit with flexion but is happy with where she is at now.  She is able to work without any issues.  Examination of her left knee reveals range of motion from 0 to about 110 degrees.  She is stable valgus varus stress.  She is neurovascular intact distally.  At this point, she will continue to advance with activity as tolerated.  Dental prophylaxis reinforced.  Follow-up with [place] in 1 years time for repeat evaluation and 2 view x-rays of left knee.  Call with concerns or questions.  Follow-Up Instructions: Return in about 1 year (around 12/22/2020).   Orders:  Orders Placed This Encounter  Procedures  . XR Knee 1-2 Views Left   No orders of the defined types were placed in this encounter.   Imaging: No new imaging  PMFS History: Patient Active Problem List   Diagnosis Date Noted  . 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 Arthritis    osteoarthritis    Family History  Family history unknown: Yes    Past Surgical History:  Procedure Laterality Date  . KNEE ARTHROSCOPY Right    reconstruction surgery  . KNEE CARTILAGE SURGERY Left   . KNEE CLOSED REDUCTION Left 03/07/2019   Procedure: CLOSED MANIPULATION LEFT  KNEE;  Surgeon: Tarry Kos, MD;  Location: [facility];  Service: Orthopedics;  Laterality: Left;  . LEG SURGERY Right     rods and screws  . TOTAL KNEE ARTHROPLASTY Left 12/09/2018  . TOTAL KNEE ARTHROPLASTY Left 12/09/2018   Procedure: LEFT TOTAL KNEE ARTHROPLASTY;  Surgeon: Tarry Kos, MD;  Location: [facility];  Service: Orthopedics;  Laterality: Left;  . WISDOM TOOTH EXTRACTION     Social History   Occupational History  . Not on file  Tobacco Use  . Smoking status: Never Smoker  . Smokeless tobacco: Never Used  Vaping Use  . Vaping Use: Never used  Substance and Sexual Activity  . Alcohol use: Never  . Drug use: Never  . Sexual activity: Yes    Birth control/protection: Condom

## 2020-03-30 ENCOUNTER — Ambulatory Visit: Payer: BC Managed Care – PPO | Admitting: Orthopaedic Surgery

## 2020-04-06 ENCOUNTER — Other Ambulatory Visit: Payer: Self-pay

## 2020-04-06 ENCOUNTER — Ambulatory Visit: Payer: Self-pay

## 2020-04-06 ENCOUNTER — Ambulatory Visit (INDEPENDENT_AMBULATORY_CARE_PROVIDER_SITE_OTHER): Payer: BC Managed Care – PPO | Admitting: Orthopaedic Surgery

## 2020-04-06 ENCOUNTER — Telehealth: Payer: Self-pay | Admitting: Orthopaedic Surgery

## 2020-04-06 ENCOUNTER — Encounter: Payer: Self-pay | Admitting: Orthopaedic Surgery

## 2020-04-06 DIAGNOSIS — Z96652 Presence of left artificial knee joint: Secondary | ICD-10-CM | POA: Diagnosis not present

## 2020-04-06 MED ORDER — MELOXICAM 7.5 MG PO TABS: 7.5000 mg | ORAL_TABLET | Freq: Two times a day (BID) | ORAL | 2 refills | Status: DC | PRN

## 2020-04-06 NOTE — Progress Notes (Signed)
Office Visit Note   Patient: Jodi Roberts           Date of Birth: 12/08/1990           MRN: [id number] Visit Date: 04/06/2020              Requested by: Lezlie Lye, Meda Coffee, MD [address] [place],  [place] [address] PCP: Lezlie Lye, Meda Coffee, MD   Assessment & Plan: Visit Diagnoses:  1. Status post total left knee replacement     Plan: Impression is left knee pain likely due to overuse and inflammation.  We will take her out of work for 6weeks initially to see how she responds from this period of rest.  She will also use ice and compression and I have sent in a prescription for 2-week course of meloxicam.  We will reevaluate how she is doing in 6 weeks.  Follow-Up Instructions: Return in about 6 weeks (around 05/18/2020).   Orders:  Orders Placed This Encounter  Procedures  . XR Knee 1-2 Views Left   No orders of the defined types were placed in this encounter.     Procedures: No procedures performed   Clinical Data: No additional findings.   Subjective: Chief Complaint  Patient presents with  . Left Knee - Pain    Jan he is status post left uncemented total knee replacement.  She works as a Production designer, theatre/television/film at [employer] but recently is her as it is too physically demanding.  She has been out of work since February 17 due to pain mainly with activity and with standing as well.  Denies any injuries.  Denies any constitutional symptoms.   Review of Systems  Constitutional: Negative.   HENT: Negative.   Eyes: Negative.   Respiratory: Negative.   Cardiovascular: Negative.   Endocrine: Negative.   Musculoskeletal: Negative.   Neurological: Negative.   Hematological: Negative.   Psychiatric/Behavioral: Negative.   All other systems reviewed and are negative.    Objective: Vital Signs: There were no vitals taken for this visit.  Physical Exam Vitals and nursing note reviewed.  Constitutional:      Appearance: She is well-developed and well-nourished.   Pulmonary:     Effort: Pulmonary effort is normal.  Skin:    General: Skin is warm.     Capillary Refill: Capillary refill takes less than 2 seconds.  Neurological:     Mental Status: She is alert and oriented to person, place, and time.  Psychiatric:        Mood and Affect: Mood and affect normal.        Behavior: Behavior normal.        Thought Content: Thought content normal.        Judgment: Judgment normal.     Ortho Exam Left knee shows a moderate effusion.  Surgical scars fully healed.  No evidence of infection.  Good range of motion of the knee. Specialty Comments:  No specialty comments available.  Imaging: XR Knee 1-2 Views Left  Result Date: 04/06/2020 Stable total knee replacement in good alignment without any evidence of loosening.    PMFS History: Patient Active Problem List   Diagnosis Date Noted  . 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 Arthritis    osteoarthritis    Family History  Family history unknown: Yes    Past Surgical History:  Procedure Laterality Date  . KNEE ARTHROSCOPY Right  reconstruction surgery  . KNEE CARTILAGE SURGERY Left   . KNEE CLOSED REDUCTION Left 03/07/2019   Procedure: CLOSED MANIPULATION LEFT  KNEE;  Surgeon: Tarry Kos, MD;  Location: [facility];  Service: Orthopedics;  Laterality: Left;  . LEG SURGERY Right    rods and screws  . TOTAL KNEE ARTHROPLASTY Left 12/09/2018  . TOTAL KNEE ARTHROPLASTY Left 12/09/2018   Procedure: LEFT TOTAL KNEE ARTHROPLASTY;  Surgeon: Tarry Kos, MD;  Location: [facility];  Service: Orthopedics;  Laterality: Left;  . WISDOM TOOTH EXTRACTION     Social History   Occupational History  . Not on file  Tobacco Use  . Smoking status: Never Smoker  . Smokeless tobacco: Never Used  Vaping Use  . Vaping Use: Never used  Substance and Sexual Activity  . Alcohol use: Never  . Drug use: Never  . Sexual activity: Yes     Birth control/protection: Condom

## 2020-04-06 NOTE — Telephone Encounter (Signed)
Patient submitted medical release form, short term FMLA disablility, and $25.00 money order to [employer]. Accepted 04/06/20

## 2020-05-11 IMAGING — DX CHEST - 2 VIEW
2 series · 2 of 2 positions shown · non-contrast
Comparison: None.

CLINICAL DATA: Shortness of breath

EXAM:
CHEST - 2 VIEW

[chest pa]
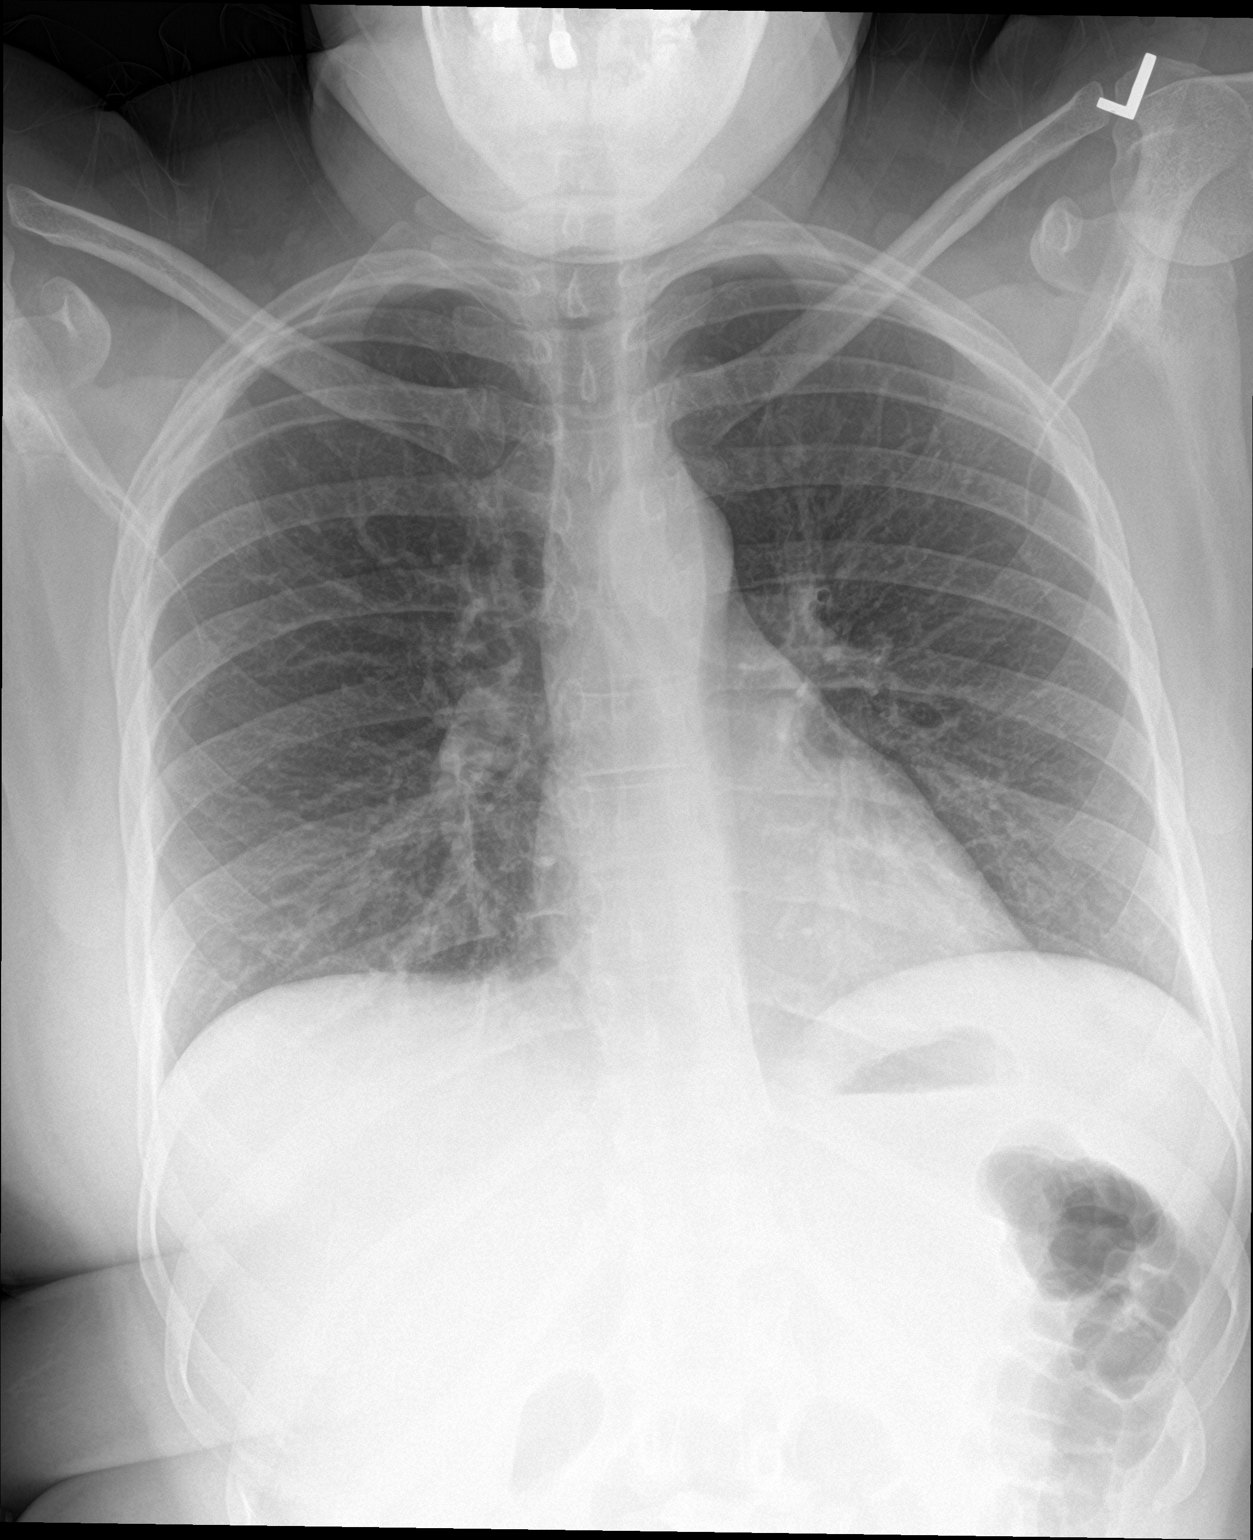

[chest lat]
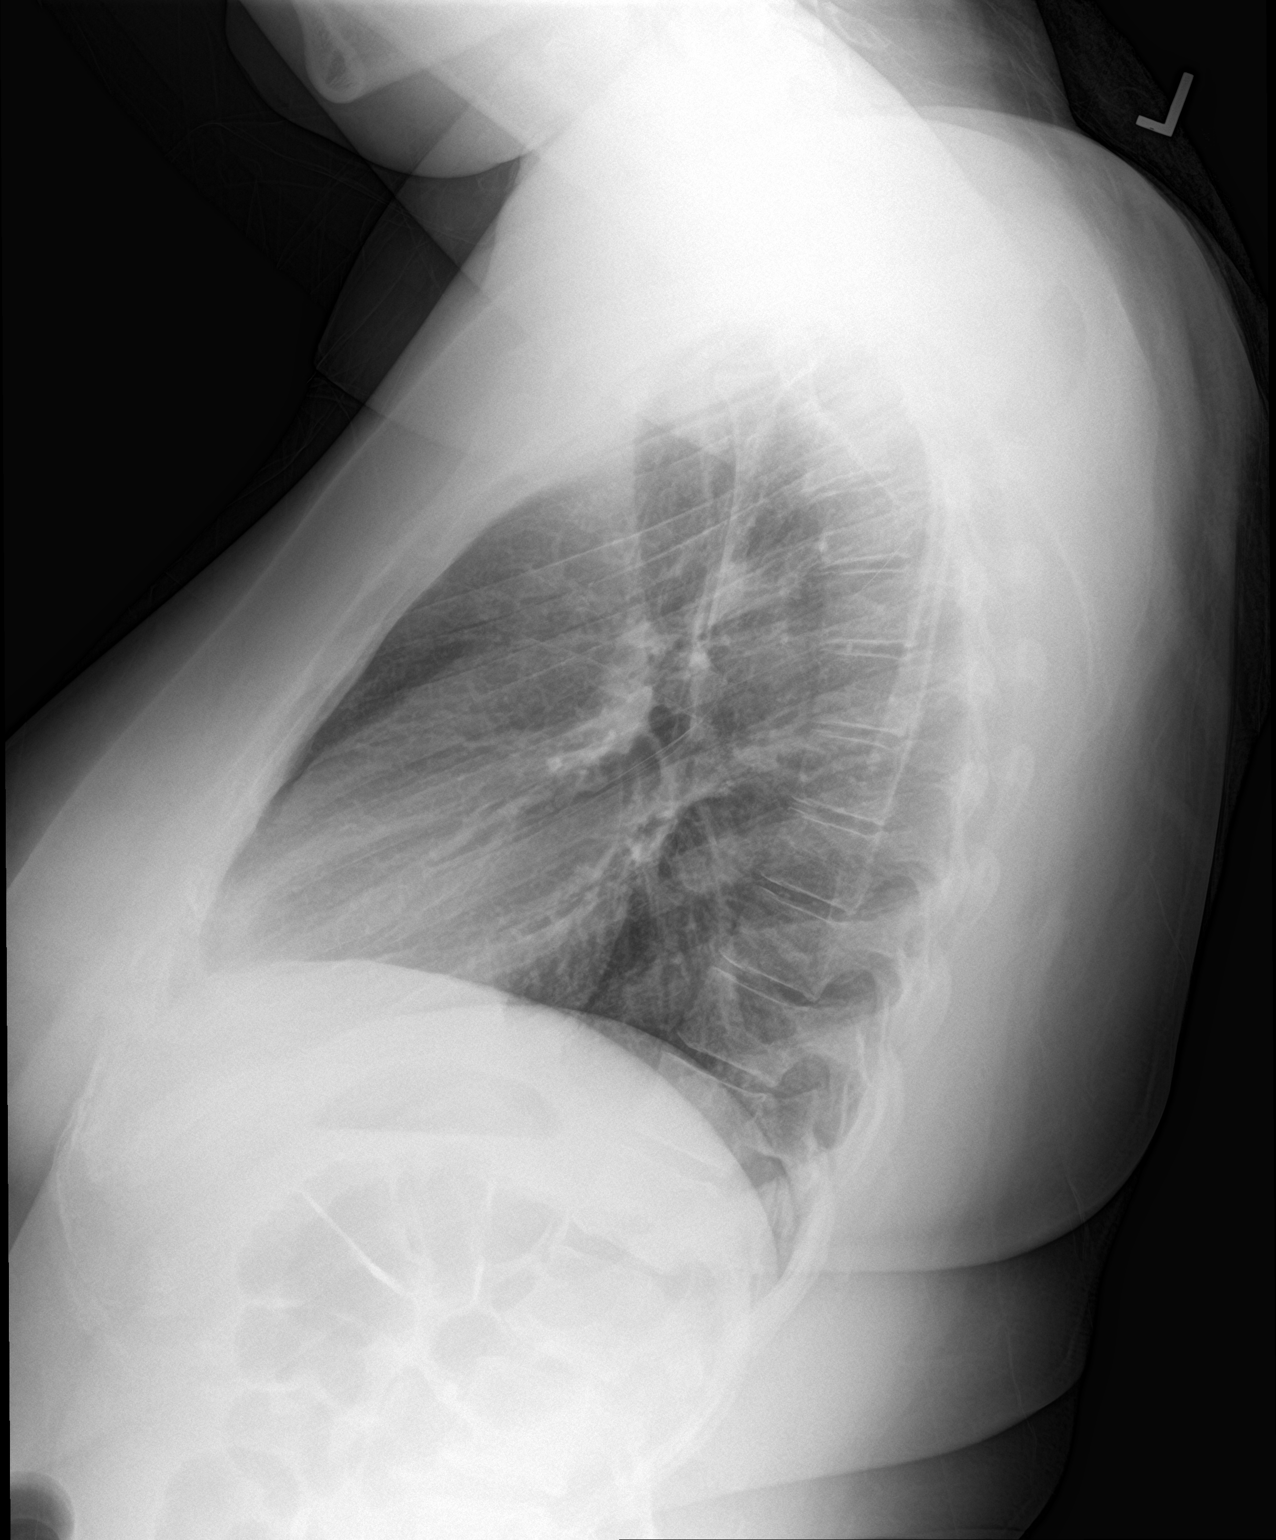

[2 of 2 positions shown; findings below may reference images not displayed]

FINDINGS: Heart and mediastinal contours are within normal limits. No focal
opacities or effusions. No acute bony abnormality.
IMPRESSION: No active cardiopulmonary disease.

## 2020-05-18 ENCOUNTER — Encounter: Payer: Self-pay | Admitting: Orthopaedic Surgery

## 2020-05-18 ENCOUNTER — Ambulatory Visit (INDEPENDENT_AMBULATORY_CARE_PROVIDER_SITE_OTHER): Payer: BC Managed Care – PPO | Admitting: Orthopaedic Surgery

## 2020-05-18 VITALS — Ht 65.0 in | Wt 225.0 lb

## 2020-05-18 DIAGNOSIS — Z96652 Presence of left artificial knee joint: Secondary | ICD-10-CM | POA: Diagnosis not present

## 2020-05-18 NOTE — Progress Notes (Signed)
Office Visit Note   Patient: Jodi Roberts           Date of Birth: 09-Apr-1990           MRN: [id number] Visit Date: 05/18/2020              Requested by: Lezlie Lye, Meda Coffee, MD [address] [place],  [place] [address] PCP: Lezlie Lye, Meda Coffee, MD   Assessment & Plan: Visit Diagnoses:  1. Status post total left knee replacement     Plan: Jan he is doing well but still has some progress to make.  Would like to hold her out of work for another 4 weeks so that she can continue to improve her strength and range of motion.  Work note provided for return to work on May 1.  We will see her back in about 6 months for her 2year total knee checkup.  Follow-Up Instructions: Return in about 6 months (around 11/17/2020).   Orders:  No orders of the defined types were placed in this encounter.  No orders of the defined types were placed in this encounter.     Procedures: No procedures performed   Clinical Data: No additional findings.   Subjective: Chief Complaint  Patient presents with  . Left Knee - Follow-up    Jodi Roberts returns today for follow-up of left knee pain.  She has been doing an exercise class at the gym 3 times a week.  She is doing much better.  Her range of motion is improving but she still having some difficulty with strength and flexibility and endurance to standing.   Review of Systems  Constitutional: Negative.   HENT: Negative.   Eyes: Negative.   Respiratory: Negative.   Cardiovascular: Negative.   Endocrine: Negative.   Musculoskeletal: Negative.   Neurological: Negative.   Hematological: Negative.   Psychiatric/Behavioral: Negative.   All other systems reviewed and are negative.    Objective: Vital Signs: Ht 5\' 5"  (1.651 m)   Wt 225 lb (102.1 kg)   BMI 37.44 kg/m   Physical Exam Vitals and nursing note reviewed.  Constitutional:      Appearance: She is well-developed.  Pulmonary:     Effort: Pulmonary effort is normal.   Skin:    General: Skin is warm.     Capillary Refill: Capillary refill takes less than 2 seconds.  Neurological:     Mental Status: She is alert and oriented to person, place, and time.  Psychiatric:        Behavior: Behavior normal.        Thought Content: Thought content normal.        Judgment: Judgment normal.     Ortho Exam Left knee shows a fully healed surgical scar.  Minimal swelling.  Range of motion is 0 to 105 degrees. Specialty Comments:  No specialty comments available.  Imaging: No results found.   PMFS History: Patient Active Problem List   Diagnosis Date Noted  . 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 Arthritis    osteoarthritis    Family History  Family history unknown: Yes    Past Surgical History:  Procedure Laterality Date  . KNEE ARTHROSCOPY Right    reconstruction surgery  . KNEE CARTILAGE SURGERY Left   . KNEE CLOSED REDUCTION Left 03/07/2019   Procedure: CLOSED MANIPULATION LEFT  KNEE;  Surgeon: 03/09/2019, MD;  Location: [facility];  Service: Orthopedics;  Laterality: Left;  . LEG SURGERY Right    rods and screws  . TOTAL KNEE ARTHROPLASTY Left 12/09/2018  . TOTAL KNEE ARTHROPLASTY Left 12/09/2018   Procedure: LEFT TOTAL KNEE ARTHROPLASTY;  Surgeon: Tarry Kos, MD;  Location: [facility];  Service: Orthopedics;  Laterality: Left;  . WISDOM TOOTH EXTRACTION     Social History   Occupational History  . Not on file  Tobacco Use  . Smoking status: Never Smoker  . Smokeless tobacco: Never Used  Vaping Use  . Vaping Use: Never used  Substance and Sexual Activity  . Alcohol use: Never  . Drug use: Never  . Sexual activity: Yes    Birth control/protection: Condom

## 2020-10-21 ENCOUNTER — Encounter: Payer: Self-pay | Admitting: Family Medicine

## 2020-11-17 ENCOUNTER — Other Ambulatory Visit: Payer: Self-pay

## 2020-11-17 ENCOUNTER — Ambulatory Visit: Payer: Self-pay

## 2020-11-17 ENCOUNTER — Encounter: Payer: Self-pay | Admitting: Orthopaedic Surgery

## 2020-11-17 ENCOUNTER — Ambulatory Visit (INDEPENDENT_AMBULATORY_CARE_PROVIDER_SITE_OTHER): Payer: BC Managed Care – PPO | Admitting: Orthopaedic Surgery

## 2020-11-17 DIAGNOSIS — Z96652 Presence of left artificial knee joint: Secondary | ICD-10-CM

## 2020-11-17 NOTE — Progress Notes (Signed)
   Post-Op Visit Note   Patient: Jodi Roberts           Date of Birth: May 09, 1990           MRN: [id number] Visit Date: 11/17/2020 PCP: Lezlie Lye, Meda Coffee, MD   Assessment & Plan:  Chief Complaint:  Chief Complaint  Patient presents with   Left Knee - Routine Post Op, Follow-up   Visit Diagnoses:  1. Status post total left knee replacement   2. History of total knee replacement, left     Plan: Patient is a pleasant 30 year old female who comes in today 1 year out left total knee replacement, date of surgery 12/09/2019.  She has been doing well.  She has regained a fair amount of range of motion.  She has minimal pain.  She does note she changed jobs from standing all day at [employer] to a sedentary job at [employer] which has significantly helped her pain.  Overall, doing much better.  Examination of the left knee reveals range of motion from 0 to 115 degrees.  She is stable vascular stress.  She is neurovascular intact distally.  At this point, she will continue to increase activity as tolerated.  Dental prophylaxis reinforced for another year.  Follow-up with [place] in 1 years time for repeat evaluation and AP and lateral x-rays of the left knee.  Call with concerns or questions.  Follow-Up Instructions: Return in about 1 year (around 11/17/2021).   Orders:  Orders Placed This Encounter  Procedures   XR Knee 1-2 Views Left   No orders of the defined types were placed in this encounter.   Imaging: XR Knee 1-2 Views Left  Result Date: 11/17/2020 Well-seated prosthesis without complication   PMFS History: Patient Active Problem List   Diagnosis Date Noted   Status post total left knee replacement 12/09/2018   Past Medical History:  Diagnosis Date   Arthritis    osteoarthritis    Family History  Family history unknown: Yes    Past Surgical History:  Procedure Laterality Date   KNEE ARTHROSCOPY Right    reconstruction surgery   KNEE CARTILAGE SURGERY Left    KNEE CLOSED  REDUCTION Left 03/07/2019   Procedure: CLOSED MANIPULATION LEFT  KNEE;  Surgeon: Tarry Kos, MD;  Location: [facility];  Service: Orthopedics;  Laterality: Left;   LEG SURGERY Right    rods and screws   TOTAL KNEE ARTHROPLASTY Left 12/09/2018   TOTAL KNEE ARTHROPLASTY Left 12/09/2018   Procedure: LEFT TOTAL KNEE ARTHROPLASTY;  Surgeon: Tarry Kos, MD;  Location: [facility];  Service: Orthopedics;  Laterality: Left;   WISDOM TOOTH EXTRACTION     Social History   Occupational History   Not on file  Tobacco Use   Smoking status: Never   Smokeless tobacco: Never  Vaping Use   Vaping Use: Never used  Substance and Sexual Activity   Alcohol use: Never   Drug use: Never   Sexual activity: Yes    Birth control/protection: Condom

## 2020-12-06 IMAGING — DX DG KNEE 1-2V PORT*L*
1 series · 2 of 2 positions shown · non-contrast
Comparison: 10/16/2018

CLINICAL DATA: Post left knee replacement

EXAM:
PORTABLE LEFT KNEE - 1-2 VIEW

[Series 1: knee · 0.14mm/px · 2 of 2 slices shown]
[im 1/2]
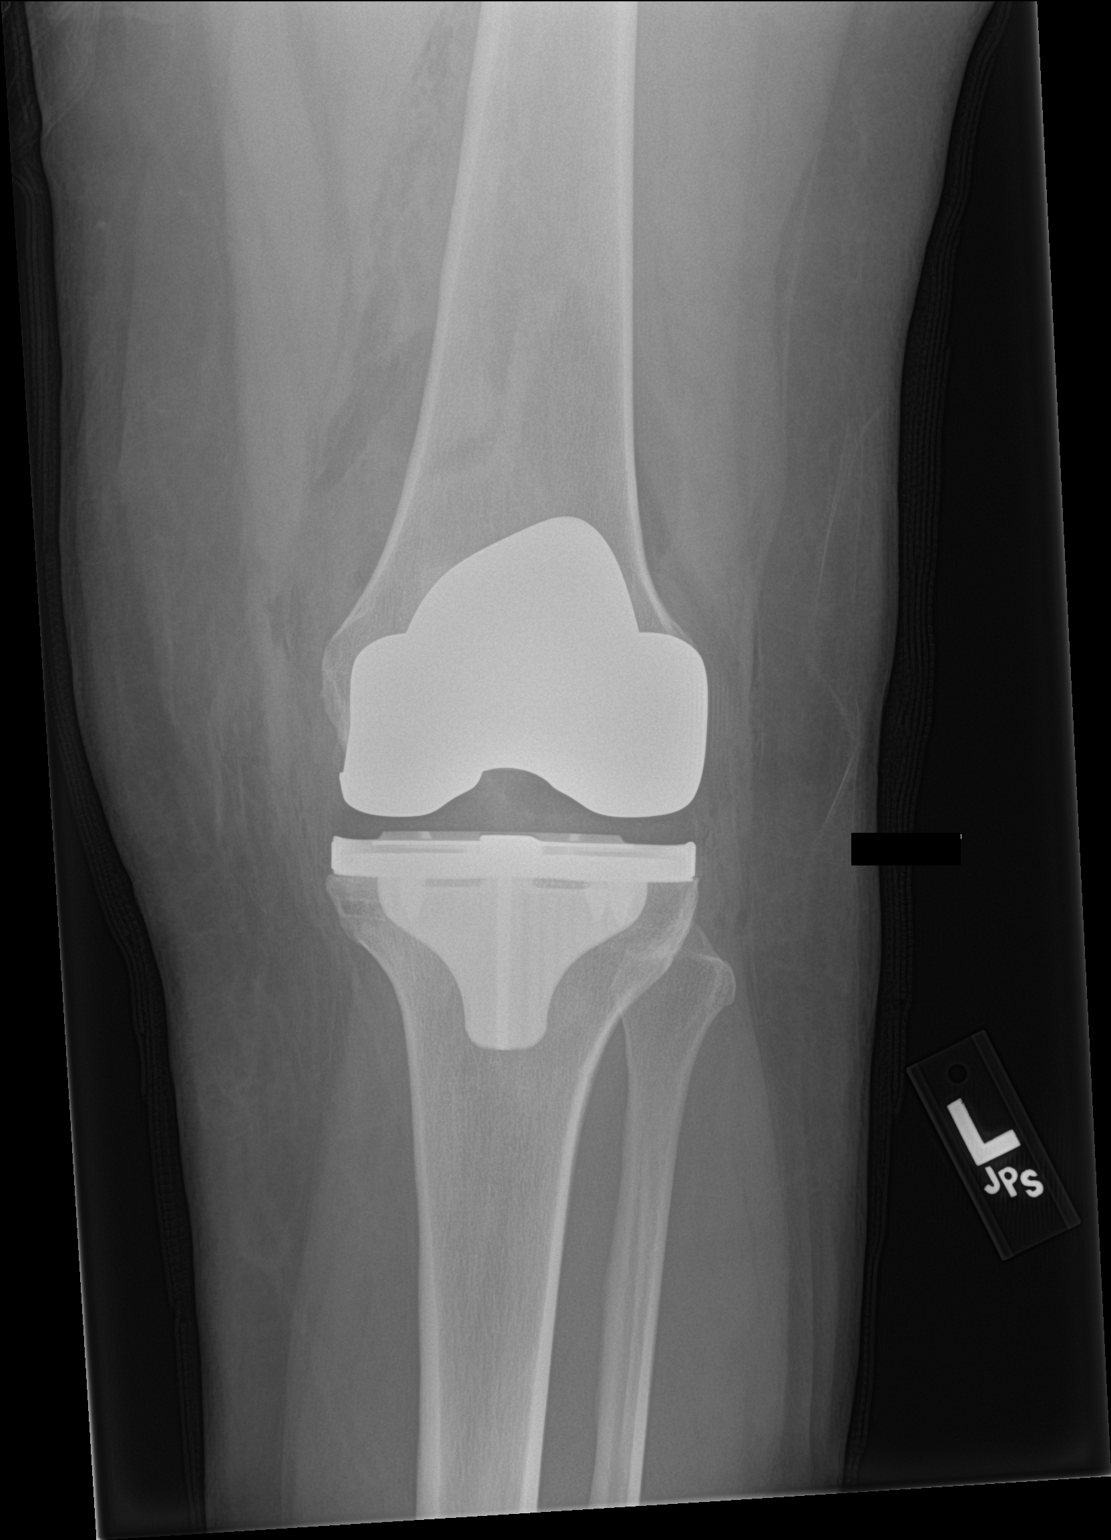
[im 2/2]
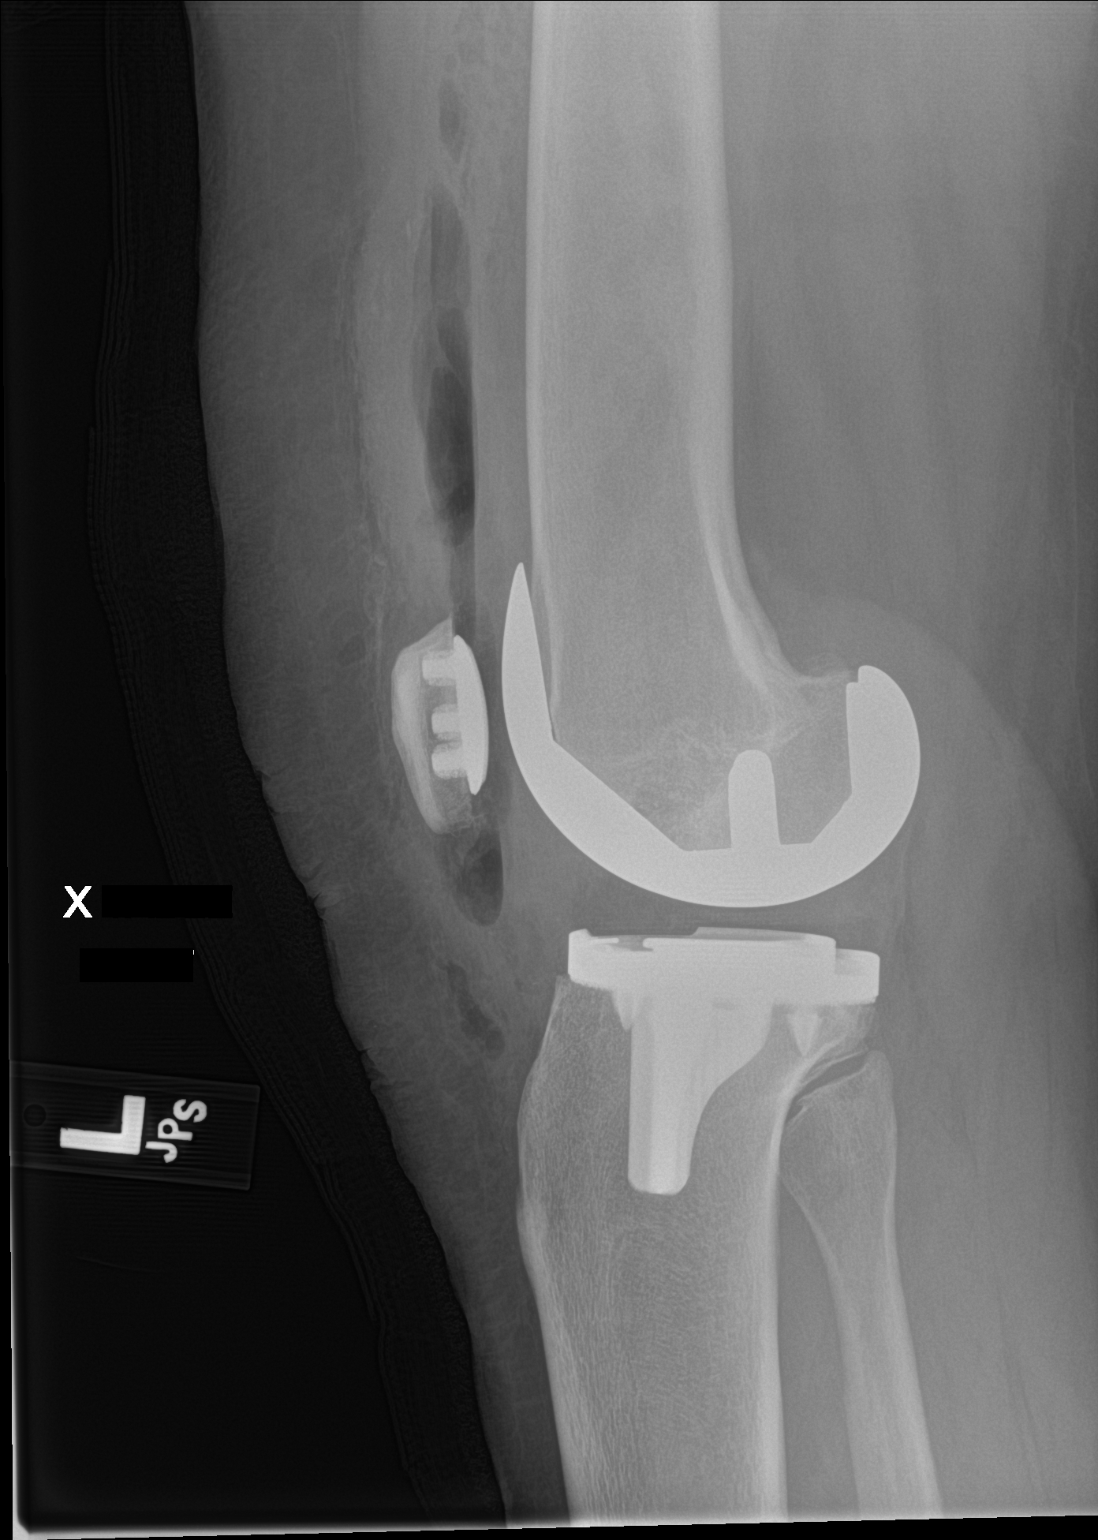

[2 of 2 positions shown; findings below may reference images not displayed]

FINDINGS: Changes of left knee replacement. No hardware or bony complicating
feature. Soft tissue and joint space gas noted.
IMPRESSION: Left knee replacement.  No visible complicating feature.

## 2020-12-21 ENCOUNTER — Ambulatory Visit (INDEPENDENT_AMBULATORY_CARE_PROVIDER_SITE_OTHER): Payer: BC Managed Care – PPO | Admitting: Advanced Practice Midwife

## 2020-12-21 ENCOUNTER — Encounter: Payer: Self-pay | Admitting: Advanced Practice Midwife

## 2020-12-21 ENCOUNTER — Other Ambulatory Visit (HOSPITAL_COMMUNITY)
Admission: RE | Admit: 2020-12-21 | Discharge: 2020-12-21 | Disposition: A | Discharge status: Home / Self Care | Payer: BC Managed Care – PPO | Source: Ambulatory Visit | Attending: Advanced Practice Midwife | Admitting: Advanced Practice Midwife

## 2020-12-21 ENCOUNTER — Other Ambulatory Visit: Payer: Self-pay

## 2020-12-21 ENCOUNTER — Ambulatory Visit: Payer: Self-pay

## 2020-12-21 VITALS — BP 122/75 | HR 88 | Wt 244.0 lb

## 2020-12-21 DIAGNOSIS — Z3400 Encounter for supervision of normal first pregnancy, unspecified trimester: Secondary | ICD-10-CM | POA: Insufficient documentation

## 2020-12-21 DIAGNOSIS — O0993 Supervision of high risk pregnancy, unspecified, third trimester: Secondary | ICD-10-CM | POA: Insufficient documentation

## 2020-12-21 DIAGNOSIS — Z3A10 10 weeks gestation of pregnancy: Secondary | ICD-10-CM | POA: Diagnosis not present

## 2020-12-21 DIAGNOSIS — Z3481 Encounter for supervision of other normal pregnancy, first trimester: Secondary | ICD-10-CM

## 2020-12-21 DIAGNOSIS — Z3480 Encounter for supervision of other normal pregnancy, unspecified trimester: Secondary | ICD-10-CM | POA: Insufficient documentation

## 2020-12-21 MED ORDER — FOLIVANE-OB 85-1 MG PO CAPS: 1.0000 | ORAL_CAPSULE | Freq: Every day | ORAL | 3 refills | Status: DC

## 2020-12-21 MED ORDER — DOXYLAMINE-PYRIDOXINE 10-10 MG PO TBEC: 2.0000 | DELAYED_RELEASE_TABLET | Freq: Every evening | ORAL | 3 refills | Status: DC | PRN

## 2020-12-21 MED ORDER — COMPLETENATE 29-1 MG PO CHEW: 1.0000 | CHEWABLE_TABLET | Freq: Every day | ORAL | 12 refills | Status: DC

## 2020-12-21 MED ORDER — ASPIRIN 81 MG PO CHEW: 81.0000 mg | CHEWABLE_TABLET | Freq: Every day | ORAL | 6 refills | Status: DC

## 2020-12-21 NOTE — Progress Notes (Signed)
bab INITIAL OBSTETRICAL VISIT Patient name: Jodi Roberts MRN [id number]  Date of birth: 1990/04/22 Chief Complaint:   Initial Prenatal Visit  History of Present Illness:   Jodi Roberts is a 30 y.o. G64P0010 African American female at [redacted]w[redacted]d by [place] at 10 weeks with an Estimated Date of Delivery: 07/17/21 being seen today for her initial obstetrical visit.   Her obstetrical history is significant for obesity.   Today she reports nausea.  Depression screen [facility] 2/9 12/21/2020 03/25/2019  Decreased Interest 0 0  Down, Depressed, Hopeless 0 0  PHQ - 2 Score 0 0  Altered sleeping 0 -  Tired, decreased energy 0 -  Change in appetite 0 -  Feeling bad or failure about yourself  0 -  Trouble concentrating 0 -  Moving slowly or fidgety/restless 0 -  Suicidal thoughts 0 -  PHQ-9 Score 0 -    Patient's last menstrual period was 10/10/2020. Last pap 2021. Results were: normal Review of Systems:   Pertinent items are noted in HPI Denies cramping/contractions, leakage of fluid, vaginal bleeding, abnormal vaginal discharge w/ itching/odor/irritation, headaches, visual changes, shortness of breath, chest pain, abdominal pain, severe nausea/vomiting, or problems with urination or bowel movements unless otherwise stated above.  Pertinent History Reviewed:  Reviewed past medical,surgical, social, obstetrical and family history.  Reviewed problem list, medications and allergies. OB History  Gravida Para Term Preterm AB Living  2 0     1    SAB IAB Ectopic Multiple Live Births  1            # Outcome Date GA Lbr Len/2nd Weight Sex Delivery Anes PTL Lv  2 Current           1 SAB 2016           Physical Assessment:   Vitals:   12/21/20 0912  BP: 122/75  Pulse: 88  Weight: 244 lb (110.7 kg)  Body mass index is 40.6 kg/m.       Physical Examination:  General appearance - well appearing, and in no distress  Mental status - alert, oriented to person, place, and time  Psych:  She has a normal mood and  affect  Skin - warm and dry, normal color, no suspicious lesions noted  Chest - effort normal, all lung fields clear to auscultation bilaterally  Heart - normal rate and regular rhythm  Abdomen - soft, nontender  Extremities:  No swelling or varicosities noted  Pelvic - VULVA: normal appearing vulva with no masses, tenderness or lesions  VAGINA: normal appearing vagina with normal color and discharge, no lesions  CERVIX: normal appearing cervix without discharge or lesions, no CMT  Thin prep pap is not done   Chaperone:  Armandina Stammer RN       Assessment & Plan:  1) Low-Risk Pregnancy G2P0010 at [redacted]w[redacted]d with an Estimated Date of Delivery: 07/17/21   2) Initial OB visit  3) Routines reviewed  Meds:  Meds ordered this encounter  Medications   Prenat w/o A Vit-FeFum-FePo-FA (FOLIVANE-OB) 85-1 MG CAPS    Sig: Take 1 capsule by mouth daily.    Dispense:  90 capsule    Refill:  3    Initial labs obtained Continue prenatal vitamins Reviewed n/v relief measures and warning s/s to report Reviewed recommended weight gain based on pre-gravid BMI Encouraged well-balanced diet Genetic & carrier screening discussed: requests Panorama, requests Panorama and Horizon  Ultrasound discussed; fetal survey: requested CCNC completed> form faxed if has or is  planning to apply for medicaid The nature of [name] for [employer] [facility] with multiple MDs and other Advanced Practice Providers was explained to patient; also emphasized that fellows, residents, and students are part of our team.   Indications for ASA therapy (per uptodate) One of the following: H/O preeclampsia, especially early onset/adverse outcome No Multifetal gestation No CHTN No T1DM or T2DM No Chronic kidney disease No Autoimmune disease (antiphospholipid syndrome, systemic lupus erythematosus) No  OR Two or more of the following: Nulliparity No Obesity (BMI>30 kg/m2) yes Family h/o preeclampsia in mother or  sister No Age ?35 years No Sociodemographic characteristics (African American race, low socioeconomic level) Yes Personal risk factors (eg, previous pregnancy w/ LBW or SGA, previous adverse pregnancy outcome [eg, stillbirth], interval >10 years between pregnancies) No  Indications for early A1C (per uptodate) BMI >=25 (>=23 in Asian women) AND one of the following GDM in a previous pregnancy No Previous A1C?5.7, impaired glucose tolerance, or impaired fasting glucose on previous testing No First-degree relative with diabetes No High-risk race/ethnicity (eg, African American, Latino, Native American, Asian American, Pacific Islander) Yes History of cardiovascular disease No HTN or on therapy for hypertension No HDL cholesterol level <35 mg/dL (0.90 mmol/L) and/or a triglyceride level >250 mg/dL (2.82 mmol/L) No PCOS No Physical inactivity No Other clinical condition associated with insulin resistance (eg, severe obesity, acanthosis nigricans) No Previous birth of an infant weighing ?4000 g No Previous stillbirth of unknown cause No >= 40yo No  Follow-up: 4 weeks for visit and Panorama/Horizon   Orders Placed This Encounter  Procedures   Urine Culture   US OB Limited   CBC/D/Plt+RPR+Rh+ABO+RubIgG...   Hepatitis C Antibody   HgB A1c    Hansel Feinstein CNM, [facility] 12/21/2020 9:34 AM

## 2020-12-21 NOTE — Progress Notes (Signed)
Bedside ultrasound complete

## 2020-12-22 LAB — CBC/D/PLT+RPR+RH+ABO+RUBIGG...
Antibody Screen: NEGATIVE
Basophils Absolute: 0 10*3/uL (ref 0.0–0.2)
Basos: 0 %
EOS (ABSOLUTE): 0.1 10*3/uL (ref 0.0–0.4)
Eos: 2 %
HCV Ab: <0.1 s/co ratio (ref 0.0–0.9)
HIV Screen 4th Generation wRfx: NONREACTIVE
Hematocrit: 34.9 % (ref 34.0–46.6)
Hemoglobin: 11.2 g/dL (ref 11.1–15.9)
Hepatitis B Surface Ag: NEGATIVE
Immature Grans (Abs): 0 10*3/uL (ref 0.0–0.1)
Immature Granulocytes: 0 %
Lymphocytes Absolute: 2.1 10*3/uL (ref 0.7–3.1)
Lymphs: 27 %
MCH: 28.4 pg (ref 26.6–33.0)
MCHC: 32.1 g/dL (ref 31.5–35.7)
MCV: 89 fL (ref 79–97)
Monocytes Absolute: 0.6 10*3/uL (ref 0.1–0.9)
Monocytes: 8 %
Neutrophils Absolute: 4.8 10*3/uL (ref 1.4–7.0)
Neutrophils: 63 %
Platelets: 288 10*3/uL (ref 150–450)
RBC: 3.94 x10E6/uL (ref 3.77–5.28)
RDW: 14.8 % (ref 11.7–15.4)
RPR Ser Ql: NONREACTIVE
Rh Factor: POSITIVE
Rubella Antibodies, IGG: 4.18 index (ref >0.99)
WBC: 7.7 10*3/uL (ref 3.4–10.8)

## 2020-12-22 LAB — GC/CHLAMYDIA PROBE AMP (~~LOC~~) NOT AT ARMC
Chlamydia: NEGATIVE
Comment: NEGATIVE
Comment: NORMAL
Neisseria Gonorrhea: NEGATIVE

## 2020-12-22 LAB — HCV INTERPRETATION

## 2020-12-22 LAB — HEPATITIS C ANTIBODY: Hep C Virus Ab: <0.1 s/co ratio (ref 0.0–0.9)

## 2020-12-22 LAB — HEMOGLOBIN A1C
Est. average glucose Bld gHb Est-mCnc: 108 mg/dL
Hgb A1c MFr Bld: 5.4 % (ref 4.8–5.6)

## 2020-12-26 LAB — URINE CULTURE

## 2021-01-18 ENCOUNTER — Other Ambulatory Visit: Payer: Self-pay | Admitting: Physician Assistant

## 2021-01-18 ENCOUNTER — Ambulatory Visit (INDEPENDENT_AMBULATORY_CARE_PROVIDER_SITE_OTHER): Payer: BC Managed Care – PPO | Admitting: Advanced Practice Midwife

## 2021-01-18 ENCOUNTER — Telehealth: Payer: Self-pay | Admitting: Orthopaedic Surgery

## 2021-01-18 ENCOUNTER — Encounter: Payer: Self-pay | Admitting: Advanced Practice Midwife

## 2021-01-18 ENCOUNTER — Other Ambulatory Visit: Payer: Self-pay

## 2021-01-18 VITALS — BP 124/82 | HR 93 | Wt 244.0 lb

## 2021-01-18 DIAGNOSIS — Z3A14 14 weeks gestation of pregnancy: Secondary | ICD-10-CM

## 2021-01-18 DIAGNOSIS — Z3482 Encounter for supervision of other normal pregnancy, second trimester: Secondary | ICD-10-CM | POA: Diagnosis not present

## 2021-01-18 DIAGNOSIS — Z3400 Encounter for supervision of normal first pregnancy, unspecified trimester: Secondary | ICD-10-CM

## 2021-01-18 MED ORDER — AMOXICILLIN 500 MG PO CAPS: ORAL_CAPSULE | ORAL | 0 refills | Status: DC

## 2021-01-18 NOTE — Progress Notes (Signed)
   PRENATAL VISIT NOTE  Subjective:  Jodi Roberts is a 30 y.o. G2P0010 at [redacted]w[redacted]d being seen today for ongoing prenatal care.  She is currently monitored for the following issues for this low-risk pregnancy and has Status post total left knee replacement and Supervision of other normal pregnancy, antepartum on their problem list.  Patient reports no complaints.  Contractions: Not present. Vag. Bleeding: None.  Movement: Absent. Denies leaking of fluid.   The following portions of the patient's history were reviewed and updated as appropriate: allergies, current medications, past family history, past medical history, past social history, past surgical history and problem list.   Objective:   Vitals:   01/18/21 1031  BP: 124/82  Pulse: 93  Weight: 244 lb (110.7 kg)    Fetal Status: Fetal Heart Rate (bpm): 150   Movement: Absent     General:  Alert, oriented and cooperative. Patient is in no acute distress.  Skin: Skin is warm and dry. No rash noted.   Cardiovascular: Normal heart rate noted  Respiratory: Normal respiratory effort, no problems with respiration noted  Abdomen: Soft, gravid, appropriate for gestational age.  Pain/Pressure: Absent     Pelvic: Cervical exam deferred        Extremities: Normal range of motion.  Edema: None  Mental Status: Normal mood and affect. Normal behavior. Normal judgment and thought content.   Assessment and Plan:  Pregnancy: G2P0010 at [redacted]w[redacted]d 1. Supervision of normal first pregnancy, antepartum      Would like Panorama testing      Discussed doing AFP next visit  2. [redacted] weeks gestation of pregnancy     Scheduled for Anatomy US next month  Preterm labor symptoms and general obstetric precautions including but not limited to vaginal bleeding, contractions, leaking of fluid and fetal movement were reviewed in detail with the patient. Please refer to After Visit Summary for other counseling recommendations.   Return in about 4 weeks (around  02/15/2021) for [employer].  Future Appointments  Date Time Provider Department Center  02/15/2021 10:15 AM Aviva Signs, CNM [facility] None  02/21/2021 10:15 AM [facility] US2 [facility]  03/15/2021 10:15 AM Aviva Signs, CNM [facility] None  04/13/2021  8:35 AM Adrian Blackwater Rhona Raider, DO [facility] None    Wynelle Bourgeois, CNM

## 2021-01-18 NOTE — Telephone Encounter (Signed)
Patient aware.

## 2021-01-18 NOTE — Telephone Encounter (Signed)
Sent in

## 2021-01-18 NOTE — Telephone Encounter (Signed)
Pt called asking to have a amoxicillin rx sent in; pt would like a CB to be updated when this is done please.   [phone]

## 2021-02-15 ENCOUNTER — Other Ambulatory Visit: Payer: Self-pay

## 2021-02-15 ENCOUNTER — Ambulatory Visit (INDEPENDENT_AMBULATORY_CARE_PROVIDER_SITE_OTHER): Payer: BC Managed Care – PPO | Admitting: Advanced Practice Midwife

## 2021-02-15 ENCOUNTER — Encounter: Payer: Self-pay | Admitting: Advanced Practice Midwife

## 2021-02-15 VITALS — BP 122/78 | HR 100 | Wt 247.0 lb

## 2021-02-15 DIAGNOSIS — Z3480 Encounter for supervision of other normal pregnancy, unspecified trimester: Secondary | ICD-10-CM

## 2021-02-15 DIAGNOSIS — Z3A18 18 weeks gestation of pregnancy: Secondary | ICD-10-CM

## 2021-02-15 NOTE — Progress Notes (Signed)
° °  PRENATAL VISIT NOTE  Subjective:  Jodi Roberts is a 31 y.o. G2P0010 at [redacted]w[redacted]d being seen today for ongoing prenatal care.  She is currently monitored for the following issues for this low-risk pregnancy and has Status post total left knee replacement and Supervision of other normal pregnancy, antepartum on their problem list.  Patient reports no complaints.  Contractions: Not present. Vag. Bleeding: None.  Movement: Absent. Denies leaking of fluid.   The following portions of the patient's history were reviewed and updated as appropriate: allergies, current medications, past family history, past medical history, past social history, past surgical history and problem list.   Objective:   Vitals:   02/15/21 1019  BP: 122/78  Pulse: 100  Weight: 247 lb (112 kg)    Fetal Status: Fetal Heart Rate (bpm): 154   Movement: Absent     General:  Alert, oriented and cooperative. Patient is in no acute distress.  Skin: Skin is warm and dry. No rash noted.   Cardiovascular: Normal heart rate noted  Respiratory: Normal respiratory effort, no problems with respiration noted  Abdomen: Soft, gravid, appropriate for gestational age.  Pain/Pressure: Absent     Pelvic: Cervical exam deferred        Extremities: Normal range of motion.  Edema: None  Mental Status: Normal mood and affect. Normal behavior. Normal judgment and thought content.   Assessment and Plan:  Pregnancy: G2P0010 at [redacted]w[redacted]d 1. [redacted] weeks gestation of pregnancy      Anatomy US next week  2. Supervision of other normal pregnancy, antepartum   Preterm labor symptoms and general obstetric precautions including but not limited to vaginal bleeding, contractions, leaking of fluid and fetal movement were reviewed in detail with the patient. Please refer to After Visit Summary for other counseling recommendations.   Return in about 4 weeks (around 03/15/2021) for [employer].  Future Appointments  Date Time Provider Department  Center  02/21/2021 10:15 AM [facility] US2 [facility]  03/15/2021 10:15 AM Aviva Signs, CNM [facility] None  04/13/2021  8:35 AM Levie Heritage, DO [facility] None  04/26/2021 10:15 AM Gerrit Heck, CNM [facility] None  05/10/2021  9:55 AM Aviva Signs, CNM [facility] None    Wynelle Bourgeois, CNM

## 2021-02-21 ENCOUNTER — Other Ambulatory Visit: Payer: Self-pay | Admitting: Advanced Practice Midwife

## 2021-02-21 ENCOUNTER — Other Ambulatory Visit: Payer: Self-pay

## 2021-02-21 ENCOUNTER — Other Ambulatory Visit: Payer: Self-pay | Admitting: *Deleted

## 2021-02-21 ENCOUNTER — Ambulatory Visit: Payer: BC Managed Care – PPO | Attending: Advanced Practice Midwife

## 2021-02-21 DIAGNOSIS — O99212 Obesity complicating pregnancy, second trimester: Secondary | ICD-10-CM | POA: Insufficient documentation

## 2021-02-21 DIAGNOSIS — Z3400 Encounter for supervision of normal first pregnancy, unspecified trimester: Secondary | ICD-10-CM | POA: Diagnosis not present

## 2021-02-21 DIAGNOSIS — Z3A19 19 weeks gestation of pregnancy: Secondary | ICD-10-CM | POA: Diagnosis not present

## 2021-02-21 DIAGNOSIS — Z363 Encounter for antenatal screening for malformations: Secondary | ICD-10-CM | POA: Insufficient documentation

## 2021-02-21 DIAGNOSIS — Z6836 Body mass index (BMI) 36.0-36.9, adult: Secondary | ICD-10-CM

## 2021-03-04 IMAGING — CR DG KNEE 1-2V PORT*L*
2 series · 2 of 2 positions shown · non-contrast
Comparison: 01/17/2019.

CLINICAL DATA: Left knee replacement.

EXAM:
PORTABLE LEFT KNEE - 1-2 VIEW

[knee ap]
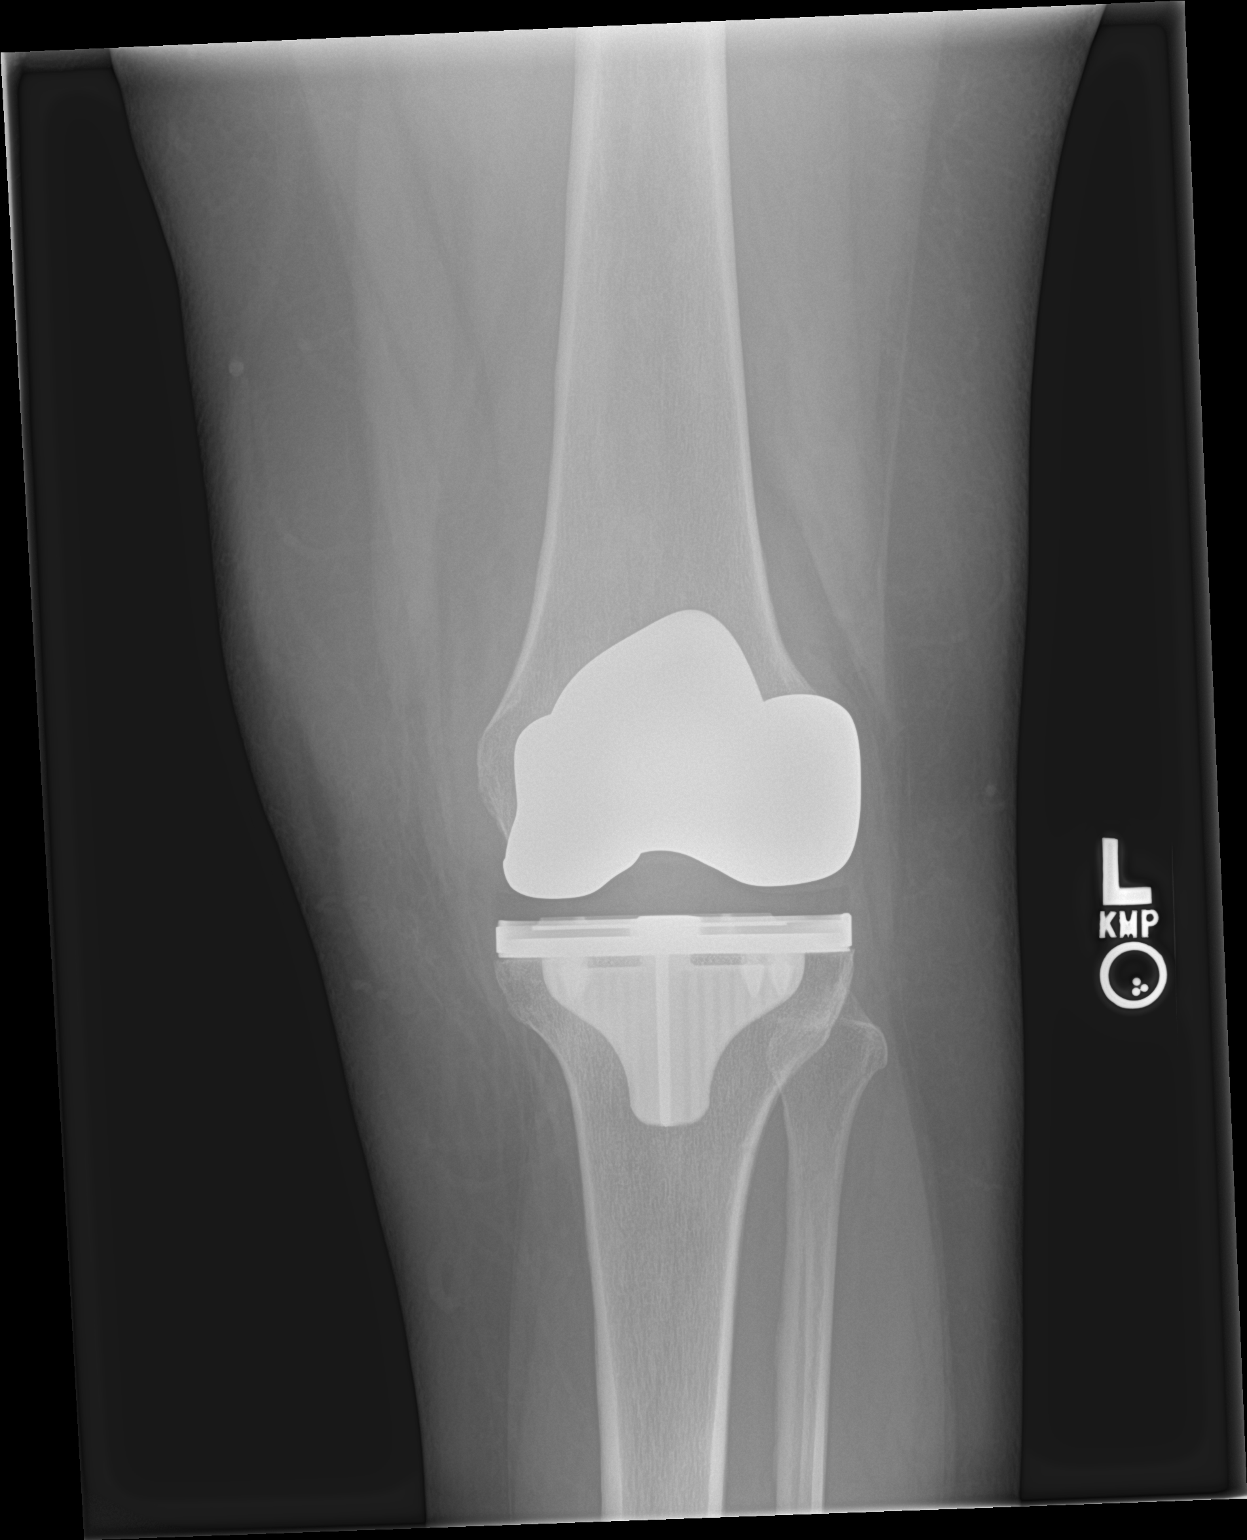

[knee lat]
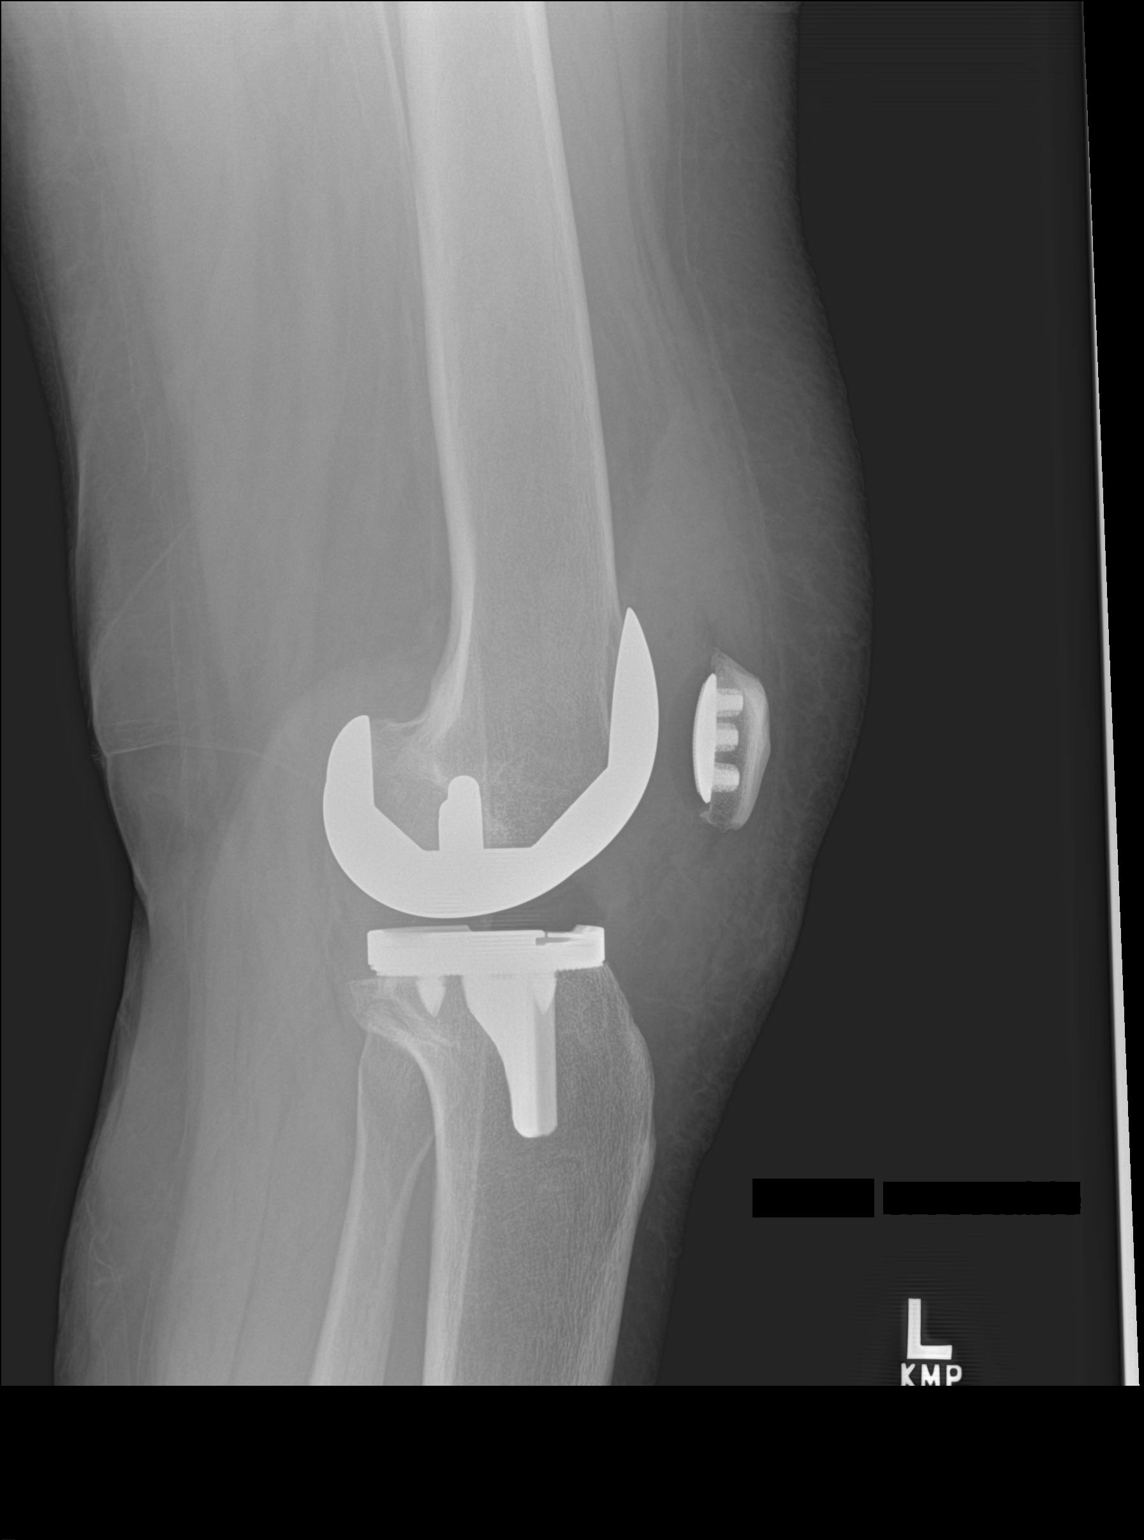

[2 of 2 positions shown; findings below may reference images not displayed]

FINDINGS: Total left knee replacement. Hardware intact. Anatomic alignment. No
acute bony abnormality. Prominent left knee joint effusion.
IMPRESSION: 1. Total left knee replacement. Hardware intact. Anatomic alignment.
No acute bony abnormality.

2.  Prominent left knee joint effusion.

## 2021-03-15 ENCOUNTER — Other Ambulatory Visit: Payer: Self-pay

## 2021-03-15 ENCOUNTER — Ambulatory Visit (INDEPENDENT_AMBULATORY_CARE_PROVIDER_SITE_OTHER): Payer: BC Managed Care – PPO | Admitting: Advanced Practice Midwife

## 2021-03-15 VITALS — BP 113/71 | HR 123 | Wt 248.0 lb

## 2021-03-15 DIAGNOSIS — Z3A22 22 weeks gestation of pregnancy: Secondary | ICD-10-CM

## 2021-03-15 NOTE — Progress Notes (Signed)
° °  PRENATAL VISIT NOTE  Subjective:  Jodi Roberts is a 31 y.o. G2P0010 at [redacted]w[redacted]d being seen today for ongoing prenatal care.  She is currently monitored for the following issues for this low-risk pregnancy and has Status post total left knee replacement and Supervision of other normal pregnancy, antepartum on their problem list.  Patient reports no complaints.  Contractions: Not present. Vag. Bleeding: None.  Movement: Present. Denies leaking of fluid.   The following portions of the patient's history were reviewed and updated as appropriate: allergies, current medications, past family history, past medical history, past social history, past surgical history and problem list.   Objective:   Vitals:   03/15/21 1016  BP: 113/71  Pulse: (!) 123  Weight: 248 lb (112.5 kg)    Fetal Status: Fetal Heart Rate (bpm): 160 Fundal Height: 22 cm Movement: Present     General:  Alert, oriented and cooperative. Patient is in no acute distress.  Skin: Skin is warm and dry. No rash noted.   Cardiovascular: Normal heart rate noted  Respiratory: Normal respiratory effort, no problems with respiration noted  Abdomen: Soft, gravid, appropriate for gestational age.  Pain/Pressure: Present     Pelvic: Cervical exam deferred        Extremities: Normal range of motion.  Edema: None  Mental Status: Normal mood and affect. Normal behavior. Normal judgment and thought content.   Assessment and Plan:  Pregnancy: G2P0010 at [redacted]w[redacted]d 1. [redacted] weeks gestation of pregnancy     Plan glucola next month     Reviewed normal precautions  Preterm labor symptoms and general obstetric precautions including but not limited to vaginal bleeding, contractions, leaking of fluid and fetal movement were reviewed in detail with the patient. Please refer to After Visit Summary for other counseling recommendations.     Future Appointments  Date Time Provider Department Center  04/13/2021  8:35 AM Levie Heritage, DO [facility] None   04/26/2021 10:15 AM Gerrit Heck, CNM [facility] None  05/10/2021  9:55 AM Aviva Signs, CNM [facility] None  05/16/2021 10:45 AM [facility] NURSE [facility]  05/16/2021 11:00 AM [facility] US1 [facility]  05/24/2021  9:55 AM Aviva Signs, CNM [facility] None  06/07/2021  9:55 AM Aviva Signs, CNM [facility] None    Wynelle Bourgeois, CNM

## 2021-03-16 ENCOUNTER — Other Ambulatory Visit: Payer: Self-pay | Admitting: Family Medicine

## 2021-03-16 ENCOUNTER — Inpatient Hospital Stay (HOSPITAL_COMMUNITY)
Admission: AD | Admit: 2021-03-16 | Discharge: 2021-03-16 | Disposition: A | Discharge status: Home / Self Care | Payer: BC Managed Care – PPO | Attending: Obstetrics & Gynecology | Admitting: Obstetrics & Gynecology

## 2021-03-16 ENCOUNTER — Encounter (HOSPITAL_COMMUNITY): Payer: Self-pay | Admitting: Obstetrics & Gynecology

## 2021-03-16 ENCOUNTER — Other Ambulatory Visit: Payer: Self-pay

## 2021-03-16 DIAGNOSIS — Z3480 Encounter for supervision of other normal pregnancy, unspecified trimester: Secondary | ICD-10-CM

## 2021-03-16 DIAGNOSIS — Z7982 Long term (current) use of aspirin: Secondary | ICD-10-CM | POA: Insufficient documentation

## 2021-03-16 DIAGNOSIS — O368120 Decreased fetal movements, second trimester, not applicable or unspecified: Secondary | ICD-10-CM

## 2021-03-16 DIAGNOSIS — O23592 Infection of other part of genital tract in pregnancy, second trimester: Secondary | ICD-10-CM | POA: Diagnosis not present

## 2021-03-16 DIAGNOSIS — O98312 Other infections with a predominantly sexual mode of transmission complicating pregnancy, second trimester: Secondary | ICD-10-CM | POA: Diagnosis not present

## 2021-03-16 DIAGNOSIS — Z3A22 22 weeks gestation of pregnancy: Secondary | ICD-10-CM

## 2021-03-16 DIAGNOSIS — A5901 Trichomonal vulvovaginitis: Secondary | ICD-10-CM

## 2021-03-16 LAB — WET PREP, GENITAL
Sperm: NONE SEEN
WBC, Wet Prep HPF POC: >=10 — AB (ref <10)
Yeast Wet Prep HPF POC: NONE SEEN

## 2021-03-16 MED ORDER — METRONIDAZOLE 500 MG PO TABS
2000.0000 mg | ORAL_TABLET | Freq: Once | ORAL | Status: AC
  Administered 2021-03-16: 2000 mg via ORAL
  Filled 2021-03-16: qty 4

## 2021-03-16 NOTE — Discharge Instructions (Addendum)
Please make sure your partner is treated for trichomonas and avoid sexually activity until you both have been treated and wait >1 week from treatment.   If you have any vaginal bleeding onto your underwear, large leak/gush of fluid, severe belly cramping/pain, or other concern for pregnancy --please be seen.

## 2021-03-16 NOTE — MAU Provider Note (Signed)
History     CSN: [id number]  Arrival date and time: 03/16/21 2004  Chief Complaint  Patient presents with   Decreased Fetal Movement   Vaginal Bleeding   Jodi Roberts is a 31 year old G37P0010 female presenting for evaluation of decreased fetal movement and vaginal spotting.   She started feeling fetal movement about 1.5 weeks ago and usually feels fetal movement a few times daily. Reports she did not feel her move as much as usual today, but states she had today off from work so she was also more busy (usually sitting at a desk for work). She reports + movement when she arrived into the MAU room.   She actually presented to care because she had vaginal spotting around 7pm this evening which worried her. She noticed a small amount of pink tinged discharge when she wiped. She has not had any bleeding on her underwear. She used the restroom while in the MAU and had no further pink tinge. She denies any dysuria, change in vaginal discharge, vaginal itching/irritation, LOF, abdominal pain/cramping, fever, N/V/D. Otherwise has felt well. Denies any abdominal trauma/falls. Recent intercourse about 4 days ago with monogamous female partner. Denies concern for STDs.   She had a recent anatomy US on 02/21/2021 with a posterior placenta and normal anatomy. She sees [facility] for prenatal care.   Past Medical History:  Diagnosis Date   Arthritis    osteoarthritis    Past Surgical History:  Procedure Laterality Date   DILATION AND CURETTAGE OF UTERUS  2016   SAB   KNEE ARTHROSCOPY Right    reconstruction surgery   KNEE CARTILAGE SURGERY Left    KNEE CLOSED REDUCTION Left 03/07/2019   Procedure: CLOSED MANIPULATION LEFT  KNEE;  Surgeon: Leandrew Koyanagi, MD;  Location: [place];  Service: Orthopedics;  Laterality: Left;   LEG SURGERY Right    rods and screws   TOTAL KNEE ARTHROPLASTY Left 12/09/2018   TOTAL KNEE ARTHROPLASTY Left 12/09/2018   Procedure: LEFT TOTAL KNEE ARTHROPLASTY;  Surgeon:  Leandrew Koyanagi, MD;  Location: [place];  Service: Orthopedics;  Laterality: Left;   WISDOM TOOTH EXTRACTION      Family History  Problem Relation Age of Onset   Cancer Neg Hx    Diabetes Neg Hx    Hypertension Neg Hx     Social History   Tobacco Use   Smoking status: Never   Smokeless tobacco: Never  Vaping Use   Vaping Use: Never used  Substance Use Topics   Alcohol use: Never   Drug use: Never    Allergies:  Allergies  Allergen Reactions   Shellfish Allergy Anaphylaxis and Swelling    Throat closes Only with eating.  Ok to touch iodine or betadine.   Sulfa Antibiotics Anaphylaxis    Medications Prior to Admission  Medication Sig Dispense Refill Last Dose   albuterol (PROVENTIL HFA;VENTOLIN HFA) 108 (90 Base) MCG/ACT inhaler Inhale 1-2 puffs into the lungs every 6 (six) hours as needed for wheezing or shortness of breath. (Patient not taking: Reported on 03/15/2021) 1 Inhaler 0    aspirin 81 MG chewable tablet Chew 1 tablet (81 mg total) by mouth daily. After 12 weeks 30 tablet 6    Doxylamine-Pyridoxine (DICLEGIS) 10-10 MG TBEC Take 2 tablets by mouth at bedtime as needed (nausea). 60 tablet 3    Prenat w/o A Vit-FeFum-FePo-FA (FOLIVANE-OB) 85-1 MG CAPS Take 1 capsule by mouth daily. 90 capsule 3    prenatal vitamin w/FE,  FA (NATACHEW) 29-1 MG CHEW chewable tablet Chew 1 tablet by mouth daily at 12 noon. (Patient not taking: Reported on 03/15/2021) 30 tablet 12     Review of Systems  Constitutional:  Negative for chills, fatigue and fever.  Respiratory:  Negative for shortness of breath.   Cardiovascular:  Negative for chest pain.  Gastrointestinal:  Negative for abdominal pain, anal bleeding, blood in stool, constipation, diarrhea, nausea and vomiting.  Genitourinary:  Negative for dysuria, hematuria, pelvic pain and vaginal pain.  Musculoskeletal:  Negative for back pain.  Skin:  Negative for color change.  Neurological:  Negative for dizziness, syncope,  light-headedness, numbness and headaches.  Physical Exam   Blood pressure 118/72, pulse 95, temperature 97.9 F (36.6 C), resp. rate 17, last menstrual period 10/10/2020, SpO2 100 %.  Physical Exam Constitutional:      General: She is not in acute distress.    Appearance: Normal appearance. She is not ill-appearing or diaphoretic.  HENT:     Head: Normocephalic and atraumatic.     Mouth/Throat:     Mouth: Mucous membranes are moist.  Eyes:     Extraocular Movements: Extraocular movements intact.  Cardiovascular:     Rate and Rhythm: Normal rate.  Pulmonary:     Effort: Pulmonary effort is normal.  Abdominal:     General: There is no distension.     Palpations: Abdomen is soft.     Tenderness: There is no abdominal tenderness.  Genitourinary:    Comments: Pelvic exam: VULVA: normal appearing vulva with no masses, tenderness or lesions, VAGINA: normal appearing vagina with normal color and moderate amount of white/yellow discharge, no lesions, CERVIX: normal appearing cervix without lesions. No bleeding seen from cervical os and no pink tinge to discharge seen within canal. Cervix visually closed.  Skin:    General: Skin is warm and dry.     Capillary Refill: Capillary refill takes less than 2 seconds.  Neurological:     Mental Status: She is alert.   Fetal HR: 144 bpm   MAU Course    MDM Given ginger ale to drink and encouraged feeling for fetal movement.  Wet prep performed  Pelvic exam    Reassessed 2215: Reports several fetal movements since being in the room, endorses this feels at her baseline. Discussed + trichomonas and would like to complete treatment prior to discharge. Recommended also collecting gc/ch since +STD which patient agreed in proceeding with (initially had declined), will do self swab.   Assessment and Plan   1. Trichomonal vaginitis during pregnancy in second trimester   Pink-tinged discharge Flagyl 2g given in the MAU. Discussed importance of  partner treatment and abstinence until both completed. Suspected likely etiology of pink tinged discharge, otherwise pelvic exam unremarkable without evidence of bleeding elsewhere. No associated cramping. Provided reassurance and return precautions.   2. [redacted] weeks gestation of pregnancy  3. Decreased fetal movement affecting management of pregnancy in second trimester, single or unspecified fetus Resolved once arrived to MAU with monitoring for movement. +FHT. Set expectations on expected fetal movement at this gestational age.   Discharged home in stable condition. MAU return precautions discussed.     [name] 03/16/2021, 9:24 PM

## 2021-03-16 NOTE — MAU Note (Signed)
RN collected GC/Chlamydia swab by 'blind swab' and pt tol well.

## 2021-03-16 NOTE — Progress Notes (Signed)
Dr Annia Friendly in earlier and discussed test results and d/c plan with pt.

## 2021-03-16 NOTE — Progress Notes (Signed)
No blood seen in vagina or coming from cervix per Dr Higinio Plan. Wet prep collected

## 2021-03-16 NOTE — MAU Note (Addendum)
Pt has not felt baby move as much as usual today. Just started feeling movement about a wk ago and usually feels her couple times daily. Placenta is anterior. Noticed pink spotting tonight is what made her decide to be seen. No recent intercourse. No pain

## 2021-03-16 NOTE — Progress Notes (Signed)
Written and verbal d/c instructions given and understanding voiced. 

## 2021-03-17 ENCOUNTER — Other Ambulatory Visit: Payer: Self-pay

## 2021-03-17 LAB — GC/CHLAMYDIA PROBE AMP (~~LOC~~) NOT AT ARMC
Chlamydia: NEGATIVE
Comment: NEGATIVE
Comment: NORMAL
Neisseria Gonorrhea: NEGATIVE

## 2021-03-17 MED ORDER — METRONIDAZOLE 500 MG PO TABS: 500.0000 mg | ORAL_TABLET | Freq: Every day | ORAL | 0 refills | Status: DC

## 2021-03-17 NOTE — Progress Notes (Signed)
Patient called and states that she was treated for Trich in the maternity assesment unit yesterday. Patient states she went home and had dinner and then got sick. Patient advised we will try and send another script for her to try again. Kathrene Alu RN

## 2021-04-13 ENCOUNTER — Other Ambulatory Visit: Payer: Self-pay

## 2021-04-13 ENCOUNTER — Ambulatory Visit (INDEPENDENT_AMBULATORY_CARE_PROVIDER_SITE_OTHER): Payer: BC Managed Care – PPO | Admitting: Family Medicine

## 2021-04-13 VITALS — BP 134/82 | HR 96 | Wt 250.0 lb

## 2021-04-13 DIAGNOSIS — O24419 Gestational diabetes mellitus in pregnancy, unspecified control: Secondary | ICD-10-CM

## 2021-04-13 DIAGNOSIS — Z3A28 28 weeks gestation of pregnancy: Secondary | ICD-10-CM | POA: Diagnosis not present

## 2021-04-13 DIAGNOSIS — O23592 Infection of other part of genital tract in pregnancy, second trimester: Secondary | ICD-10-CM

## 2021-04-13 DIAGNOSIS — Z23 Encounter for immunization: Secondary | ICD-10-CM | POA: Diagnosis not present

## 2021-04-13 DIAGNOSIS — A5901 Trichomonal vulvovaginitis: Secondary | ICD-10-CM

## 2021-04-13 DIAGNOSIS — Z3480 Encounter for supervision of other normal pregnancy, unspecified trimester: Secondary | ICD-10-CM

## 2021-04-13 NOTE — Progress Notes (Signed)
? ?  PRENATAL VISIT NOTE ? ?Subjective:  ?Jodi Roberts is a 31 y.o. G2P0010 at [redacted]w[redacted]d being seen today for ongoing prenatal care.  She is currently monitored for the following issues for this low-risk pregnancy and has Status post total left knee replacement; Supervision of other normal pregnancy, antepartum; and Trichomonal vaginitis during pregnancy in second trimester on their problem list. ? ?Patient reports no complaints.  Contractions: Not present. Vag. Bleeding: None.  Movement: Present. Denies leaking of fluid.  ? ?The following portions of the patient's history were reviewed and updated as appropriate: allergies, current medications, past family history, past medical history, past social history, past surgical history and problem list.  ? ?Objective:  ? ?Vitals:  ? 04/13/21 0832  ?BP: 134/82  ?Pulse: 96  ?Weight: 250 lb (113.4 kg)  ? ? ?Fetal Status: Fetal Heart Rate (bpm): 145   Movement: Present    ? ?General:  Alert, oriented and cooperative. Patient is in no acute distress.  ?Skin: Skin is warm and dry. No rash noted.   ?Cardiovascular: Normal heart rate noted  ?Respiratory: Normal respiratory effort, no problems with respiration noted  ?Abdomen: Soft, gravid, appropriate for gestational age.  Pain/Pressure: Present     ?Pelvic: Cervical exam deferred        ?Extremities: Normal range of motion.  Edema: None  ?Mental Status: Normal mood and affect. Normal behavior. Normal judgment and thought content.  ? ?Assessment and Plan:  ?Pregnancy: G2P0010 at [redacted]w[redacted]d ?1. [redacted] weeks gestation of pregnancy ?- CBC ?- Glucose Tolerance, 2 Hours w/1 Hour ?- HIV Antibody (routine testing w rflx) ?- RPR ? ?2. Supervision of other normal pregnancy, antepartum ?FHT and FH normal ? ?3. Trichomonal vaginitis during pregnancy in second trimester ?TOC next appt ? ? ?Preterm labor symptoms and general obstetric precautions including but not limited to vaginal bleeding, contractions, leaking of fluid and fetal movement were reviewed  in detail with the patient. ?Please refer to After Visit Summary for other counseling recommendations.  ? ?No follow-ups on file. ? ?Future Appointments  ?Date Time Provider Department Center  ?04/26/2021 10:15 AM Gerrit Heck, CNM [facility] None  ?05/10/2021  9:55 AM Aviva Signs, CNM [facility] None  ?05/16/2021 10:45 AM [facility] NURSE [facility]  ?05/16/2021 11:00 AM [facility] US1 [facility]  ?05/24/2021  9:55 AM Aviva Signs, CNM [facility] None  ?06/07/2021  9:55 AM Aviva Signs, CNM [facility] None  ? ? ?Levie Heritage, DO ?

## 2021-04-14 LAB — HIV ANTIBODY (ROUTINE TESTING W REFLEX): HIV Screen 4th Generation wRfx: NONREACTIVE

## 2021-04-14 LAB — CBC
Hematocrit: 30.6 % — ABNORMAL LOW (ref 34.0–46.6)
Hemoglobin: 10 g/dL — ABNORMAL LOW (ref 11.1–15.9)
MCH: 29.3 pg (ref 26.6–33.0)
MCHC: 32.7 g/dL (ref 31.5–35.7)
MCV: 90 fL (ref 79–97)
Platelets: 264 10*3/uL (ref 150–450)
RBC: 3.41 x10E6/uL — ABNORMAL LOW (ref 3.77–5.28)
RDW: 14.1 % (ref 11.7–15.4)
WBC: 8.3 10*3/uL (ref 3.4–10.8)

## 2021-04-14 LAB — GLUCOSE TOLERANCE, 2 HOURS W/ 1HR
Glucose, 1 hour: 192 mg/dL — ABNORMAL HIGH (ref 70–179)
Glucose, 2 hour: 175 mg/dL — ABNORMAL HIGH (ref 70–152)
Glucose, Fasting: 95 mg/dL — ABNORMAL HIGH (ref 70–91)

## 2021-04-14 LAB — RPR: RPR Ser Ql: NONREACTIVE

## 2021-04-21 DIAGNOSIS — O24419 Gestational diabetes mellitus in pregnancy, unspecified control: Secondary | ICD-10-CM | POA: Insufficient documentation

## 2021-04-21 MED ORDER — ACCU-CHEK GUIDE VI STRP: ORAL_STRIP | 12 refills | Status: DC

## 2021-04-21 MED ORDER — ACCU-CHEK GUIDE W/DEVICE KIT: 1.0000 | PACK | Freq: Every day | 0 refills | Status: DC

## 2021-04-21 MED ORDER — ACCU-CHEK SOFTCLIX LANCETS MISC: 1.0000 | Freq: Four times a day (QID) | 12 refills | Status: DC

## 2021-04-21 NOTE — Addendum Note (Signed)
Addended by: Levie Heritage on: 04/21/2021 01:05 PM ? ? Modules accepted: Orders ? ?

## 2021-04-26 ENCOUNTER — Other Ambulatory Visit: Payer: Self-pay

## 2021-04-26 ENCOUNTER — Other Ambulatory Visit (HOSPITAL_COMMUNITY)
Admission: RE | Admit: 2021-04-26 | Discharge: 2021-04-26 | Disposition: A | Discharge status: Home / Self Care | Payer: BC Managed Care – PPO | Source: Ambulatory Visit

## 2021-04-26 ENCOUNTER — Ambulatory Visit (INDEPENDENT_AMBULATORY_CARE_PROVIDER_SITE_OTHER): Payer: BC Managed Care – PPO

## 2021-04-26 VITALS — BP 119/69 | HR 106 | Wt 257.0 lb

## 2021-04-26 DIAGNOSIS — Z3480 Encounter for supervision of other normal pregnancy, unspecified trimester: Secondary | ICD-10-CM | POA: Diagnosis not present

## 2021-04-26 DIAGNOSIS — O24419 Gestational diabetes mellitus in pregnancy, unspecified control: Secondary | ICD-10-CM

## 2021-04-26 DIAGNOSIS — Z3A28 28 weeks gestation of pregnancy: Secondary | ICD-10-CM

## 2021-04-26 DIAGNOSIS — A5901 Trichomonal vulvovaginitis: Secondary | ICD-10-CM | POA: Insufficient documentation

## 2021-04-26 DIAGNOSIS — O23592 Infection of other part of genital tract in pregnancy, second trimester: Secondary | ICD-10-CM | POA: Insufficient documentation

## 2021-04-26 NOTE — Progress Notes (Signed)
? ?  HIGH-RISK PREGNANCY OFFICE VISIT ? ?Patient name: Jodi Roberts MRN [id number]  Date of birth: [birth year] ?Chief Complaint:   ?Routine Prenatal Visit ? ?Subjective:   ?Jodi Roberts is a 31 y.o. G34P0010 female at [redacted]w[redacted]d with an Estimated Date of Delivery: 07/17/21 being seen today for ongoing management of a high-risk pregnancy aeb has Status post total left knee replacement; Supervision of other normal pregnancy, antepartum; Trichomonal vaginitis during pregnancy in second trimester; and Gestational diabetes mellitus (GDM) in third trimester on their problem list. ? ?Patient presents today,alone, with no complaints.  Patient endorses fetal movement. Patient denies abdominal cramping or contractions.  Patient denies vaginal concerns including abnormal discharge, leaking of fluid, and bleeding.  No issues with urination, constipation, or diarrhea. Contractions: Not present. Vag. Bleeding: None.  Movement: Present. ? ?Reviewed past medical,surgical, social, obstetrical and family history as well as problem list, medications and allergies. ? ?Objective  ? ?Vitals:  ? 04/26/21 1015  ?BP: 119/69  ?Pulse: (!) 106  ?Weight: 257 lb (116.6 kg)  ?Body mass index is 42.77 kg/m?.  ?Total Weight Gain:26 lb (11.8 kg) ? ?  ?     Physical Examination:  ? General appearance: Well appearing, and in no distress ? Mental status: Alert, oriented to person, place, and time ? Skin: Warm & dry ? Cardiovascular: Normal heart rate noted ? Respiratory: Normal respiratory effort, no distress ? Abdomen: Soft, gravid, nontender, AGA with Fundal Height: 30 cm ? Pelvic: Cervical exam deferred          ? Extremities: Edema: None ? ?Fetal Status: Fetal Heart Rate (bpm): 150  Movement: Present  ? ?No results found for this or any previous visit (from the past 24 hour(s)).  ?Assessment & Plan:  ?High-risk pregnancy of a 31 y.o., G2P0010 at [redacted]w[redacted]d with an Estimated Date of Delivery: 07/17/21  ? ? ?1. Supervision of other normal pregnancy,  antepartum ?-Anticipatory guidance for upcoming appts. ?-Patient to schedule next appt in 2 weeks for an in-person visit. ? ?2. [redacted] weeks gestation of pregnancy ?-Doing well. ? ?3. Gestational diabetes mellitus (GDM) in third trimester, gestational diabetes method of control unspecified ?-Reports she has been testing herself. ?-3/11: Fasting 96, Br: 131, Dinner 136 ?-3/12: Br: 101, Dinner 143 ?-3/13: Fasting 99, Br: 145, Dinner 111 ?-3/14 Fasting: 89 ?-Informed that levels need to improve.  Discussed scheduling for diabetic education to promote increased awareness and improve nutritional intake.  ?-Support staff made aware and appt scheduled. ?-Next [place] appt scheduled for April 3rd ? ?4. Trichomonal vaginitis during pregnancy in second trimester ?-Will repeat CV today for TOC ? ? ?  ?Meds: No orders of the defined types were placed in this encounter. ? ?Labs/procedures today:  ?Lab Orders  ?No laboratory test(s) ordered today  ?  ? ?Reviewed: Preterm labor symptoms and general obstetric precautions including but not limited to vaginal bleeding, contractions, leaking of fluid and fetal movement were reviewed in detail with the patient.  All questions were answered. ? ?Follow-up: Return in about 2 weeks (around 05/10/2021). ? ?No orders of the defined types were placed in this encounter. ? ?Maryann Conners MSN, CNM ?04/26/2021 ? ?[place] ?

## 2021-04-27 LAB — CERVICOVAGINAL ANCILLARY ONLY
Comment: NEGATIVE
Trichomonas: NEGATIVE

## 2021-05-05 ENCOUNTER — Other Ambulatory Visit: Payer: BC Managed Care – PPO

## 2021-05-10 ENCOUNTER — Ambulatory Visit (INDEPENDENT_AMBULATORY_CARE_PROVIDER_SITE_OTHER): Payer: BC Managed Care – PPO | Admitting: Advanced Practice Midwife

## 2021-05-10 ENCOUNTER — Encounter: Payer: Self-pay | Admitting: General Practice

## 2021-05-10 ENCOUNTER — Other Ambulatory Visit: Payer: Self-pay

## 2021-05-10 VITALS — BP 124/77 | HR 102 | Wt 263.0 lb

## 2021-05-10 DIAGNOSIS — Z3480 Encounter for supervision of other normal pregnancy, unspecified trimester: Secondary | ICD-10-CM

## 2021-05-10 DIAGNOSIS — O24419 Gestational diabetes mellitus in pregnancy, unspecified control: Secondary | ICD-10-CM

## 2021-05-10 DIAGNOSIS — Z3A30 30 weeks gestation of pregnancy: Secondary | ICD-10-CM

## 2021-05-10 MED ORDER — METFORMIN HCL 500 MG PO TABS: 500.0000 mg | ORAL_TABLET | Freq: Two times a day (BID) | ORAL | 3 refills | Status: DC

## 2021-05-10 NOTE — Progress Notes (Signed)
? ?  PRENATAL VISIT NOTE ? ?Subjective:  ?Jodi Roberts is a 31 y.o. G2P0010 at [redacted]w[redacted]d being seen today for ongoing prenatal care.  She is currently monitored for the following issues for this high-risk pregnancy and has Status post total left knee replacement; Supervision of other normal pregnancy, antepartum; and Gestational diabetes mellitus (GDM) in third trimester on their problem list. ? ?Patient reports no complaints and Wants a "sit to stand" note for work.  Missed diabetes class due to work  Wants to reschedule .  Contractions: Not present. Vag. Bleeding: Small.  Movement: Present. Denies leaking of fluid.  ? ?The following portions of the patient's history were reviewed and updated as appropriate: allergies, current medications, past family history, past medical history, past social history, past surgical history and problem list.  ? ?Fastings 88-113 ?PC glucose levels 115-189. ?Doing diet intermittently  ? ?Objective:  ? ?Vitals:  ? 05/10/21 0958  ?BP: 124/77  ?Pulse: (!) 102  ?Weight: 263 lb (119.3 kg)  ? ? ?Fetal Status: Fetal Heart Rate (bpm): 153   Movement: Present    ? ?General:  Alert, oriented and cooperative. Patient is in no acute distress.  ?Skin: Skin is warm and dry. No rash noted.   ?Cardiovascular: Normal heart rate noted  ?Respiratory: Normal respiratory effort, no problems with respiration noted  ?Abdomen: Soft, gravid, appropriate for gestational age.  Pain/Pressure: Present     ?Pelvic: Cervical exam deferred        ?Extremities: Normal range of motion.  Edema: None  ?Mental Status: Normal mood and affect. Normal behavior. Normal judgment and thought content.  ? ?Assessment and Plan:  ?Pregnancy: G2P0010 at [redacted]w[redacted]d ?1. [redacted] weeks gestation of pregnancy ?    [place] scheduled ? ?2. Supervision of other normal pregnancy, antepartum ?    Work note given ? ?3. Gestational diabetes mellitus (GDM) in third trimester, gestational diabetes method of control unspecified ?    Reschedule nutrition class ?     Will start Metformin 500mg  bid to aid in control  ?    Will change one appt to MD appt for followup  ? ?Preterm labor symptoms and general obstetric precautions including but not limited to vaginal bleeding, contractions, leaking of fluid and fetal movement were reviewed in detail with the patient. ?Please refer to After Visit Summary for other counseling recommendations.  ? ?Return in about 2 weeks (around 05/24/2021), or next appt with MD    Start twice weekly testing for diabetes (NST/AFI), for [facility]. ? ?Future Appointments  ?Date Time Provider [place]  ?05/16/2021 10:45 AM [facility] NURSE [facility]  ?05/16/2021 11:00 AM [facility] US1 [facility]  ?05/26/2021  8:15 AM Anyanwu, Sallyanne Havers, MD [facility] None  ?05/26/2021 10:15 AM [facility]-ED[facility]ATION [facility]  ?06/07/2021  9:55 AM Seabron Spates, CNM [facility] None  ?06/21/2021  9:55 AM Seabron Spates, CNM [facility] None  ?06/28/2021 10:55 AM Seabron Spates, CNM [facility] None  ?07/05/2021 10:35 AM Seabron Spates, CNM [facility] None  ?07/12/2021  9:55 AM Seabron Spates, CNM [facility] None  ?07/19/2021  9:55 AM Gavin Pound, CNM [facility] None  ? ? ?Hansel Feinstein, CNM ?

## 2021-05-16 ENCOUNTER — Ambulatory Visit: Payer: BC Managed Care – PPO | Attending: Obstetrics and Gynecology | Admitting: Obstetrics and Gynecology

## 2021-05-16 ENCOUNTER — Other Ambulatory Visit: Payer: Self-pay | Admitting: Obstetrics and Gynecology

## 2021-05-16 ENCOUNTER — Encounter: Payer: Self-pay | Admitting: *Deleted

## 2021-05-16 ENCOUNTER — Ambulatory Visit: Payer: BC Managed Care – PPO | Attending: Obstetrics and Gynecology

## 2021-05-16 ENCOUNTER — Ambulatory Visit: Payer: BC Managed Care – PPO | Admitting: *Deleted

## 2021-05-16 VITALS — BP 129/76 | HR 105

## 2021-05-16 DIAGNOSIS — Z3A31 31 weeks gestation of pregnancy: Secondary | ICD-10-CM

## 2021-05-16 DIAGNOSIS — O24419 Gestational diabetes mellitus in pregnancy, unspecified control: Secondary | ICD-10-CM

## 2021-05-16 DIAGNOSIS — Z3480 Encounter for supervision of other normal pregnancy, unspecified trimester: Secondary | ICD-10-CM

## 2021-05-16 DIAGNOSIS — Z362 Encounter for other antenatal screening follow-up: Secondary | ICD-10-CM | POA: Diagnosis not present

## 2021-05-16 DIAGNOSIS — O99213 Obesity complicating pregnancy, third trimester: Secondary | ICD-10-CM

## 2021-05-16 DIAGNOSIS — E669 Obesity, unspecified: Secondary | ICD-10-CM

## 2021-05-16 DIAGNOSIS — Z6836 Body mass index (BMI) 36.0-36.9, adult: Secondary | ICD-10-CM | POA: Insufficient documentation

## 2021-05-16 DIAGNOSIS — O24415 Gestational diabetes mellitus in pregnancy, controlled by oral hypoglycemic drugs: Secondary | ICD-10-CM

## 2021-05-16 NOTE — Progress Notes (Signed)
Maternal-Fetal Medicine  ? ?Name: Jodi Roberts ?DOB: [birth year] ?MRN: [id number] ?Referring Provider: Wynelle Bourgeois, CNM ? ?I had the pleasure of seeing [facility] at [facility]. She is G2 P0010 at 31w 1d gestation and is here for fetal growth assessment. ?She has a new diagnosis of gestational diabetes.  She will be meeting with the diabetic educator next week.  Patient is started checking her blood glucose and reports her fasting levels are better controlled and ranges between 93 and 96 mg/DL.  She is unable to check her postprandial levels regularly in the range between 130 and 150 mg/DL.  She takes metformin 500 mg twice daily. ?She does not have hypertension.  Her pregnancy is well dated by LMP consistent with first trimester ultrasound. ? ?Ultrasound ?On today's ultrasound, the estimated fetal weight is at the 99 percentile and the abdominal circumference measurement is at the 99th percentile.  Amniotic fluid is normal and good fetal activity seen. ? ?Gestational diabetes ?I explained the diagnosis of gestational diabetes.  I emphasized the importance of good blood glucose control to prevent adverse fetal or neonatal outcomes.  I discussed blood glucose normal values. I encouraged her to check her blood glucose regularly. ?Possible complications of gestational diabetes include fetal macrosomia, shoulder dystocia and birth injuries, stillbirth (in poorly controlled diabetes) and neonatal respiratory syndrome and other complications. ?Patient has just started taking metformin. I encouraged her to meet with our diabetic educator next week. If diabetes is not well controlled on metformin, insulin may be initiated. ? ?Exercise reduces the need for insulin.  I encouraged her to exercise regularly ? ?Timing of delivery: In well-controlled diabetes on diet, patient can be delivered at 39 weeks' gestation. Vaginal delivery is not contraindicated. ?Type 2 diabetes develops in up to 50% of  women with GDM. I recommend postpartum screening with 75-g glucose load at 6 to 12 weeks after delivery. ? ?Recommendations ?-Weekly BPP from next week till delivery. ?-We will discuss timing and mode of delivery after next fetal growth assessment. ?-Postpartum screening for type 2 diabetes. ?Thank you for consultation.  If you have any questions or concerns, please contact me the [facility].  Consultation including face-to-face (more than 50%) counseling 30 minutes. ? ?

## 2021-05-18 ENCOUNTER — Encounter: Payer: Self-pay | Admitting: General Practice

## 2021-05-24 ENCOUNTER — Encounter: Payer: BC Managed Care – PPO | Admitting: Advanced Practice Midwife

## 2021-05-25 ENCOUNTER — Ambulatory Visit: Payer: BC Managed Care – PPO | Attending: Obstetrics and Gynecology | Admitting: *Deleted

## 2021-05-25 ENCOUNTER — Ambulatory Visit: Payer: BC Managed Care – PPO | Admitting: *Deleted

## 2021-05-25 VITALS — BP 125/76 | HR 101

## 2021-05-25 DIAGNOSIS — E669 Obesity, unspecified: Secondary | ICD-10-CM | POA: Insufficient documentation

## 2021-05-25 DIAGNOSIS — Z3A32 32 weeks gestation of pregnancy: Secondary | ICD-10-CM | POA: Insufficient documentation

## 2021-05-25 DIAGNOSIS — O24415 Gestational diabetes mellitus in pregnancy, controlled by oral hypoglycemic drugs: Secondary | ICD-10-CM | POA: Diagnosis not present

## 2021-05-25 DIAGNOSIS — O99213 Obesity complicating pregnancy, third trimester: Secondary | ICD-10-CM | POA: Insufficient documentation

## 2021-05-25 DIAGNOSIS — O24419 Gestational diabetes mellitus in pregnancy, unspecified control: Secondary | ICD-10-CM | POA: Diagnosis not present

## 2021-05-25 NOTE — Procedures (Signed)
Jodi Roberts ?[birth year] ?[redacted]w[redacted]d ? ?Fetus A Non-Stress Test Interpretation for 05/25/21 ? ?Indication: Gestational Diabetes medication controlled ? ?Fetal Heart Rate A ?Mode: External ?Baseline Rate (A): 150 bpm ?Variability: Moderate ?Accelerations: 15 x 15 ?Decelerations: None ?Multiple birth?: No ? ?Uterine Activity ?Mode: Toco ?Contraction Frequency (min): none ?Resting Tone Palpated: Relaxed ? ?Interpretation (Fetal Testing) ?Nonstress Test Interpretation: Reactive ?Overall Impression: Reassuring for gestational age ?Comments: tracing reviewed by Dr. Parke Poisson ? ? ?

## 2021-05-26 ENCOUNTER — Ambulatory Visit (INDEPENDENT_AMBULATORY_CARE_PROVIDER_SITE_OTHER): Payer: BC Managed Care – PPO | Admitting: Registered"

## 2021-05-26 ENCOUNTER — Encounter: Payer: Self-pay | Admitting: Obstetrics & Gynecology

## 2021-05-26 ENCOUNTER — Encounter: Payer: BC Managed Care – PPO | Attending: Family Medicine | Admitting: Registered"

## 2021-05-26 ENCOUNTER — Ambulatory Visit (INDEPENDENT_AMBULATORY_CARE_PROVIDER_SITE_OTHER): Payer: BC Managed Care – PPO | Admitting: Obstetrics & Gynecology

## 2021-05-26 VITALS — BP 113/70 | HR 101 | Wt 265.0 lb

## 2021-05-26 DIAGNOSIS — O3663X0 Maternal care for excessive fetal growth, third trimester, not applicable or unspecified: Secondary | ICD-10-CM

## 2021-05-26 DIAGNOSIS — O24419 Gestational diabetes mellitus in pregnancy, unspecified control: Secondary | ICD-10-CM | POA: Insufficient documentation

## 2021-05-26 DIAGNOSIS — Z713 Dietary counseling and surveillance: Secondary | ICD-10-CM | POA: Diagnosis not present

## 2021-05-26 DIAGNOSIS — Z3A00 Weeks of gestation of pregnancy not specified: Secondary | ICD-10-CM | POA: Insufficient documentation

## 2021-05-26 DIAGNOSIS — O99210 Obesity complicating pregnancy, unspecified trimester: Secondary | ICD-10-CM | POA: Insufficient documentation

## 2021-05-26 DIAGNOSIS — O24415 Gestational diabetes mellitus in pregnancy, controlled by oral hypoglycemic drugs: Secondary | ICD-10-CM

## 2021-05-26 DIAGNOSIS — Z3A32 32 weeks gestation of pregnancy: Secondary | ICD-10-CM

## 2021-05-26 DIAGNOSIS — O0993 Supervision of high risk pregnancy, unspecified, third trimester: Secondary | ICD-10-CM

## 2021-05-26 MED ORDER — METFORMIN HCL 1000 MG PO TABS: 1000.0000 mg | ORAL_TABLET | Freq: Two times a day (BID) | ORAL | 3 refills | Status: DC

## 2021-05-26 NOTE — Progress Notes (Signed)
? ?PRENATAL VISIT NOTE ? ?Subjective:  ?Jodi Roberts is a 31 y.o. G2P0010 at 109w4d being seen today for ongoing prenatal care.  She is currently monitored for the following issues for this high-risk pregnancy and has Status post total left knee replacement; Supervision of high-risk pregnancy, third trimester; Gestational diabetes mellitus (GDM) in third trimester; Macrosomia of fetus affecting management of mother, antepartum, third trimester; and Obesity in pregnancy, antepartum on their problem list. ? ?Patient reports no complaints.  Contractions: Not present. Vag. Bleeding: None.  Movement: Present. Denies leaking of fluid.  ? ?The following portions of the patient's history were reviewed and updated as appropriate: allergies, current medications, past family history, past medical history, past social history, past surgical history and problem list.  ? ?Objective:  ? ?Vitals:  ? 05/26/21 0812  ?BP: 113/70  ?Pulse: (!) 101  ?Weight: 265 lb (120.2 kg)  ? ? ?Fetal Status: Fetal Heart Rate (bpm): 140   Movement: Present    ? ?General:  Alert, oriented and cooperative. Patient is in no acute distress.  ?Skin: Skin is warm and dry. No rash noted.   ?Cardiovascular: Normal heart rate noted  ?Respiratory: Normal respiratory effort, no problems with respiration noted  ?Abdomen: Soft, gravid, appropriate for gestational age.  Pain/Pressure: Absent     ?Pelvic: Cervical exam deferred        ?Extremities: Normal range of motion.  Edema: None  ?Mental Status: Normal mood and affect. Normal behavior. Normal judgment and thought content.  ? ?Imaging: ?[place] MFM OB FOLLOW UP ? ?Result Date: 05/16/2021 ?----------------------------------------------------------------------  OBSTETRICS REPORT                       (Signed Final 05/16/2021 12:54 pm) ---------------------------------------------------------------------- Patient Info  ID #:       [id number]                          D.O.B.:  02-06-1991 (30 yrs)  Name:       Jodi Roberts                     Visit Date: 05/16/2021 11:18 am ---------------------------------------------------------------------- Performed By  Attending:        Tama High MD        Ref. Address:     [place]  Performed By:     Georgie Chard        [facility]  Referred By:      [facility] [place] ---------------------------------------------------------------------- Orders  #  Description                           Code        Ordered By  1  [place] MFM OB FOLLOW UP                   [phone]    Tama High ----------------------------------------------------------------------  #  Order #                     Accession #                Episode #  1  [id number]                   [id number]                 [id number] ---------------------------------------------------------------------- Indications  Gestational diabetes in pregnancy,             O24.419  unspecified control  Obesity complicating pregnancy, third          O99.213  trimester  [redacted] weeks gestation of pregnancy                Z3A.31  Encounter for other antenatal screening        Z36.2  follow-up  LR NIPS ---------------------------------------------------------------------- Fetal Evaluation  Num Of Fetuses:         1  Fetal Heart Rate(bpm):  150  Cardiac Activity:       Observed  Presentation:           Cephalic  Placenta:               Posterior  P. Cord Insertion:      Previously Visualized  Amniotic Fluid  AFI FV:      Within normal limits  AFI Sum(cm)     %Tile       Largest Pocket(cm)  17.89           67          5.8  RUQ(cm)       RLQ(cm)       LUQ(cm)        LLQ(cm)  3.92          3.98          5.8            4.19  ---------------------------------------------------------------------- Biometry  BPD:      83.9  mm     G. Age:  33w 5d         97  %    CI:        76.91   %    70 - 86  FL/HC:      21.5   %    19.3 - 21.3  HC:       303   mm     G. Age:  33w 5d         81  %    HC/AC:      1.01        0.96 - 1.17  AC:      300.7  mm     G. Age:  34w 0d         99  %    FL/BPD:     77.7   %    71 - 87  FL:       65.2  mm     G. Age:  33w 4d         92  %    FL/AC:      21.7   %    20 - 24  Est. FW:    2291  gm      5 lb 1 oz     99  % ---------------------------------------------------------------------- OB History  Blood Type:   O+  Maternal Racial/Ethnic Group:   Black (non-Hispanic)  Gravidity:    2         Term:   0         SAB:   1  Living:       0 ---------------------------------------------------------------------- Gestational Age  LMP:           31w 1d        Date:  10/10/20                  EDD:   07/17/21  U/S Today:     33w 5d                                        EDD:   06/29/21  Best:          31w 1d     Det. By:  LMP  (10/10/20)          EDD:   07/17/21 ---------------------------------------------------------------------- Anatomy  Cranium:               Appears normal         LVOT:                   Previously seen  Cavum:                 Previously seen        Aortic Arch:            Previously seen  Ventricles:            Appears normal         Ductal Arch:            Previously seen  Choroid Plexus:        Previously seen        Diaphragm:              Appears normal  Cerebellum:            Previously seen        Stomach:                Appears normal, left  sided  Posterior Fossa:       Previously seen        Abdomen:                Previously seen  Nuchal Fold:           Not applicable ([id number]    Abdominal Wall:         Previously seen                         wks GA)  Face:                   Orbits and profile     Cord Vessels:           Previously seen                         previously seen  Lips:                  Previously seen        Kidneys:                Appear normal  Palate:                Not well visualized    Bladder:                Appears normal  Thoracic:              Appears normal         Spine:                  Previously seen                         Previously seen  Heart:                 Previously seen        Upper Extremities:      Previously seen  RVOT:                  Previously seen        Lower Extremities:      Previously seen  Other:  Fetus appears to be female. Heels/feet and open hands/5th digits          previously visualized. Nasal bone previously visualized. Lenses          visualized. Technically difficult due to maternal habitus and fetal          position. ---------------------------------------------------------------------- Impression  On today's ultrasound, the estimated fetal weight is at the 99  percentile and the abdominal circumference measurement is  at the 99th percentile.  Amniotic fluid is normal and good fetal  activity seen.  xxxxxxxxxxxxxxxxxxxxxxxxxxxxxxxxxxxxxxx  Consultation (see EPIC )  I had the pleasure of seeing Jodi [facility] the [facility]. She is G2 P0010 at [place] 1d gestation  and is here for fetal growth assessment.  She has a new diagnosis of gestational diabetes.  She will be  meeting with the diabetic educator next week.  Patient is  started checking her blood glucose and reports her fasting  levels are better controlled and ranges between 93 and 96  mg/DL.  She is unable to check her postprandial levels  regularly in the range between 130 and 150 mg/DL.  She  takes metformin 500  mg twice daily.  She does not have hypertension.  Her pregnancy is well  dated by LMP consistent with first trimester ultrasound.  Gestational diabetes  I explained the diagnosis of gestational diabetes.  I  emphasized the importance of  good blood glucose control to  prevent adverse fetal or neonatal outcomes.  I discussed  blood glucose normal values. I encouraged her to check her  blood glucose regularly.  Possible complications of gestational diabetes include fetal  macrosomia, shou

## 2021-05-26 NOTE — Patient Instructions (Signed)
Return to office for any scheduled appointments. Call the office or go to the MAU at [facility] [place] if: You begin to have strong, frequent contractions Your water breaks.  Sometimes it is a big gush of fluid, sometimes it is just a trickle that keeps getting your underwear wet or running down your legs You have vaginal bleeding.  It is normal to have a small amount of spotting if your cervix was checked.  You do not feel your baby moving like normal.  If you do not, get something to eat and drink and lay down and focus on feeling your baby move.   If your baby is still not moving like normal, you should call the office or go to MAU. Any other obstetric concerns.  

## 2021-05-26 NOTE — Progress Notes (Deleted)
? ?Established Patient Office Visit ? ?Subjective:  ?Patient ID: Jodi Roberts, female    DOB: 12/24/1990  Age: 31 y.o. MRN: [id number] ? ?CC:  ?Chief Complaint  ?Patient presents with  ? Routine Prenatal Visit  ? ? ?HPI ?Ronald Forget presents for *** ? ?Past Medical History:  ?Diagnosis Date  ? Arthritis   ? osteoarthritis  ? ? ?Past Surgical History:  ?Procedure Laterality Date  ? DILATION AND CURETTAGE OF UTERUS  2016  ? SAB  ? KNEE ARTHROSCOPY Right   ? reconstruction surgery  ? KNEE CARTILAGE SURGERY Left   ? KNEE CLOSED REDUCTION Left 03/07/2019  ? Procedure: CLOSED MANIPULATION LEFT  KNEE;  Surgeon: Xu, Naiping M, MD;  Location: [facility];  Service: Orthopedics;  Laterality: Left;  ? LEG SURGERY Right   ? rods and screws  ? TOTAL KNEE ARTHROPLASTY Left 12/09/2018  ? TOTAL KNEE ARTHROPLASTY Left 12/09/2018  ? Procedure: LEFT TOTAL KNEE ARTHROPLASTY;  Surgeon: Xu, Naiping M, MD;  Location: [facility];  Service: Orthopedics;  Laterality: Left;  ? WISDOM TOOTH EXTRACTION    ? ? ?Family History  ?Problem Relation Age of Onset  ? Cancer Neg Hx   ? Diabetes Neg Hx   ? Hypertension Neg Hx   ? ? ?Social History  ? ?Socioeconomic History  ? Marital status: Single  ?  Spouse name: Not on file  ? Number of children: 0  ? Years of education: Not on file  ? Highest education level: High school graduate  ?Occupational History  ? Not on file  ?Tobacco Use  ? Smoking status: Never  ? Smokeless tobacco: Never  ?Vaping Use  ? Vaping Use: Never used  ?Substance and Sexual Activity  ? Alcohol use: Never  ? Drug use: Never  ? Sexual activity: Yes  ?  Birth control/protection: Condom  ?Other Topics Concern  ? Not on file  ?Social History Narrative  ? Not on file  ? ?Social Determinants of Health  ? ?Financial Resource Strain: Not on file  ?Food Insecurity: Not on file  ?Transportation Needs: Not on file  ?Physical Activity: Not on file  ?Stress: Not on file  ?Social Connections: Not on file  ?Intimate Partner Violence: Not  on file  ? ? ?Outpatient Medications Prior to Visit  ?Medication Sig Dispense Refill  ? aspirin 81 MG chewable tablet Chew 1 tablet (81 mg total) by mouth daily. After 12 weeks 30 tablet 6  ? metFORMIN (GLUCOPHAGE) 500 MG tablet Take 1 tablet (500 mg total) by mouth 2 (two) times daily with a meal. 60 tablet 3  ? Prenat w/o A Vit-FeFum-FePo-FA (FOLIVANE-OB) 85-1 MG CAPS Take 1 capsule by mouth daily. 90 capsule 3  ? Accu-Chek Softclix Lancets lancets 1 each by Other route 4 (four) times daily. Use as instructed 100 each 12  ? albuterol (PROVENTIL HFA;VENTOLIN HFA) 108 (90 Base) MCG/ACT inhaler Inhale 1-2 puffs into the lungs every 6 (six) hours as needed for wheezing or shortness of breath. (Patient not taking: Reported on 04/13/2021) 1 Inhaler 0  ? Blood Glucose Monitoring Suppl (ACCU-CHEK GUIDE) w/Device KIT 1 kit by Does not apply route daily. 1 kit 0  ? Doxylamine-Pyridoxine (DICLEGIS) 10-10 MG TBEC Take 2 tablets by mouth at bedtime as needed (nausea). (Patient not taking: Reported on 04/26/2021) 60 tablet 3  ? glucose blood (ACCU-CHEK GUIDE) test strip Use as instructed 100 each 12  ? prenatal vitamin w/FE, FA (NATACHEW) 29-1 MG CHEW chewable tablet Chew 1 tablet   by mouth daily at 12 noon. 30 tablet 12  ? ?No facility-administered medications prior to visit.  ? ? ?Allergies  ?Allergen Reactions  ? Shellfish Allergy Anaphylaxis and Swelling  ?  Throat closes Only with eating.  Ok to touch iodine or betadine.  ? Sulfa Antibiotics Anaphylaxis  ? ? ?ROS ?Review of Systems ? ?  ?Objective:  ?  ?Physical Exam ? ?BP 113/70   Pulse (!) 101   Wt 265 lb (120.2 kg)   LMP 10/10/2020   BMI 44.10 kg/m?  ?Wt Readings from Last 3 Encounters:  ?05/26/21 265 lb (120.2 kg)  ?05/10/21 263 lb (119.3 kg)  ?04/26/21 257 lb (116.6 kg)  ? ? ? ?Health Maintenance Due  ?Topic Date Due  ? COVID-19 Vaccine (1) Never done  ? ? ?There are no preventive care reminders to display for this patient. ? ?No results found for: TSH ?Lab Results   ?Component Value Date  ? WBC 8.3 04/13/2021  ? HGB 10.0 (L) 04/13/2021  ? HCT 30.6 (L) 04/13/2021  ? MCV 90 04/13/2021  ? PLT 264 04/13/2021  ? ?Lab Results  ?Component Value Date  ? NA 138 12/10/2018  ? K 3.9 12/10/2018  ? CO2 23 12/10/2018  ? GLUCOSE 146 (H) 12/10/2018  ? BUN 13 12/10/2018  ? CREATININE 0.73 12/10/2018  ? BILITOT 0.4 12/03/2018  ? ALKPHOS 73 12/03/2018  ? AST 20 12/03/2018  ? ALT 18 12/03/2018  ? PROT 8.4 (H) 12/03/2018  ? ALBUMIN 3.7 12/03/2018  ? CALCIUM 8.4 (L) 12/10/2018  ? ANIONGAP 8 12/10/2018  ? ?No results found for: CHOL ?No results found for: HDL ?No results found for: [place] ?No results found for: TRIG ?No results found for: CHOLHDL ?Lab Results  ?Component Value Date  ? HGBA1C 5.4 12/21/2020  ? ? ?  ?Assessment & Plan:  ? ?Problem List Items Addressed This Visit   ? ?  ? Endocrine  ? Gestational diabetes mellitus (GDM) in third trimester - Primary  ?  ? Other  ? Supervision of high-risk pregnancy, third trimester  ? Macrosomia of fetus affecting management of mother, antepartum, third trimester  ? ?Other Visit Diagnoses   ? ? [redacted] weeks gestation of pregnancy      ? ?  ? ? ?No orders of the defined types were placed in this encounter. ? ? ?Follow-up: Return in about 2 weeks (around 06/09/2021) for OFFICE OB VISIT (MD only).  ? ? ?Penny Pia, RN ?

## 2021-05-26 NOTE — Progress Notes (Signed)
Patient was seen for Gestational Diabetes self-management on 05/26/21  ?Start time 1015 and End time 1115  ? ?Estimated due date: 07/17/21; [redacted]w[redacted]d ? ?Clinical: ?Medications: Metformin 1000 mg bid (was 500 mg bid, dose increased at 4/13 visit) ?Medical History: reviewed  ?Labs: OGTT 95, 192, 175, A1c 5.4%  ? ?Dietary and Lifestyle History: ?Pt states this is her first GDM education visit. Pt reports she is following MD advise and cut back on bread and potatoes and doesn't eat pasta. Pt has stopped drinking Pepsi due to caffeine, however is still drinking sweet tea and cream soda. Pt states she has not discussed her beverage intake with Dr. Adrian Blackwater or Dr. Macon Large. ? ?Per Dr Macon Large notes re: SMBG and medication: ?Fasting 97-120s, PP 130-150s on Metformin 500 mg po bid.  Will max out Metformin to 1000 mg bid ? ?Pt states she has some mild diarrhea side effects from Metformin 500 mg bid. Because she has been drinking soda daily and eats dinner late at night, changes in these 2 behaviors in my opinion that may be enough to be control BG without increasing metformin or adding insulin. ? 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 ?24 hr Recall:  ?First Meal: bacon, eggs, 1 slice of bread ?Snack: yogurt or fruit cup ?Second meal: cobb salad OR spicy chicken sandwich and baked potato, sweet tea ?Snack:none ?Third meal: at home might cook frozen fries and fish sticks ?Snack:none ?Beverages: a lot of water at work, milk, sweet tea, cream soda ? ?NUTRITION INTERVENTION  ?Nutrition education (E-1) on the following topics:  ? ?Initial Follow-up ? ?[x]  []  Definition of Gestational Diabetes ?[x]  []  Why dietary management is important in controlling blood glucose ?[x]  []  Effects each nutrient has on blood glucose levels ?[x]  []  Simple carbohydrates vs complex carbohydrates ?[x]  []  Fluid intake ?[x]  []  Creating a balanced meal plan ?[x]  []  Carbohydrate counting  ?[x]  []  When to check blood glucose  levels ?[x]  []  Proper blood glucose monitoring techniques ?[x]  []  Effect of stress and stress reduction techniques  ?[x]  []  Exercise effect on blood glucose levels, appropriate exercise during pregnancy ?[x]  []  Importance of limiting caffeine and abstaining from alcohol and smoking ?[x]  []  Medications used for blood sugar control during pregnancy ?[x]  []  Hypoglycemia and rule of 15 ?[x]  []  Postpartum self care ? ?Patient already has a meter, is testing pre breakfast and 2 hours after each meal. ? ?Patient instructed to monitor glucose levels: ?FBS: 60 - ? 95 mg/dL (some clinics use 90 for cutoff) ?1 hour: ? 140 mg/dL ?2 hour: ? mg/dL ? ?Patient received handouts: ?Nutrition Diabetes and Pregnancy ?Carbohydrate Counting List ? ?Patient will be seen for follow-up as needed.  ?

## 2021-06-01 ENCOUNTER — Ambulatory Visit: Payer: BC Managed Care – PPO | Admitting: *Deleted

## 2021-06-01 ENCOUNTER — Ambulatory Visit: Payer: BC Managed Care – PPO | Attending: Obstetrics and Gynecology

## 2021-06-01 ENCOUNTER — Encounter: Payer: Self-pay | Admitting: *Deleted

## 2021-06-01 VITALS — BP 128/83 | HR 98

## 2021-06-01 DIAGNOSIS — O3663X0 Maternal care for excessive fetal growth, third trimester, not applicable or unspecified: Secondary | ICD-10-CM | POA: Diagnosis not present

## 2021-06-01 DIAGNOSIS — O99213 Obesity complicating pregnancy, third trimester: Secondary | ICD-10-CM | POA: Diagnosis not present

## 2021-06-01 DIAGNOSIS — O0993 Supervision of high risk pregnancy, unspecified, third trimester: Secondary | ICD-10-CM | POA: Insufficient documentation

## 2021-06-01 DIAGNOSIS — O24415 Gestational diabetes mellitus in pregnancy, controlled by oral hypoglycemic drugs: Secondary | ICD-10-CM

## 2021-06-01 DIAGNOSIS — Z3A33 33 weeks gestation of pregnancy: Secondary | ICD-10-CM | POA: Diagnosis not present

## 2021-06-03 ENCOUNTER — Ambulatory Visit (INDEPENDENT_AMBULATORY_CARE_PROVIDER_SITE_OTHER): Payer: BC Managed Care – PPO | Admitting: Family Medicine

## 2021-06-03 VITALS — BP 119/70 | HR 106 | Wt 268.0 lb

## 2021-06-03 DIAGNOSIS — O24415 Gestational diabetes mellitus in pregnancy, controlled by oral hypoglycemic drugs: Secondary | ICD-10-CM

## 2021-06-03 DIAGNOSIS — O0993 Supervision of high risk pregnancy, unspecified, third trimester: Secondary | ICD-10-CM

## 2021-06-03 DIAGNOSIS — O99210 Obesity complicating pregnancy, unspecified trimester: Secondary | ICD-10-CM

## 2021-06-03 DIAGNOSIS — O3663X0 Maternal care for excessive fetal growth, third trimester, not applicable or unspecified: Secondary | ICD-10-CM

## 2021-06-03 NOTE — Progress Notes (Signed)
? ?  PRENATAL VISIT NOTE ? ?Subjective:  ?Jodi Roberts is a 31 y.o. G2P0010 at [redacted]w[redacted]d being seen today for ongoing prenatal care.  She is currently monitored for the following issues for this high-risk pregnancy and has Status post total left knee replacement; Supervision of high-risk pregnancy, third trimester; Gestational diabetes mellitus (GDM) in third trimester; Macrosomia of fetus affecting management of mother, antepartum, third trimester; and Obesity in pregnancy, antepartum on their problem list. ? ?Patient reports no complaints.  Contractions: Not present. Vag. Bleeding: None.  Movement: Present. Denies leaking of fluid.  ? ?The following portions of the patient's history were reviewed and updated as appropriate: allergies, current medications, past family history, past medical history, past social history, past surgical history and problem list.  ? ?Objective:  ? ?Vitals:  ? 06/03/21 0924  ?BP: 119/70  ?Pulse: (!) 106  ?Weight: 268 lb (121.6 kg)  ? ? ?Fetal Status: Fetal Heart Rate (bpm): 154   Movement: Present    ? ?General:  Alert, oriented and cooperative. Patient is in no acute distress.  ?Skin: Skin is warm and dry. No rash noted.   ?Cardiovascular: Normal heart rate noted  ?Respiratory: Normal respiratory effort, no problems with respiration noted  ?Abdomen: Soft, gravid, appropriate for gestational age.  Pain/Pressure: Absent     ?Pelvic: Cervical exam deferred        ?Extremities: Normal range of motion.     ?Mental Status: Normal mood and affect. Normal behavior. Normal judgment and thought content.  ? ?Assessment and Plan:  ?Pregnancy: G2P0010 at [redacted]w[redacted]d ?1. Supervision of high-risk pregnancy, third trimester ? ?2. Gestational diabetes mellitus (GDM) in third trimester controlled on oral hypoglycemic drug ?Did not bring logbook ?Reports CBGS controlled ?On metformin 1000mg  BID ?Mild diarrhea ?Cut out sweet tea and soda and saw dramatic improvement of numbers ?Discussed delivery at 39  weeks ?Continue antenatal testing ? ?3. Obesity in pregnancy, antepartum ? ?4. Macrosomia of fetus affecting management of mother, antepartum, third trimester ? ? ?Preterm labor symptoms and general obstetric precautions including but not limited to vaginal bleeding, contractions, leaking of fluid and fetal movement were reviewed in detail with the patient. ?Please refer to After Visit Summary for other counseling recommendations.  ? ?No follow-ups on file. ? ?Future Appointments  ?Date Time Provider [place]  ?06/08/2021 10:30 AM [facility] NURSE [facility]  ?06/08/2021 10:45 AM [facility] US6 [facility]US [facility]MC  ?06/09/2021  8:55 AM Nehemiah Settle, Tanna Savoy, DO [facility] None  ?06/15/2021 10:30 AM [facility] NURSE [facility]MC  ?06/15/2021 10:45 AM [facility] US6 [facility] WMC  ?06/23/2021 10:35 AM Nehemiah Settle Tanna Savoy, DO [facility] None  ?06/30/2021  9:35 AM Nehemiah Settle Tanna Savoy, DO [facility] None  ?07/07/2021 10:55 AM Nehemiah Settle Tanna Savoy, DO [facility] None  ?07/14/2021  9:35 AM Woodroe Mode, MD [facility] None  ?07/21/2021 10:55 AM Nehemiah Settle Tanna Savoy, DO [facility] None  ? ? ?Truett Mainland, DO ?

## 2021-06-07 ENCOUNTER — Encounter: Payer: BC Managed Care – PPO | Admitting: Advanced Practice Midwife

## 2021-06-08 ENCOUNTER — Ambulatory Visit: Payer: BC Managed Care – PPO | Attending: Obstetrics and Gynecology

## 2021-06-08 ENCOUNTER — Encounter: Payer: Self-pay | Admitting: *Deleted

## 2021-06-08 ENCOUNTER — Ambulatory Visit: Payer: BC Managed Care – PPO | Admitting: *Deleted

## 2021-06-08 VITALS — BP 131/82 | HR 96

## 2021-06-08 DIAGNOSIS — O3663X0 Maternal care for excessive fetal growth, third trimester, not applicable or unspecified: Secondary | ICD-10-CM

## 2021-06-08 DIAGNOSIS — E669 Obesity, unspecified: Secondary | ICD-10-CM | POA: Diagnosis not present

## 2021-06-08 DIAGNOSIS — O0993 Supervision of high risk pregnancy, unspecified, third trimester: Secondary | ICD-10-CM | POA: Diagnosis not present

## 2021-06-08 DIAGNOSIS — Z3A34 34 weeks gestation of pregnancy: Secondary | ICD-10-CM

## 2021-06-08 DIAGNOSIS — O24415 Gestational diabetes mellitus in pregnancy, controlled by oral hypoglycemic drugs: Secondary | ICD-10-CM | POA: Insufficient documentation

## 2021-06-08 DIAGNOSIS — O99213 Obesity complicating pregnancy, third trimester: Secondary | ICD-10-CM

## 2021-06-09 ENCOUNTER — Encounter: Payer: BC Managed Care – PPO | Admitting: Family Medicine

## 2021-06-09 ENCOUNTER — Encounter: Payer: Self-pay | Admitting: General Practice

## 2021-06-14 ENCOUNTER — Encounter: Payer: BC Managed Care – PPO | Admitting: Advanced Practice Midwife

## 2021-06-15 ENCOUNTER — Ambulatory Visit: Payer: BC Managed Care – PPO | Attending: Obstetrics and Gynecology

## 2021-06-15 ENCOUNTER — Encounter: Payer: Self-pay | Admitting: *Deleted

## 2021-06-15 ENCOUNTER — Ambulatory Visit: Payer: BC Managed Care – PPO | Admitting: *Deleted

## 2021-06-15 VITALS — BP 139/88 | HR 109

## 2021-06-15 DIAGNOSIS — O99213 Obesity complicating pregnancy, third trimester: Secondary | ICD-10-CM | POA: Diagnosis not present

## 2021-06-15 DIAGNOSIS — E669 Obesity, unspecified: Secondary | ICD-10-CM | POA: Diagnosis not present

## 2021-06-15 DIAGNOSIS — O24415 Gestational diabetes mellitus in pregnancy, controlled by oral hypoglycemic drugs: Secondary | ICD-10-CM | POA: Diagnosis not present

## 2021-06-15 DIAGNOSIS — O3663X0 Maternal care for excessive fetal growth, third trimester, not applicable or unspecified: Secondary | ICD-10-CM | POA: Insufficient documentation

## 2021-06-15 DIAGNOSIS — O0993 Supervision of high risk pregnancy, unspecified, third trimester: Secondary | ICD-10-CM | POA: Insufficient documentation

## 2021-06-15 DIAGNOSIS — Z3A35 35 weeks gestation of pregnancy: Secondary | ICD-10-CM

## 2021-06-21 ENCOUNTER — Encounter: Payer: BC Managed Care – PPO | Admitting: Advanced Practice Midwife

## 2021-06-23 ENCOUNTER — Ambulatory Visit (INDEPENDENT_AMBULATORY_CARE_PROVIDER_SITE_OTHER): Payer: BC Managed Care – PPO | Admitting: Family Medicine

## 2021-06-23 ENCOUNTER — Ambulatory Visit: Payer: BC Managed Care – PPO | Admitting: *Deleted

## 2021-06-23 ENCOUNTER — Other Ambulatory Visit (HOSPITAL_COMMUNITY)
Admission: RE | Admit: 2021-06-23 | Discharge: 2021-06-23 | Disposition: A | Discharge status: Home / Self Care | Payer: BC Managed Care – PPO | Source: Ambulatory Visit | Attending: Advanced Practice Midwife | Admitting: Advanced Practice Midwife

## 2021-06-23 ENCOUNTER — Ambulatory Visit (INDEPENDENT_AMBULATORY_CARE_PROVIDER_SITE_OTHER): Payer: BC Managed Care – PPO

## 2021-06-23 VITALS — BP 132/86 | HR 98 | Wt 270.0 lb

## 2021-06-23 VITALS — BP 138/90 | HR 96 | Wt 270.0 lb

## 2021-06-23 DIAGNOSIS — O0993 Supervision of high risk pregnancy, unspecified, third trimester: Secondary | ICD-10-CM | POA: Insufficient documentation

## 2021-06-23 DIAGNOSIS — O99210 Obesity complicating pregnancy, unspecified trimester: Secondary | ICD-10-CM | POA: Insufficient documentation

## 2021-06-23 DIAGNOSIS — O24419 Gestational diabetes mellitus in pregnancy, unspecified control: Secondary | ICD-10-CM | POA: Diagnosis not present

## 2021-06-23 DIAGNOSIS — O24415 Gestational diabetes mellitus in pregnancy, controlled by oral hypoglycemic drugs: Secondary | ICD-10-CM | POA: Diagnosis not present

## 2021-06-23 DIAGNOSIS — O3663X0 Maternal care for excessive fetal growth, third trimester, not applicable or unspecified: Secondary | ICD-10-CM

## 2021-06-23 DIAGNOSIS — Z3A36 36 weeks gestation of pregnancy: Secondary | ICD-10-CM

## 2021-06-23 DIAGNOSIS — R030 Elevated blood-pressure reading, without diagnosis of hypertension: Secondary | ICD-10-CM

## 2021-06-23 LAB — POCT URINALYSIS DIPSTICK OB
Blood, UA: NEGATIVE
Glucose, UA: NEGATIVE
Nitrite, UA: NEGATIVE

## 2021-06-23 MED ORDER — INSULIN PEN NEEDLE 33G X 4 MM MISC: 1.0000 [IU] | Freq: Every day | 0 refills | Status: DC

## 2021-06-23 MED ORDER — INSULIN DETEMIR 100 UNIT/ML FLEXPEN: 15.0000 [IU] | PEN_INJECTOR | Freq: Every day | SUBCUTANEOUS | 11 refills | Status: DC

## 2021-06-23 NOTE — Progress Notes (Signed)

## 2021-06-23 NOTE — Progress Notes (Signed)
? ?  PRENATAL VISIT NOTE ? ?Subjective:  ?Jodi Roberts is a 31 y.o. G2P0010 at 45w4dbeing seen today for ongoing prenatal care.  She is currently monitored for the following issues for this high-risk pregnancy and has Status post total left knee replacement; Supervision of high-risk pregnancy, third trimester; Gestational diabetes mellitus (GDM) in third trimester; Macrosomia of fetus affecting management of mother, antepartum, third trimester; and Obesity in pregnancy, antepartum on their problem list. ? ?Patient reports no complaints.  Contractions: Not present. Vag. Bleeding: None.  Movement: Present. Denies leaking of fluid.  ? ?The following portions of the patient's history were reviewed and updated as appropriate: allergies, current medications, past family history, past medical history, past social history, past surgical history and problem list.  ? ?Objective:  ? ?Vitals:  ? 06/23/21 1038 06/23/21 1103  ?BP: (!) 130/93 138/90  ?Pulse: 98 96  ?Weight: 270 lb (122.5 kg)   ? ? ?Fetal Status: Fetal Heart Rate (bpm): 150   Movement: Present    ? ?General:  Alert, oriented and cooperative. Patient is in no acute distress.  ?Skin: Skin is warm and dry. No rash noted.   ?Cardiovascular: Normal heart rate noted  ?Respiratory: Normal respiratory effort, no problems with respiration noted  ?Abdomen: Soft, gravid, appropriate for gestational age.  Pain/Pressure: Present     ?Pelvic: Cervical exam performed in the presence of a chaperone        ?Extremities: Normal range of motion.  Edema: None  ?Mental Status: Normal mood and affect. Normal behavior. Normal judgment and thought content.  ? ?Assessment and Plan:  ?Pregnancy: G2P0010 at 381w4d1. Supervision of high-risk pregnancy, third trimester ?FHT normal ?- GC[facility]lam[facility] Culture, beta strep (group b only) ?- POC Urinalysis Dipstick OB ? ?2. [redacted] weeks gestation of pregnancy ?- GC/[facility]lamydia probe amp ([place]  Culture, beta strep (group b only) ?- POC Urinalysis Dipstick OB ? ?3. Gestational diabetes mellitus (GDM) in third trimester, gestational diabetes method of control unspecified ?PP slightly elevated. Add levemir 15 units daily. ?Continue metformin 100020mID ?- GC[facility]lamydia probe amp ([place] Culture, beta strep (group b only) ?- POC Urinalysis Dipstick OB ? ?4. Macrosomia of fetus affecting management of mother, antepartum, third trimester ?Induction 38 weeks, unless BP on Monday still elevated.  ?- GC/Chlamydia probe amp ([place] Culture, beta strep (group b only) ?- POC Urinalysis Dipstick OB ? ?5. Obesity in pregnancy, antepartum ? ?- [facility]lam[facility] probe amp ()[facility] Culture, beta strep (group b only) ?- POC Urinalysis Dipstick OB ? ?6. Elevated BP without diagnosis of hypertension ?Check BP on Monday. Check labs today. ?- CBC ?- Comp Met (CMET) ?- Protein / creatinine ratio, urine ? ? ?Preterm labor symptoms and general obstetric precautions including but not limited to vaginal bleeding, contractions, leaking of fluid and fetal movement were reviewed in detail with the patient. ?Please refer to After Visit Summary for other counseling recommendations.  ? ?No follow-ups on file. ? ?Future Appointments  ?Date Time Provider [place]White [place]5/12/2021  1:15 PM [facility] NST [facility] WMC  ?06/29/2021  1:15 PM [facility] NST [facility] WMC  ?06/30/2021  9:35 AM StiNehemiah SettlecTanna SavoyO [facility] None  ?07/07/2021 10:55 AM StiNehemiah SettlecTanna SavoyO [facility] None  ?07/14/2021  9:35 AM ArnWoodroe ModeD [facility] None  ?07/21/2021 10:55 AM StiNehemiah SettlecTanna SavoyO [facility] None  ? ? ?JacTruett MainlandO ?

## 2021-06-24 LAB — CBC
Hematocrit: 31.2 % — ABNORMAL LOW (ref 34.0–46.6)
Hemoglobin: 10.4 g/dL — ABNORMAL LOW (ref 11.1–15.9)
MCH: 29.5 pg (ref 26.6–33.0)
MCHC: 33.3 g/dL (ref 31.5–35.7)
MCV: 88 fL (ref 79–97)
Platelets: 228 10*3/uL (ref 150–450)
RBC: 3.53 x10E6/uL — ABNORMAL LOW (ref 3.77–5.28)
RDW: 14.4 % (ref 11.7–15.4)
WBC: 6.8 10*3/uL (ref 3.4–10.8)

## 2021-06-24 LAB — COMPREHENSIVE METABOLIC PANEL
ALT: 20 IU/L (ref 0–32)
AST: 18 IU/L (ref 0–40)
Albumin/Globulin Ratio: 1.1 — ABNORMAL LOW (ref 1.2–2.2)
Albumin: 3.4 g/dL — ABNORMAL LOW (ref 3.9–5.0)
Alkaline Phosphatase: 163 IU/L — ABNORMAL HIGH (ref 44–121)
BUN/Creatinine Ratio: 16 (ref 9–23)
BUN: 8 mg/dL (ref 6–20)
Bilirubin Total: 0.3 mg/dL (ref 0.0–1.2)
CO2: 18 mmol/L — ABNORMAL LOW (ref 20–29)
Calcium: 8.7 mg/dL (ref 8.7–10.2)
Chloride: 104 mmol/L (ref 96–106)
Creatinine, Ser: 0.5 mg/dL — ABNORMAL LOW (ref 0.57–1.00)
Globulin, Total: 3 g/dL (ref 1.5–4.5)
Glucose: 98 mg/dL (ref 70–99)
Potassium: 4 mmol/L (ref 3.5–5.2)
Sodium: 137 mmol/L (ref 134–144)
Total Protein: 6.4 g/dL (ref 6.0–8.5)
eGFR: 129 mL/min/{1.73_m2} (ref >59)

## 2021-06-24 LAB — GC/CHLAMYDIA PROBE AMP (~~LOC~~) NOT AT ARMC
Chlamydia: NEGATIVE
Comment: NEGATIVE
Comment: NORMAL
Neisseria Gonorrhea: NEGATIVE

## 2021-06-24 LAB — PROTEIN / CREATININE RATIO, URINE
Creatinine, Urine: 206.7 mg/dL
Protein, Ur: 90.9 mg/dL
Protein/Creat Ratio: 440 mg/g creat — ABNORMAL HIGH (ref 0–200)

## 2021-06-27 ENCOUNTER — Other Ambulatory Visit: Payer: Self-pay

## 2021-06-27 ENCOUNTER — Ambulatory Visit (INDEPENDENT_AMBULATORY_CARE_PROVIDER_SITE_OTHER): Payer: BC Managed Care – PPO | Admitting: Obstetrics & Gynecology

## 2021-06-27 ENCOUNTER — Encounter (HOSPITAL_COMMUNITY): Payer: Self-pay | Admitting: Family Medicine

## 2021-06-27 ENCOUNTER — Inpatient Hospital Stay (HOSPITAL_COMMUNITY)
Admission: AD | Admit: 2021-06-27 | Discharge: 2021-07-01 | DRG: 787 | Disposition: A | Discharge status: Home / Self Care | Payer: BC Managed Care – PPO | Attending: Obstetrics and Gynecology | Admitting: Obstetrics and Gynecology

## 2021-06-27 VITALS — BP 130/85 | HR 103 | Wt 270.0 lb

## 2021-06-27 DIAGNOSIS — O164 Unspecified maternal hypertension, complicating childbirth: Secondary | ICD-10-CM | POA: Diagnosis not present

## 2021-06-27 DIAGNOSIS — O9081 Anemia of the puerperium: Secondary | ICD-10-CM | POA: Diagnosis not present

## 2021-06-27 DIAGNOSIS — O0993 Supervision of high risk pregnancy, unspecified, third trimester: Secondary | ICD-10-CM

## 2021-06-27 DIAGNOSIS — Z3A37 37 weeks gestation of pregnancy: Secondary | ICD-10-CM

## 2021-06-27 DIAGNOSIS — O1404 Mild to moderate pre-eclampsia, complicating childbirth: Principal | ICD-10-CM | POA: Diagnosis present

## 2021-06-27 DIAGNOSIS — Z98891 History of uterine scar from previous surgery: Principal | ICD-10-CM

## 2021-06-27 DIAGNOSIS — O1493 Unspecified pre-eclampsia, third trimester: Secondary | ICD-10-CM | POA: Diagnosis present

## 2021-06-27 DIAGNOSIS — O24419 Gestational diabetes mellitus in pregnancy, unspecified control: Secondary | ICD-10-CM | POA: Diagnosis present

## 2021-06-27 DIAGNOSIS — O3663X0 Maternal care for excessive fetal growth, third trimester, not applicable or unspecified: Secondary | ICD-10-CM | POA: Diagnosis not present

## 2021-06-27 DIAGNOSIS — O1494 Unspecified pre-eclampsia, complicating childbirth: Secondary | ICD-10-CM | POA: Diagnosis not present

## 2021-06-27 DIAGNOSIS — D62 Acute posthemorrhagic anemia: Secondary | ICD-10-CM | POA: Diagnosis not present

## 2021-06-27 DIAGNOSIS — Z7982 Long term (current) use of aspirin: Secondary | ICD-10-CM

## 2021-06-27 DIAGNOSIS — O24424 Gestational diabetes mellitus in childbirth, insulin controlled: Secondary | ICD-10-CM | POA: Diagnosis present

## 2021-06-27 DIAGNOSIS — O99210 Obesity complicating pregnancy, unspecified trimester: Secondary | ICD-10-CM | POA: Diagnosis present

## 2021-06-27 DIAGNOSIS — Z3490 Encounter for supervision of normal pregnancy, unspecified, unspecified trimester: Secondary | ICD-10-CM

## 2021-06-27 DIAGNOSIS — O99214 Obesity complicating childbirth: Secondary | ICD-10-CM | POA: Diagnosis not present

## 2021-06-27 DIAGNOSIS — Z96652 Presence of left artificial knee joint: Secondary | ICD-10-CM | POA: Diagnosis present

## 2021-06-27 DIAGNOSIS — Z3A00 Weeks of gestation of pregnancy not specified: Secondary | ICD-10-CM | POA: Diagnosis not present

## 2021-06-27 DIAGNOSIS — O24429 Gestational diabetes mellitus in childbirth, unspecified control: Secondary | ICD-10-CM | POA: Diagnosis not present

## 2021-06-27 DIAGNOSIS — O1414 Severe pre-eclampsia complicating childbirth: Secondary | ICD-10-CM | POA: Diagnosis not present

## 2021-06-27 LAB — TYPE AND SCREEN
ABO/RH(D): O POS
Antibody Screen: NEGATIVE

## 2021-06-27 LAB — RPR: RPR Ser Ql: NONREACTIVE

## 2021-06-27 LAB — PROTEIN / CREATININE RATIO, URINE
Creatinine, Urine: 248.09 mg/dL
Protein Creatinine Ratio: 0.25 mg/mg{Cre} — ABNORMAL HIGH (ref 0.00–0.15)
Total Protein, Urine: 63 mg/dL

## 2021-06-27 LAB — CULTURE, BETA STREP (GROUP B ONLY): Strep Gp B Culture: NEGATIVE

## 2021-06-27 LAB — CBC
HCT: 31.4 % — ABNORMAL LOW (ref 36.0–46.0)
Hemoglobin: 10.3 g/dL — ABNORMAL LOW (ref 12.0–15.0)
MCH: 29.5 pg (ref 26.0–34.0)
MCHC: 32.8 g/dL (ref 30.0–36.0)
MCV: 90 fL (ref 80.0–100.0)
Platelets: 231 10*3/uL (ref 150–400)
RBC: 3.49 MIL/uL — ABNORMAL LOW (ref 3.87–5.11)
RDW: 15.6 % — ABNORMAL HIGH (ref 11.5–15.5)
WBC: 7.6 10*3/uL (ref 4.0–10.5)
nRBC: 0 % (ref 0.0–0.2)

## 2021-06-27 LAB — COMPREHENSIVE METABOLIC PANEL
ALT: 19 U/L (ref 0–44)
AST: 21 U/L (ref 15–41)
Albumin: 2.6 g/dL — ABNORMAL LOW (ref 3.5–5.0)
Alkaline Phosphatase: 139 U/L — ABNORMAL HIGH (ref 38–126)
Anion gap: 9 (ref 5–15)
BUN: 11 mg/dL (ref 6–20)
CO2: 21 mmol/L — ABNORMAL LOW (ref 22–32)
Calcium: 8.9 mg/dL (ref 8.9–10.3)
Chloride: 109 mmol/L (ref 98–111)
Creatinine, Ser: 0.54 mg/dL (ref 0.44–1.00)
GFR, Estimated: >60 mL/min (ref >60)
Glucose, Bld: 81 mg/dL (ref 70–99)
Potassium: 3.8 mmol/L (ref 3.5–5.1)
Sodium: 139 mmol/L (ref 135–145)
Total Bilirubin: 0.2 mg/dL — ABNORMAL LOW (ref 0.3–1.2)
Total Protein: 6.8 g/dL (ref 6.5–8.1)

## 2021-06-27 LAB — GLUCOSE, CAPILLARY
Glucose-Capillary: 82 mg/dL (ref 70–99)
Glucose-Capillary: 87 mg/dL (ref 70–99)

## 2021-06-27 MED ORDER — ONDANSETRON HCL 4 MG/2ML IJ SOLN
4.0000 mg | Freq: Four times a day (QID) | INTRAMUSCULAR | Status: DC | PRN
  Administered 2021-06-28: 4 mg via INTRAVENOUS
  Filled 2021-06-27: qty 2

## 2021-06-27 MED ORDER — OXYTOCIN-SODIUM CHLORIDE 30-0.9 UT/500ML-% IV SOLN
2.5000 [IU]/h | INTRAVENOUS | Status: DC
  Filled 2021-06-27: qty 500

## 2021-06-27 MED ORDER — OXYTOCIN-SODIUM CHLORIDE 30-0.9 UT/500ML-% IV SOLN
1.0000 m[IU]/min | INTRAVENOUS | Status: DC
  Administered 2021-06-28: 2 m[IU]/min via INTRAVENOUS

## 2021-06-27 MED ORDER — LACTATED RINGERS IV SOLN: INTRAVENOUS | Status: DC

## 2021-06-27 MED ORDER — FENTANYL CITRATE (PF) 100 MCG/2ML IJ SOLN
INTRAMUSCULAR | Status: AC
  Filled 2021-06-27: qty 2

## 2021-06-27 MED ORDER — OXYTOCIN BOLUS FROM INFUSION: 333.0000 mL | Freq: Once | INTRAVENOUS | Status: DC

## 2021-06-27 MED ORDER — EPHEDRINE 5 MG/ML INJ: 10.0000 mg | INTRAVENOUS | Status: DC | PRN

## 2021-06-27 MED ORDER — TERBUTALINE SULFATE 1 MG/ML IJ SOLN
0.2500 mg | Freq: Once | INTRAMUSCULAR | Status: DC | PRN
Start: 2021-06-27 — End: 2021-06-29

## 2021-06-27 MED ORDER — LIDOCAINE HCL (PF) 1 % IJ SOLN: 30.0000 mL | INTRAMUSCULAR | Status: DC | PRN

## 2021-06-27 MED ORDER — LACTATED RINGERS IV SOLN: 500.0000 mL | Freq: Once | INTRAVENOUS | Status: DC

## 2021-06-27 MED ORDER — FENTANYL CITRATE (PF) 100 MCG/2ML IJ SOLN
100.0000 ug | INTRAMUSCULAR | Status: DC | PRN
  Administered 2021-06-27 – 2021-06-28 (×9): 100 ug via INTRAVENOUS
  Filled 2021-06-27 (×8): qty 2

## 2021-06-27 MED ORDER — MISOPROSTOL 50MCG HALF TABLET
50.0000 ug | ORAL_TABLET | ORAL | Status: DC
  Administered 2021-06-27 (×3): 50 ug via BUCCAL
  Filled 2021-06-27 (×3): qty 1

## 2021-06-27 MED ORDER — ZOLPIDEM TARTRATE 5 MG PO TABS
5.0000 mg | ORAL_TABLET | Freq: Every evening | ORAL | Status: DC | PRN
  Administered 2021-06-27 – 2021-06-28 (×2): 5 mg via ORAL
  Filled 2021-06-27 (×2): qty 1

## 2021-06-27 MED ORDER — PHENYLEPHRINE 80 MCG/ML (10ML) SYRINGE FOR IV PUSH (FOR BLOOD PRESSURE SUPPORT): 80.0000 ug | PREFILLED_SYRINGE | INTRAVENOUS | Status: DC | PRN

## 2021-06-27 MED ORDER — LACTATED RINGERS IV SOLN: 500.0000 mL | INTRAVENOUS | Status: DC | PRN

## 2021-06-27 MED ORDER — ACETAMINOPHEN 325 MG PO TABS: 650.0000 mg | ORAL_TABLET | ORAL | Status: DC | PRN

## 2021-06-27 MED ORDER — FENTANYL-BUPIVACAINE-NACL 0.5-0.125-0.9 MG/250ML-% EP SOLN
12.0000 mL/h | EPIDURAL | Status: DC | PRN
  Administered 2021-06-28: 12 mL/h via EPIDURAL
  Filled 2021-06-27 (×2): qty 250

## 2021-06-27 MED ORDER — SOD CITRATE-CITRIC ACID 500-334 MG/5ML PO SOLN
30.0000 mL | ORAL | Status: DC | PRN
  Filled 2021-06-27: qty 30

## 2021-06-27 MED ORDER — DIPHENHYDRAMINE HCL 50 MG/ML IJ SOLN
12.5000 mg | INTRAMUSCULAR | Status: DC | PRN
  Administered 2021-06-28: 12.5 mg via INTRAVENOUS
  Filled 2021-06-27: qty 1

## 2021-06-27 MED ORDER — PHENYLEPHRINE 80 MCG/ML (10ML) SYRINGE FOR IV PUSH (FOR BLOOD PRESSURE SUPPORT)
80.0000 ug | PREFILLED_SYRINGE | INTRAVENOUS | Status: DC | PRN
  Filled 2021-06-27: qty 10

## 2021-06-27 NOTE — Progress Notes (Signed)
Patient presents for blood pressure check. Patient is 37.[redacted] weeks gestation being monitored for high risk pregnancy. Was seen last Thursday Jun 23, 2021.patient has had slight headaches but denies any dizziness or blurry visit. Patient reports good fetal movements.  ? ? Kathrene Alu RN  ? ? ? ?Patient was initially assessed and managed by nursing staff during this encounter. I have reviewed the chart and agree with the documentation.  She had elevated BP last week, and has been borderline today and other weeks.  Has poorly controlled [name], insulin started last week, EFW and AC >99%.  Urine protein/creatinine ratio on 06/23/21 was 440, concerning for preeclampsia. Has mild headache, no severe features. Given all these co-morbidities at term, delivery is recommended and patient agrees with the plan.  She is being sent to [facility] for induction of labor, L&D on call providers and RN in charge notified. ? ? ?Verita Schneiders, MD ?06/27/2021 10:54 AM  ? ?

## 2021-06-27 NOTE — H&P (Addendum)
OBSTETRIC ADMISSION HISTORY AND PHYSICAL ? ?Jodi Roberts is a 31 y.o. female G2P0010 with IUP at 39w1dby 10 week ultrasound presenting for IOL for preeclampsia without severe features. She reports +FMs, No LOF, no VB, no blurry vision, headaches or peripheral edema, and RUQ pain.  She plans on breast feeding. She request Nexplanon for birth control. ?She received her prenatal care at  [facility]   ? ?Dating: By 10 week ultrasound --->  Estimated Date of Delivery: 07/17/21 ? ?Sono:   ? ?_0 , CWD, normal anatomy, cephalic presentation, 32951O 99% EFW ? ? ?Nursing Staff Provider  ?Office Location  [facility] Dating   10 week [place] ?Language    English Anatomy [place]   ?Flu Vaccine   Recommended Genetic/Carrier Screen  NIPS:  LR female ?AFP:    ?[place]izon:  ?TDaP Vaccine   04/13/2021 Hgb A1C or  ?GTT Early Hgb A1C ?Third trimester Abnml 2hr GTT > GDM  ?COVID Vaccine    LAB RESULTS   ?Rhogam   Blood Type O/Positive/-- (11/08 1025)   ?Baby Feeding Plan  Breast Antibody Negative (11/08 1025)  ?Contraception  Nexplanon Rubella 4.18 (11/08 1025)  ?Circumcision  yes RPR Non Reactive (03/01 0945)   ?Pediatrician   HBsAg Negative (11/08 1025)   ?Support Person Dr. GAbner GreenspanHCVAb Negative (11/08 1025)  ?Prenatal Classes  HIV Non Reactive (03/01 0945)     ?BTL Consent  GBS    ?VBAC Consent  Pap 11/06/2018 Normal  ?     ?DME Rx _1  BP cuff ?_2  Weight Scale Waterbirth  _3  Class _4  Consent _5  CNM visit  ?PHQ9 & GAD7 [  ] new OB ?[  ] 28 weeks  ?[  ] 36 weeks Induction  _6  Orders Entered _7 Foley Y/N  ? ?Prenatal History/Complications: preeclampsia without severe features, [name] on insulin and metformin ? ?Past Medical History: ?Past Medical History:  ?Diagnosis Date  ? Arthritis   ? osteoarthritis  ? ? ?Past Surgical History: ?Past Surgical History:  ?Procedure Laterality Date  ? DILATION AND CURETTAGE OF UTERUS  2016  ? SAB  ? KNEE ARTHROSCOPY Right   ? reconstruction surgery  ? KNEE CARTILAGE SURGERY Left   ? KNEE CLOSED REDUCTION Left 03/07/2019  ?  Procedure: CLOSED MANIPULATION LEFT  KNEE;  Surgeon: XLeandrew Koyanagi MD;  [place] [facility]  Service: Orthopedics;  Laterality: Left;  ? LEG SURGERY Right   ? rods and screws  ? TOTAL KNEE ARTHROPLASTY Left 12/09/2018  ? TOTAL KNEE ARTHROPLASTY Left 12/09/2018  ? Procedure: LEFT TOTAL KNEE ARTHROPLASTY;  Surgeon: XLeandrew Koyanagi MD;  [place]  Service: Orthopedics;  Laterality: Left;  ? WISDOM TOOTH EXTRACTION    ? ? ?Obstetrical History: ?OB History   ? ? Gravida  ?2  ? Para  ?0  ? Term  ?   ? Preterm  ?   ? AB  ?1  ? Living  ?0  ?  ? ? SAB  ?1  ? IAB  ?   ? Ectopic  ?   ? Multiple  ?   ? Live Births  ?   ?   ?  ?  ? ? ?Social History ?Social History  ? ?Socioeconomic History  ? Marital status: Single  ?  Spouse name: Not on file  ? Number of children: 0  ? Years of education: Not on file  ? Highest education level: High school graduate  ?Occupational History  ?  Not on file  ?Tobacco Use  ? Smoking status: Never  ? Smokeless tobacco: Never  ?Vaping Use  ? Vaping Use: Never used  ?Substance and Sexual Activity  ? Alcohol use: Never  ? Drug use: Never  ? Sexual activity: Yes  ?  Birth control/protection: Condom  ?Other Topics Concern  ? Not on file  ?Social History Narrative  ? Not on file  ? ?Social Determinants of Health  ? ?Financial Resource Strain: Not on file  ?Food Insecurity: Not on file  ?Transportation Needs: Not on file  ?Physical Activity: Not on file  ?Stress: Not on file  ?Social Connections: Not on file  ? ? ?Family History: ?Family History  ?Problem Relation Age of Onset  ? Cancer Neg Hx   ? Diabetes Neg Hx   ? Hypertension Neg Hx   ? ? ?Allergies: ?Allergies  ?Allergen Reactions  ? Shellfish Allergy Anaphylaxis and Swelling  ?  Throat closes Only with eating.  Ok to touch iodine or betadine.  ? Sulfa Antibiotics Anaphylaxis  ? ? ?Medications Prior to Admission  ?Medication Sig Dispense Refill Last Dose  ? Accu-Chek Softclix Lancets lancets 1 each by Other route 4 (four) times  daily. Use as instructed 100 each 12   ? albuterol (PROVENTIL HFA;VENTOLIN HFA) 108 (90 Base) MCG/ACT inhaler Inhale 1-2 puffs into the lungs every 6 (six) hours as needed for wheezing or shortness of breath. (Patient not taking: Reported on 06/27/2021) 1 Inhaler 0   ? aspirin 81 MG chewable tablet Chew 1 tablet (81 mg total) by mouth daily. After 12 weeks 30 tablet 6   ? Blood Glucose Monitoring Suppl (ACCU-CHEK GUIDE) w/Device KIT 1 kit by Does not apply route daily. 1 kit 0   ? Doxylamine-Pyridoxine (DICLEGIS) 10-10 MG TBEC Take 2 tablets by mouth at bedtime as needed (nausea). 60 tablet 3   ? glucose blood (ACCU-CHEK GUIDE) test strip Use as instructed 100 each 12   ? insulin detemir (LEVEMIR) 100 UNIT/ML FlexPen Inject 15 Units into the skin daily with breakfast. 15 mL 11   ? Insulin Pen Needle 33G X 4 MM MISC 1 Units by Does not apply route daily with breakfast. 30 each 0   ? metFORMIN (GLUCOPHAGE) 1000 MG tablet Take 1 tablet (1,000 mg total) by mouth 2 (two) times daily with a meal. 60 tablet 3   ? Prenat w/o A Vit-FeFum-FePo-FA (FOLIVANE-OB) 85-1 MG CAPS Take 1 capsule by mouth daily. 90 capsule 3   ? prenatal vitamin w/FE, FA (NATACHEW) 29-1 MG CHEW chewable tablet Chew 1 tablet by mouth daily at 12 noon. 30 tablet 12   ? ?Review of Systems  ? ?All systems reviewed and negative except as stated in HPI ? ?Blood pressure (!) 141/93, pulse 100, height _0  (1.676 m), weight 122.5 kg, last menstrual period 10/10/2020. ?General appearance: alert, cooperative, and no distress ?Lungs: clear to auscultation bilaterally ?Heart: regular rate and rhythm ?Abdomen: soft, non-tender; bowel sounds normal ?Pelvic: n/a ?Extremities: Homans sign is negative, no sign of DVT ?DTR's +2 ?Presentation: cephalic ?Fetal monitoringBaseline: 145 bpm, Variability: Good {> 6 bpm), Accelerations: Reactive, and Decelerations: Absent ?Uterine activityNone ?  ? ? ?Prenatal labs: ?ABO, Rh: --/--/PENDING (05/15 1250) ?Antibody: PENDING  (05/15 1250) ?Rubella: 4.18 (11/08 1025) ?RPR: Non Reactive (03/01 0945)  ?HBsAg: Negative (11/08 1025)  ?HIV: Non Reactive (03/01 0945)  ?GBS: Negative/-- (05/11 1046)  ? ?Prenatal Transfer Tool  ?Maternal Diabetes: Yes:  Diabetes Type:  Insulin/Medication controlled ?Genetic Screening: Normal ?  Maternal Ultrasounds/Referrals: Normal ?Fetal Ultrasounds or other Referrals:  Referred to [facility]  ?Maternal Substance Abuse:  No ?Significant Maternal Medications:  None ?Significant Maternal Lab Results: Group B Strep negative ? ?Results for orders placed or performed during the hospital encounter of 06/27/21 (from the past 24 hour(s))  ?CBC  ? Collection Time: 06/27/21 11:55 AM  ?Result Value Ref Range  ? WBC 7.6 4.0 - 10.5 K/uL  ? RBC 3.49 (L) 3.87 - 5.11 MIL/uL  ? Hemoglobin 10.3 (L) 12.0 - 15.0 g/dL  ? HCT 31.4 (L) 36.0 - 46.0 %  ? MCV 90.0 80.0 - 100.0 fL  ? MCH 29.5 26.0 - 34.0 pg  ? MCHC 32.8 30.0 - 36.0 g/dL  ? RDW 15.6 (H) 11.5 - 15.5 %  ? Platelets 231 150 - 400 K/uL  ? nRBC 0.0 0.0 - 0.2 %  ?Comprehensive metabolic panel  ? Collection Time: 06/27/21 11:55 AM  ?Result Value Ref Range  ? Sodium 139 135 - 145 mmol/L  ? Potassium 3.8 3.5 - 5.1 mmol/L  ? Chloride 109 98 - 111 mmol/L  ? CO2 21 (L) 22 - 32 mmol/L  ? Glucose, Bld 81 70 - 99 mg/dL  ? BUN 11 6 - 20 mg/dL  ? Creatinine, Ser 0.54 0.44 - 1.00 mg/dL  ? Calcium 8.9 8.9 - 10.3 mg/dL  ? Total Protein 6.8 6.5 - 8.1 g/dL  ? Albumin 2.6 (L) 3.5 - 5.0 g/dL  ? AST 21 15 - 41 U/L  ? ALT 19 0 - 44 U/L  ? Alkaline Phosphatase 139 (H) 38 - 126 U/L  ? Total Bilirubin 0.2 (L) 0.3 - 1.2 mg/dL  ? GFR, Estimated >60 >60 mL/min  ? Anion gap 9 5 - 15  ?Protein / creatinine ratio, urine  ? Collection Time: 06/27/21 12:04 PM  ?Result Value Ref Range  ? Creatinine, Urine 248.09 mg/dL  ? Total Protein, Urine 63 mg/dL  ? Protein Creatinine Ratio 0.25 (H) 0.00 - 0.15 mg/mg[Cre]  ?Type and screen  ? Collection Time: 06/27/21 12:50 PM  ?Result Value Ref Range  ?  ABO/RH(D) PENDING   ? Antibody Screen PENDING   ? Sample Expiration    ?  06/30/2021,2359 ?Performed at [facility], [place] [address], [place], [place] [address] ?  ? ? ?Patient Active Problem List  ? Diag

## 2021-06-27 NOTE — Progress Notes (Signed)
Labor Progress Note ?Jodi Roberts is a 31 y.o. G2P0010 at [redacted]w[redacted]d presented for IOL for preeclampsia without severe features ? ?S:  ?Patient resting. Used IV pain medication twice since FB placement. FB still in place ? ?O:  ?BP 133/78   Pulse 100   Ht 5\' 6"  (1.676 m)   Wt 122.5 kg   LMP 10/10/2020   BMI 43.58 kg/m?  ? ?Fetal Tracing: ? ?Baseline: 150 ?Variability: moderate ?Accels: 15x15 ?Decels: none ? ?Toco: UI ? ? ?CVE: Dilation: 1 ?Presentation: Vertex ?Exam by:: Torien Ramroop,cnm ? ? ?A&P: 31 y.o. G2P0010 [redacted]w[redacted]d IOL preeclampsia without severe features ?#Labor: Continue cytotec. Exam deferred since balloon is in place ?#Pain: IV fentanyl ?#FWB: Cat 1 ?#GBS negative ? ?[redacted]w[redacted]d, CNM ?5:46 PM ? ?

## 2021-06-27 NOTE — Progress Notes (Signed)
Patient Vitals for the past 4 hrs: ? BP Temp Temp src Pulse Resp  ?06/27/21 2032 (!) 144/76 98.2 ?F (36.8 ?C) Oral 99 18  ?06/27/21 1907 (!) 141/68 -- -- 91 --  ? ?Foley out at 2030.  FHR Cat 1. Mild ctx. Cx 4.5/50/-3.  Will give one more cytotec to try to thin cx a bit more, then will start pitocin.  ?

## 2021-06-28 ENCOUNTER — Inpatient Hospital Stay (HOSPITAL_COMMUNITY): Payer: BC Managed Care – PPO | Admitting: Anesthesiology

## 2021-06-28 ENCOUNTER — Encounter: Payer: BC Managed Care – PPO | Admitting: Advanced Practice Midwife

## 2021-06-28 LAB — COMPREHENSIVE METABOLIC PANEL
ALT: 20 U/L (ref 0–44)
AST: 23 U/L (ref 15–41)
Albumin: 2.3 g/dL — ABNORMAL LOW (ref 3.5–5.0)
Alkaline Phosphatase: 123 U/L (ref 38–126)
Anion gap: 9 (ref 5–15)
BUN: 8 mg/dL (ref 6–20)
CO2: 20 mmol/L — ABNORMAL LOW (ref 22–32)
Calcium: 8.7 mg/dL — ABNORMAL LOW (ref 8.9–10.3)
Chloride: 108 mmol/L (ref 98–111)
Creatinine, Ser: 0.88 mg/dL (ref 0.44–1.00)
GFR, Estimated: >60 mL/min (ref >60)
Glucose, Bld: 98 mg/dL (ref 70–99)
Potassium: 3.7 mmol/L (ref 3.5–5.1)
Sodium: 137 mmol/L (ref 135–145)
Total Bilirubin: 0.8 mg/dL (ref 0.3–1.2)
Total Protein: 6.4 g/dL — ABNORMAL LOW (ref 6.5–8.1)

## 2021-06-28 LAB — CBC
HCT: 30.9 % — ABNORMAL LOW (ref 36.0–46.0)
Hemoglobin: 9.9 g/dL — ABNORMAL LOW (ref 12.0–15.0)
MCH: 29.2 pg (ref 26.0–34.0)
MCHC: 32 g/dL (ref 30.0–36.0)
MCV: 91.2 fL (ref 80.0–100.0)
Platelets: 206 10*3/uL (ref 150–400)
RBC: 3.39 MIL/uL — ABNORMAL LOW (ref 3.87–5.11)
RDW: 15.8 % — ABNORMAL HIGH (ref 11.5–15.5)
WBC: 11 10*3/uL — ABNORMAL HIGH (ref 4.0–10.5)
nRBC: 0 % (ref 0.0–0.2)

## 2021-06-28 LAB — GLUCOSE, CAPILLARY
Glucose-Capillary: 106 mg/dL — ABNORMAL HIGH (ref 70–99)
Glucose-Capillary: 84 mg/dL (ref 70–99)
Glucose-Capillary: 90 mg/dL (ref 70–99)
Glucose-Capillary: 90 mg/dL (ref 70–99)
Glucose-Capillary: 91 mg/dL (ref 70–99)

## 2021-06-28 MED ORDER — LIDOCAINE-EPINEPHRINE (PF) 2 %-1:200000 IJ SOLN
INTRAMUSCULAR | Status: DC | PRN
  Administered 2021-06-28: 5 mL via EPIDURAL

## 2021-06-28 NOTE — Anesthesia Preprocedure Evaluation (Signed)
Anesthesia Evaluation  ?Patient identified by MRN, date of birth, ID band ?Patient awake ? ? ? ?Reviewed: ?Allergy & Precautions, NPO status , Patient's Chart, lab work & pertinent test results ? ?Airway ?Mallampati: III ? ?TM Distance: >3 FB ?Neck ROM: Full ? ? ? Dental ?no notable dental hx. ? ?  ?Pulmonary ?neg pulmonary ROS,  ?  ?Pulmonary exam normal ?breath sounds clear to auscultation ? ? ? ? ? ? Cardiovascular ?hypertension (preE without severe features), Normal cardiovascular exam ?Rhythm:Regular Rate:Normal ? ? ?  ?Neuro/Psych ?negative neurological ROS ? negative psych ROS  ? GI/Hepatic ?negative GI ROS, Neg liver ROS,   ?Endo/Other  ?diabetes, GestationalMorbid obesity (BMI 44) ? Renal/GU ?negative Renal ROS  ?negative genitourinary ?  ?Musculoskeletal ? ?(+) Arthritis ,  ? Abdominal ?  ?Peds ? Hematology ?negative hematology ROS ?(+)   ?Anesthesia Other Findings ?IOL for preE without severe features ? Reproductive/Obstetrics ?(+) Pregnancy ? ?  ? ? ? ? ? ? ? ? ? ? ? ? ? ?  ?  ? ? ? ? ? ? ? ? ?Anesthesia Physical ?Anesthesia Plan ? ?ASA: 3 ? ?Anesthesia Plan: Epidural  ? ?Post-op Pain Management:   ? ?Induction:  ? ?PONV Risk Score and Plan: Treatment may vary due to age or medical condition ? ?Airway Management Planned: Natural Airway ? ?Additional Equipment:  ? ?Intra-op Plan:  ? ?Post-operative Plan:  ? ?Informed Consent: I have reviewed the patients History and Physical, chart, labs and discussed the procedure including the risks, benefits and alternatives for the proposed anesthesia with the patient or authorized representative who has indicated his/her understanding and acceptance.  ? ? ? ? ? ?Plan Discussed with: Anesthesiologist ? ?Anesthesia Plan Comments: (Patient identified. Risks, benefits, options discussed with patient including but not limited to bleeding, infection, nerve damage, paralysis, failed block, incomplete pain control, headache, blood pressure  changes, nausea, vomiting, reactions to medication, itching, and post partum back pain. Confirmed with bedside nurse the patient's most recent platelet count. Confirmed with the patient that they are not taking any anticoagulation, have any bleeding history or any family history of bleeding disorders. Patient expressed understanding and wishes to proceed. All questions were answered. )  ? ? ? ? ? ? ?Anesthesia Quick Evaluation ? ?

## 2021-06-28 NOTE — Progress Notes (Addendum)
Labor Progress Note ?Jodi Roberts is a 31 y.o. G2P0010 at [redacted]w[redacted]d presented for IOL d/t pre-e without SF.  ? ?S: Doing well, comfortable with epidural.  ? ?O:  ?BP 130/74   Pulse 95   Temp 98 ?F (36.7 ?C) (Oral)   Resp 18   Ht 5\' 6"  (1.676 m)   Wt 122 kg   LMP 10/10/2020   BMI 43.41 kg/m?  ?EFM: 150/mod/10x10/none ? ?CVE: Dilation: 4 ?Effacement (%):80 ?Station: -2 ?Presentation: Vertex ?Exam by:: [email or web address] ? ? ?A&P: 31 y.o. G2P0010 [redacted]w[redacted]d  ?#Labor: Unchanged since last check overnight, and subjectively less dilation then previously stated (but more effaced). Initially planned to AROM, however noted hair without membrane on exam and clear fluid on glove. Patient reports feeling some "discharge/mucus" sometime overnight, but not sure exactly when. After verbal consent, placed IUPC. Cont to titrate pit as tolerated.  ?#Pain: Epidural  ?#FWB: Cat I ?#GBS negative ? ?#Pre-e without SF: BP recently WNL. CBC this am without plts WNL, plan to get repeat CMP (last 24 hours ago). No symptoms. Cont to monitor.  ? ?#[name]: CBGs have been appropriate w/ monitoring only.  ? ?[redacted]w[redacted]d, DO ?11:01 AM  ?

## 2021-06-28 NOTE — Progress Notes (Signed)
Labor Progress Note ?Jodi Roberts is a 31 y.o. G2P0010 at [redacted]w[redacted]d who presented for IOL due to pre-eclampsia without severe features.  ? ?S: Doing well. Feeling intermittent pressure in her vagina with contractions. No other concerns at this time.  ? ?O:  ?BP (!) 145/98   Pulse 100   Temp 98.3 ?F (36.8 ?C) (Oral)   Resp 18   Ht 5\' 6"  (1.676 m)   Wt 122 kg   LMP 10/10/2020   BMI 43.41 kg/m?  ? ?EFM: Baseline 155 bpm, moderate variability, + accels, no decels  ?Toco: Every 2-3 minutes, MVUs adequate  ? ?CVE: Dilation: 5 (swollen right side) ?Effacement (%): 60 ?Station: 0 ?Presentation: Vertex ?Exam by:: lee ? ?A&P: 31 y.o. G2P0010 [redacted]w[redacted]d  ? ?#Labor: Progressing well. Contraction pattern adequate for the last couple of hours. Pitocin currently at 16 milli-Units/min. Will continue current management. Plan to recheck after 4 hours of adequate contractions, sooner if pressure becomes constant or she feels the urge to push.  ?#Pain: Epidural  ?#FWB: Cat 1  ?#GBS negative ? ?#Pre-eclampsia without severe features: BP normal to mild range. No symptoms. Will continue to monitor.  ? ?#GDMA2: CBGs remain within normal limits. Will continue with q4hr glucose checks in latent phase.  ? ?[redacted]w[redacted]d, MD ?10:41 PM ? ?

## 2021-06-28 NOTE — Progress Notes (Addendum)
Labor Progress Note ?Jodi Roberts is a 31 y.o. G2P0010 at [redacted]w[redacted]d presented for IOL due to pre-e w/o SF.  ? ?S: Comfortable with epidural. No complaints.  ? ?O:  ?BP 136/84   Pulse 97   Temp 98.1 ?F (36.7 ?C) (Oral)   Resp 18   Ht 5\' 6"  (1.676 m)   Wt 122 kg   LMP 10/10/2020   BMI 43.41 kg/m?  ?EFM: 170/mod/15x15/recurrent lates  ? ?CVE: Dilation: 5 ?Effacement (%): 60 ?Station: -2 ?Presentation: Vertex ?Exam by:: Kyrel Leighton ? ? ?A&P: 31 y.o. G2P0010 [redacted]w[redacted]d  ?#Labor: Some progression since last check with pit currently at 20. Fetal head does feel asynclitic. She has had adequate contractions for at least the past 3 hours via IUPC. However, now having recurrent late decelerations and fetal tachycardia that has not resolved with conservative measures (fluid bolus, position changes). Afebrile. Will do a pit break and reassess.  ?#Pain: Epidural  ?#FWB: Cat II -- intervention as above and reassuring mod var  ?#GBS negative ? ?[name], DO ?2:19 PM  ?

## 2021-06-28 NOTE — Anesthesia Procedure Notes (Signed)
Epidural ?Patient location during procedure: OB ?Start time: 06/28/2021 8:10 AM ?End time: 06/28/2021 8:20 AM ? ?Staffing ?Anesthesiologist: Elmer Picker, MD ?Performed: anesthesiologist  ? ?Preanesthetic Checklist ?Completed: patient identified, IV checked, risks and benefits discussed, monitors and equipment checked, pre-op evaluation and timeout performed ? ?Epidural ?Patient position: sitting ?Prep: DuraPrep and site prepped and draped ?Patient monitoring: continuous pulse ox, blood pressure, heart rate and cardiac monitor ?Approach: midline ?Location: L3-L4 ?Injection technique: LOR air ? ?Needle:  ?Needle type: Tuohy  ?Needle gauge: 17 G ?Needle length: 9 cm ?Needle insertion depth: 8 cm ?Catheter type: closed end flexible ?Catheter size: 19 Gauge ?Catheter at skin depth: 15 cm ?Test dose: negative ? ?Assessment ?Sensory level: T8 ?Events: blood not aspirated, injection not painful, no injection resistance, no paresthesia and negative IV test ? ?Additional Notes ?Patient identified. Risks/Benefits/Options discussed with patient including but not limited to bleeding, infection, nerve damage, paralysis, failed block, incomplete pain control, headache, blood pressure changes, nausea, vomiting, reactions to medication both or allergic, itching and postpartum back pain. Confirmed with bedside nurse the patient's most recent platelet count. Confirmed with patient that they are not currently taking any anticoagulation, have any bleeding history or any family history of bleeding disorders. Patient expressed understanding and wished to proceed. All questions were answered. Sterile technique was used throughout the entire procedure. Please see nursing notes for vital signs. Test dose was given through epidural catheter and negative prior to continuing to dose epidural or start infusion. Warning signs of high block given to the patient including shortness of breath, tingling/numbness in hands, complete motor block,  or any concerning symptoms with instructions to call for help. Patient was given instructions on fall risk and not to get out of bed. All questions and concerns addressed with instructions to call with any issues or inadequate analgesia.  Reason for block:procedure for pain ? ? ? ?

## 2021-06-28 NOTE — Progress Notes (Signed)
Patient Vitals for the past 4 hrs: ? BP Temp Temp src Pulse Resp  ?06/28/21 0609 -- 98.1 ?F (36.7 ?C) Oral -- --  ?06/28/21 0600 112/60 -- -- 94 17  ?06/28/21 0530 137/80 -- -- 85 --  ?06/28/21 0500 120/71 -- -- 90 17  ?06/28/21 0430 131/74 -- -- 86 17  ?06/28/21 0400 135/73 -- -- 93 --  ?06/28/21 0330 126/70 -- -- 90 18  ?06/28/21 0300 126/75 -- -- 95 18  ? ?Ptiocin started around 0230, now at 10  mu/min. .  FHR Cat 1.  Ctx still mild, q 2-4 minutes.  Last cx check was 5.5/60/-2.  Continue to titrate pitocin until labor adequate ?

## 2021-06-29 ENCOUNTER — Encounter (HOSPITAL_COMMUNITY)
Admission: AD | Disposition: A | Discharge status: Home / Self Care | Payer: Self-pay | Source: Home / Self Care | Attending: Obstetrics and Gynecology

## 2021-06-29 ENCOUNTER — Encounter (HOSPITAL_COMMUNITY): Payer: Self-pay | Admitting: Family Medicine

## 2021-06-29 ENCOUNTER — Other Ambulatory Visit: Payer: BC Managed Care – PPO

## 2021-06-29 DIAGNOSIS — Z3A37 37 weeks gestation of pregnancy: Secondary | ICD-10-CM

## 2021-06-29 DIAGNOSIS — O1404 Mild to moderate pre-eclampsia, complicating childbirth: Secondary | ICD-10-CM

## 2021-06-29 DIAGNOSIS — Z98891 History of uterine scar from previous surgery: Principal | ICD-10-CM

## 2021-06-29 DIAGNOSIS — O24424 Gestational diabetes mellitus in childbirth, insulin controlled: Secondary | ICD-10-CM

## 2021-06-29 LAB — CBC
HCT: 29.1 % — ABNORMAL LOW (ref 36.0–46.0)
Hemoglobin: 9.3 g/dL — ABNORMAL LOW (ref 12.0–15.0)
MCH: 28.8 pg (ref 26.0–34.0)
MCHC: 32 g/dL (ref 30.0–36.0)
MCV: 90.1 fL (ref 80.0–100.0)
Platelets: 190 10*3/uL (ref 150–400)
RBC: 3.23 MIL/uL — ABNORMAL LOW (ref 3.87–5.11)
RDW: 16 % — ABNORMAL HIGH (ref 11.5–15.5)
WBC: 14.1 10*3/uL — ABNORMAL HIGH (ref 4.0–10.5)
nRBC: 0.1 % (ref 0.0–0.2)

## 2021-06-29 LAB — GLUCOSE, CAPILLARY
Glucose-Capillary: 96 mg/dL (ref 70–99)
Glucose-Capillary: 109 mg/dL — ABNORMAL HIGH (ref 70–99)

## 2021-06-29 SURGERY — Surgical Case
Anesthesia: Epidural

## 2021-06-29 MED ORDER — SIMETHICONE 80 MG PO CHEW
80.0000 mg | CHEWABLE_TABLET | Freq: Three times a day (TID) | ORAL | Status: DC
  Administered 2021-06-29 – 2021-07-01 (×4): 80 mg via ORAL
  Filled 2021-06-29 (×6): qty 1

## 2021-06-29 MED ORDER — BUPIVACAINE HCL (PF) 0.5 % IJ SOLN
INTRAMUSCULAR | Status: AC
  Filled 2021-06-29: qty 30

## 2021-06-29 MED ORDER — SODIUM CHLORIDE 0.9 % IR SOLN
Status: DC | PRN
Start: 2021-06-29 — End: 2021-06-29
  Administered 2021-06-29: 1

## 2021-06-29 MED ORDER — COCONUT OIL OIL: 1.0000 "application " | TOPICAL_OIL | Status: DC | PRN

## 2021-06-29 MED ORDER — OXYTOCIN-SODIUM CHLORIDE 30-0.9 UT/500ML-% IV SOLN
INTRAVENOUS | Status: AC
  Filled 2021-06-29: qty 500

## 2021-06-29 MED ORDER — IBUPROFEN 600 MG PO TABS
600.0000 mg | ORAL_TABLET | Freq: Four times a day (QID) | ORAL | Status: DC
  Administered 2021-06-30 – 2021-07-01 (×5): 600 mg via ORAL
  Filled 2021-06-29 (×5): qty 1

## 2021-06-29 MED ORDER — DIBUCAINE (PERIANAL) 1 % EX OINT: 1.0000 "application " | TOPICAL_OINTMENT | CUTANEOUS | Status: DC | PRN

## 2021-06-29 MED ORDER — MENTHOL 3 MG MT LOZG: 1.0000 | LOZENGE | OROMUCOSAL | Status: DC | PRN

## 2021-06-29 MED ORDER — ACETAMINOPHEN 500 MG PO TABS
1000.0000 mg | ORAL_TABLET | Freq: Four times a day (QID) | ORAL | Status: DC
  Administered 2021-06-29 – 2021-07-01 (×8): 1000 mg via ORAL
  Filled 2021-06-29 (×8): qty 2

## 2021-06-29 MED ORDER — PRENATAL MULTIVITAMIN CH
1.0000 | ORAL_TABLET | Freq: Every day | ORAL | Status: DC
  Administered 2021-06-30: 1 via ORAL
  Filled 2021-06-29: qty 1

## 2021-06-29 MED ORDER — KETOROLAC TROMETHAMINE 30 MG/ML IJ SOLN
30.0000 mg | Freq: Four times a day (QID) | INTRAMUSCULAR | Status: AC
  Administered 2021-06-29 (×3): 30 mg via INTRAVENOUS
  Filled 2021-06-29 (×2): qty 1

## 2021-06-29 MED ORDER — LIDOCAINE-EPINEPHRINE (PF) 2 %-1:200000 IJ SOLN
INTRAMUSCULAR | Status: AC
  Filled 2021-06-29: qty 20

## 2021-06-29 MED ORDER — SODIUM CHLORIDE 0.9 % IV SOLN: INTRAVENOUS | Status: DC | PRN

## 2021-06-29 MED ORDER — TRANEXAMIC ACID-NACL 1000-0.7 MG/100ML-% IV SOLN
INTRAVENOUS | Status: AC
  Filled 2021-06-29: qty 100

## 2021-06-29 MED ORDER — DEXAMETHASONE SODIUM PHOSPHATE 4 MG/ML IJ SOLN
INTRAMUSCULAR | Status: AC
  Filled 2021-06-29: qty 1

## 2021-06-29 MED ORDER — LABETALOL HCL 5 MG/ML IV SOLN
INTRAVENOUS | Status: DC | PRN
  Administered 2021-06-29: 5 mg via INTRAVENOUS

## 2021-06-29 MED ORDER — OXYTOCIN-SODIUM CHLORIDE 30-0.9 UT/500ML-% IV SOLN
2.5000 [IU]/h | INTRAVENOUS | Status: AC
  Administered 2021-06-29: 2.5 [IU]/h via INTRAVENOUS
  Filled 2021-06-29: qty 500

## 2021-06-29 MED ORDER — LABETALOL HCL 5 MG/ML IV SOLN
5.0000 mg | Freq: Once | INTRAVENOUS | Status: AC
  Administered 2021-06-29: 5 mg via INTRAVENOUS

## 2021-06-29 MED ORDER — TRANEXAMIC ACID-NACL 1000-0.7 MG/100ML-% IV SOLN
1000.0000 mg | INTRAVENOUS | Status: AC
  Administered 2021-06-29: 1000 mg via INTRAVENOUS

## 2021-06-29 MED ORDER — FUROSEMIDE 20 MG PO TABS
20.0000 mg | ORAL_TABLET | Freq: Every day | ORAL | Status: DC
  Administered 2021-06-30 – 2021-07-01 (×2): 20 mg via ORAL
  Filled 2021-06-29 (×2): qty 1

## 2021-06-29 MED ORDER — SOD CITRATE-CITRIC ACID 500-334 MG/5ML PO SOLN
30.0000 mL | ORAL | Status: AC
  Administered 2021-06-29: 30 mL via ORAL

## 2021-06-29 MED ORDER — ONDANSETRON HCL 4 MG/2ML IJ SOLN
INTRAMUSCULAR | Status: AC
  Filled 2021-06-29: qty 2

## 2021-06-29 MED ORDER — ENOXAPARIN SODIUM 60 MG/0.6ML IJ SOSY
60.0000 mg | PREFILLED_SYRINGE | INTRAMUSCULAR | Status: DC
  Administered 2021-06-30 – 2021-07-01 (×2): 60 mg via SUBCUTANEOUS
  Filled 2021-06-29 (×2): qty 0.6

## 2021-06-29 MED ORDER — DEXMEDETOMIDINE (PRECEDEX) IN NS 20 MCG/5ML (4 MCG/ML) IV SYRINGE
PREFILLED_SYRINGE | INTRAVENOUS | Status: DC | PRN
  Administered 2021-06-29 (×2): 8 ug via INTRAVENOUS

## 2021-06-29 MED ORDER — DEXAMETHASONE SODIUM PHOSPHATE 4 MG/ML IJ SOLN
INTRAMUSCULAR | Status: DC | PRN
  Administered 2021-06-29: 4 mg via INTRAVENOUS

## 2021-06-29 MED ORDER — MORPHINE SULFATE (PF) 0.5 MG/ML IJ SOLN
INTRAMUSCULAR | Status: DC | PRN
  Administered 2021-06-29: 3 mg via EPIDURAL

## 2021-06-29 MED ORDER — SODIUM CHLORIDE 0.9 % IV SOLN
INTRAVENOUS | Status: AC
  Filled 2021-06-29: qty 5

## 2021-06-29 MED ORDER — SIMETHICONE 80 MG PO CHEW
80.0000 mg | CHEWABLE_TABLET | ORAL | Status: DC | PRN
Start: 2021-06-29 — End: 2021-07-01
  Administered 2021-06-30 (×2): 80 mg via ORAL

## 2021-06-29 MED ORDER — CEFAZOLIN IN SODIUM CHLORIDE 3-0.9 GM/100ML-% IV SOLN
INTRAVENOUS | Status: AC
  Filled 2021-06-29: qty 100

## 2021-06-29 MED ORDER — SODIUM BICARBONATE 8.4 % IV SOLN
INTRAVENOUS | Status: DC | PRN
  Administered 2021-06-29: 10 mL via EPIDURAL
  Administered 2021-06-29 (×2): 3 mL via EPIDURAL

## 2021-06-29 MED ORDER — DIPHENHYDRAMINE HCL 25 MG PO CAPS: 25.0000 mg | ORAL_CAPSULE | Freq: Four times a day (QID) | ORAL | Status: DC | PRN

## 2021-06-29 MED ORDER — LABETALOL HCL 5 MG/ML IV SOLN
INTRAVENOUS | Status: AC
  Filled 2021-06-29: qty 4

## 2021-06-29 MED ORDER — FENTANYL CITRATE (PF) 100 MCG/2ML IJ SOLN
INTRAMUSCULAR | Status: DC | PRN
  Administered 2021-06-29: 100 ug via EPIDURAL

## 2021-06-29 MED ORDER — FENTANYL CITRATE (PF) 100 MCG/2ML IJ SOLN
INTRAMUSCULAR | Status: AC
  Filled 2021-06-29: qty 2

## 2021-06-29 MED ORDER — CEFAZOLIN IN SODIUM CHLORIDE 3-0.9 GM/100ML-% IV SOLN
3.0000 g | INTRAVENOUS | Status: AC
  Administered 2021-06-29: 3 g via INTRAVENOUS

## 2021-06-29 MED ORDER — FENTANYL CITRATE (PF) 100 MCG/2ML IJ SOLN
INTRAMUSCULAR | Status: DC | PRN
  Administered 2021-06-29: 50 ug via INTRAVENOUS

## 2021-06-29 MED ORDER — DEXMEDETOMIDINE (PRECEDEX) IN NS 20 MCG/5ML (4 MCG/ML) IV SYRINGE
PREFILLED_SYRINGE | INTRAVENOUS | Status: AC
  Filled 2021-06-29: qty 5

## 2021-06-29 MED ORDER — OXYTOCIN-SODIUM CHLORIDE 30-0.9 UT/500ML-% IV SOLN
INTRAVENOUS | Status: DC | PRN
  Administered 2021-06-29: 30 [IU] via INTRAVENOUS

## 2021-06-29 MED ORDER — WITCH HAZEL-GLYCERIN EX PADS: 1.0000 "application " | MEDICATED_PAD | CUTANEOUS | Status: DC | PRN

## 2021-06-29 MED ORDER — KETOROLAC TROMETHAMINE 30 MG/ML IJ SOLN
INTRAMUSCULAR | Status: AC
  Filled 2021-06-29: qty 1

## 2021-06-29 MED ORDER — OXYCODONE HCL 5 MG PO TABS: 5.0000 mg | ORAL_TABLET | ORAL | Status: DC | PRN

## 2021-06-29 MED ORDER — SENNOSIDES-DOCUSATE SODIUM 8.6-50 MG PO TABS
2.0000 | ORAL_TABLET | Freq: Every day | ORAL | Status: DC
  Administered 2021-06-30 – 2021-07-01 (×2): 2 via ORAL
  Filled 2021-06-29 (×3): qty 2

## 2021-06-29 MED ORDER — LACTATED RINGERS IV BOLUS
500.0000 mL | Freq: Once | INTRAVENOUS | Status: AC
  Administered 2021-06-29: 500 mL via INTRAVENOUS

## 2021-06-29 MED ORDER — LACTATED RINGERS IV SOLN: INTRAVENOUS | Status: DC

## 2021-06-29 MED ORDER — SODIUM CHLORIDE 0.9 % IV SOLN
500.0000 mg | INTRAVENOUS | Status: AC
  Administered 2021-06-29: 500 mg via INTRAVENOUS

## 2021-06-29 MED ORDER — MORPHINE SULFATE (PF) 0.5 MG/ML IJ SOLN
INTRAMUSCULAR | Status: AC
  Filled 2021-06-29: qty 10

## 2021-06-29 MED ORDER — ONDANSETRON HCL 4 MG/2ML IJ SOLN
INTRAMUSCULAR | Status: DC | PRN
Start: 2021-06-29 — End: 2021-06-29
  Administered 2021-06-29: 4 mg via INTRAVENOUS

## 2021-06-29 SURGICAL SUPPLY — 35 items
BENZOIN TINCTURE PRP APPL 2/3 (GAUZE/BANDAGES/DRESSINGS) ×1 IMPLANT
CHLORAPREP W/TINT 26ML (MISCELLANEOUS) ×4 IMPLANT
CLAMP CORD UMBIL (MISCELLANEOUS) ×2 IMPLANT
CLOSURE STERI STRIP 1/2 X4 (GAUZE/BANDAGES/DRESSINGS) ×1 IMPLANT
CLOTH BEACON ORANGE TIMEOUT ST (SAFETY) ×2 IMPLANT
DRSG OPSITE POSTOP 4X10 (GAUZE/BANDAGES/DRESSINGS) ×2 IMPLANT
ELECT REM PT RETURN 9FT ADLT (ELECTROSURGICAL) ×2
ELECTRODE REM PT RTRN 9FT ADLT (ELECTROSURGICAL) ×1 IMPLANT
EXTRACTOR VACUUM KIWI (MISCELLANEOUS) IMPLANT
GAUZE SPONGE 4X4 12PLY STRL LF (GAUZE/BANDAGES/DRESSINGS) ×2 IMPLANT
GLOVE BIO SURGEON STRL SZ 6 (GLOVE) ×2 IMPLANT
GLOVE BIOGEL PI IND STRL 7.0 (GLOVE) ×2 IMPLANT
GLOVE BIOGEL PI INDICATOR 7.0 (GLOVE) ×2
GOWN STRL REUS W/TWL LRG LVL3 (GOWN DISPOSABLE) ×4 IMPLANT
KIT ABG SYR 3ML LUER SLIP (SYRINGE) IMPLANT
NDL HYPO 25X5/8 SAFETYGLIDE (NEEDLE) IMPLANT
NEEDLE HYPO 22GX1.5 SAFETY (NEEDLE) IMPLANT
NEEDLE HYPO 25X5/8 SAFETYGLIDE (NEEDLE) IMPLANT
NS IRRIG 1000ML POUR BTL (IV SOLUTION) ×2 IMPLANT
PACK C SECTION WH (CUSTOM PROCEDURE TRAY) ×2 IMPLANT
PAD ABD 7.5X8 STRL (GAUZE/BANDAGES/DRESSINGS) ×1 IMPLANT
PAD OB MATERNITY 4.3X12.25 (PERSONAL CARE ITEMS) ×2 IMPLANT
RETRACTOR WND ALEXIS 25 LRG (MISCELLANEOUS) IMPLANT
RTRCTR WOUND ALEXIS 25CM LRG (MISCELLANEOUS) ×2
SUT MON AB 4-0 PS1 27 (SUTURE) ×2 IMPLANT
SUT PLAIN 0 NONE (SUTURE) ×2 IMPLANT
SUT PLAIN 2 0 (SUTURE) ×1
SUT PLAIN ABS 2-0 CT1 27XMFL (SUTURE) IMPLANT
SUT VIC AB 0 CT1 36 (SUTURE) ×6 IMPLANT
SUT VIC AB 2-0 CT1 27 (SUTURE) ×1
SUT VIC AB 2-0 CT1 TAPERPNT 27 (SUTURE) ×1 IMPLANT
SYR CONTROL 10ML LL (SYRINGE) IMPLANT
TOWEL OR 17X24 6PK STRL BLUE (TOWEL DISPOSABLE) ×2 IMPLANT
TRAY FOLEY W/BAG SLVR 14FR LF (SET/KITS/TRAYS/PACK) IMPLANT
WATER STERILE IRR 1000ML POUR (IV SOLUTION) ×3 IMPLANT

## 2021-06-29 NOTE — Transfer of Care (Signed)
Immediate Anesthesia Transfer of Care Note ? ?Patient: Jodi Roberts ? ?Procedure(s) Performed: CESAREAN SECTION ? ?Patient Location: PACU ? ?Anesthesia Type:Epidural ? ?Level of Consciousness: awake, alert  and oriented ? ?Airway & Oxygen Therapy: Patient Spontanous Breathing ? ?Post-op Assessment: Report given to RN and Post -op Vital signs reviewed and stable ? ?Post vital signs: Reviewed and stable ? ?Last Vitals:  ?Vitals Value Taken Time  ?BP 154/95 06/29/21 0405  ?Temp 37.9 ?C 06/29/21 0405  ?Pulse 117 06/29/21 0414  ?Resp 21 06/29/21 0414  ?SpO2 99 % 06/29/21 0414  ?Vitals shown include unvalidated device data. ? ?Last Pain:  ?Vitals:  ? 06/29/21 0405  ?TempSrc:   ?PainSc: 6   ?   ? ?  ? ?Complications: No notable events documented. ?

## 2021-06-29 NOTE — Op Note (Signed)
Jodi Roberts ? ?PROCEDURE DATE: 06/29/2021 ? ?PREOPERATIVE DIAGNOSES: Intrauterine pregnancy at [redacted]w[redacted]d weeks gestation; pre-eclampsia without severe features; gestational diabetes; failure to progress: arrest of dilation ? ?POSTOPERATIVE DIAGNOSES: The same ? ?PROCEDURE: Primary Low Transverse Cesarean Section ? ?SURGEON:  Dr. Milas Hock ? ?ASSISTANT:  Dr. Evalina Field ? ?An experienced assistant was required given the standard of surgical care given the complexity of the case.  This assistant was needed for exposure, dissection, suctioning, retraction, instrument exchange, assisting with delivery with administration of fundal pressure, and for overall help during the procedure. ? ?ANESTHESIOLOGY TEAM: Anesthesiologist: Lannie Fields, DO; Elmer Picker, MD ? ?INDICATIONS: Jodi Roberts is a 31 y.o. G2P0010 at [redacted]w[redacted]d here for cesarean section secondary to the indications listed under preoperative diagnoses; please see preoperative note for further details.  The risks of cesarean section were discussed with the patient including but were not limited to: bleeding which may require transfusion or reoperation; infection which may require antibiotics; injury to bowel, bladder, ureters or other surrounding organs; injury to the fetus; need for additional procedures including hysterectomy in the event of a life-threatening hemorrhage; placental abnormalities wth subsequent pregnancies, incisional problems, thromboembolic phenomenon and other postoperative/anesthesia complications.   The patient concurred with the proposed plan, giving informed written consent for the procedure.   ? ?FINDINGS:  Viable female infant in cephalic presentation.  Apgars 9 and 9.  Clear amniotic fluid.  Intact placenta, three vessel cord.  Normal uterus.  ? ?ANESTHESIA: Epidural  ?INTRAVENOUS FLUIDS: 700 ml   ?ESTIMATED BLOOD LOSS: 487 ml ?URINE OUTPUT:  100 ml ?SPECIMENS: Placenta sent to L&D ?COMPLICATIONS: None  immediate ? ?PROCEDURE IN DETAIL:   ?The patient preoperatively received intravenous antibiotics and had sequential compression devices applied to her lower extremities.  She was then taken to the operating room where the epidural anesthesia was dosed up to surgical level and was found to be adequate. She was then placed in a dorsal supine position with a leftward tilt, and prepped and draped in a sterile manner.  A foley catheter was in place attached to constant gravity.   ? ?After an adequate timeout was performed, a Pfannenstiel skin incision was made with a scalpel and carried through to the underlying layer of fascia.  The fascia was incised in the midline, and this incision was extended bilaterally bluntly.  The rectus muscles were separated in the midline and the peritoneum was entered bluntly.  The Alexis self-retaining retractor was introduced into the abdominal cavity.   ? ?Attention was turned to the lower uterine segment where a low transverse hysterotomy was made with a scalpel and extended bilaterally bluntly.  The infant was successfully delivered, the cord was clamped and cut after one minute, and the infant was handed over to the awaiting neonatology team. Uterine massage was then administered, and the placenta delivered intact with a three-vessel cord. The uterus was then cleared of clots and debris.   ? ?The hysterotomy was closed with 0 Vicryl in a running locked fashion.  The pelvis was cleared of all clot and debris. Hemostasis was confirmed on all surfaces.  The retractor was removed.   ? ?The peritoneum was closed with a 2-0 Vicryl running stitch.  The fascia was then closed using 0 Vicryl in a running fashion.  The subcutaneous layer was irrigated, re-approximated with a 2-0 plain gut running stitch, and the skin was closed with a 4-0 Monocryl subcuticular stitch. The patient tolerated the procedure well. Sponge, instrument and needle counts  were correct x 3.  She was taken to the recovery  room in stable condition.  ? ?Evalina Field, MD ?[facility] Fellow  Faculty Practice  ? ?

## 2021-06-29 NOTE — Progress Notes (Signed)
Post Operative Day 1  Subjective: Doing well. No acute events overnight. Pain is controlled and bleeding is appropriate. She is eating, drinking, and ambulating without issue. She is passing flatus. Her foley catheter remains in place. She is formula feeding which is going well. She has no other concerns at this time.  Objective: Blood pressure 110/78, pulse 90, temperature 98.2 F (36.8 C), resp. rate 16, height 5\' 6"  (1.676 m), weight 122 kg, last menstrual period 10/10/2020, SpO2 97 %, unknown if currently breastfeeding.  Physical Exam:  General: alert, cooperative, and no distress Lochia: appropriate Uterine Fundus: firm and below umbilicus  Incision: pressure dressing in place, clean, dry, and intact DVT Evaluation: no LE edema or calf tenderness to palpation   Recent Labs    06/29/21 0442 06/30/21 0447  HGB 9.3* 8.6*  HCT 29.1* 26.6*    Assessment/Plan: Jodi Roberts is a 31 y.o. G2P1011 on POD# 1 s/p pLTCS.  Progressing well. Meeting postpartum milestones. VSS. Continue routine postpartum care.  Plan to remove foley catheter this AM. Will remove pressure dressing today as well.   #Pre-eclampsia without severe features: - BP improved since delivery, now within normal limits - Plan to start Lasix course today - Will continue to monitor   #[name]: - Fasting glucose 99 - Plan for GTT at 6 week postpartum visit   #Acute blood loss anemia: - Hgb 9.9 > 8.6 post-operatively  - No symptoms - Will give IV Venofer today  Feeding: Bottle (formula)  Contraception: Unsure, counseled at bedside   Dispo: Plan for discharge on POD#2-3.    LOS: 3 days   Genia Del, MD  06/30/2021, 9:09 AM

## 2021-06-29 NOTE — Discharge Summary (Addendum)
Postpartum Discharge Summary     Patient Name: Jodi Roberts DOB: [birth year] MRN: [id number]  Date of admission: 06/27/2021 Delivery date:06/29/2021  Delivering provider: Radene Gunning  Date of discharge: 07/01/2021  Admitting diagnosis: Pregnancy [Z34.90] Intrauterine pregnancy: [redacted]w[redacted]d    Secondary diagnosis:  Principal Problem:   S/P cesarean section Active Problems:   Supervision of high-risk pregnancy, third trimester   Gestational diabetes mellitus (GDM) in third trimester   Macrosomia of fetus affecting management of mother, antepartum, third trimester   Maternal morbid obesity, antepartum ([place]   Preeclampsia, third trimester  Additional problems: Blood loss anemia     Discharge diagnosis: Term Pregnancy Delivered                                              Post partum procedures: None Augmentation: Pitocin, Cytotec, and IP Foley Complications: None  Hospital course: Induction of Labor With Cesarean Section   31y.o. yo G2P0010 at 364w3das admitted to the hospital 06/27/2021 for induction of labor due to pre-eclampsia without severe features. Patient received Cytotec, foley balloon placement, and Pitocin followed by [facility]. She progressed to 5 cm and did not have any further progression for over 12 hours despite adequate contractions for over 6 hours and being ruptured for 22 hours. The patient went for cesarean section due to Arrest of Dilation. Delivery details are as follows: Membrane Rupture Time/Date: 3:00 AM ,06/28/2021   Delivery Method:C-Section, Low Transverse  Details of operation can be found in separate operative note.  Patient had an uncomplicated postpartum course.  Her hemoglobin on POD#1 was 8.6, for which she received Venofer.  Her BP was in the 120s-130s/80s-90s.  She was started on Lasix, which she will continue upon discharge to complete a 5 day course and based on her blood pressures she was started on procardia 3039maily.  Her fasting CBG postpartum was 99.   She is ambulating, tolerating a regular diet, passing flatus, and urinating well.  Her pain and bleeding are controlled.  Patient is discharged home in stable condition on 07/01/21.      Newborn Data: Birth date:06/29/2021  Birth time:3:15 AM  Gender:Female  Living status:Living  Apgars:9 ,9  Weight:3530 g                                Magnesium Sulfate received: No BMZ received: No Rhophylac: N/A MMR: N/A T-DaP: Given prenatally Flu: No Transfusion: Yes - IV Iron  Physical exam  Vitals:   06/30/21 0615 06/30/21 1237 06/30/21 2121 07/01/21 0500  BP: 110/78 130/80 138/90 128/89  Pulse: 90 91 99 98  Resp: _0 Temp: 98.2 F (36.8 C) 98.1 F (36.7 C) 98.1 F (36.7 C) 98.5 F (36.9 C)  TempSrc:  Oral Oral Oral  SpO2:   99% 100%  Weight:      Height:       General: alert Lochia: appropriate Uterine Fundus: firm Incision: Dressing is clean, dry, and intact DVT Evaluation: No evidence of DVT seen on physical exam.  Labs: Lab Results  Component Value Date   WBC 12.3 (H) 06/30/2021   HGB 8.6 (L) 06/30/2021   HCT 26.6 (L) 06/30/2021   MCV 92.0 06/30/2021   PLT 192 06/30/2021      Latest Ref Rng &  Units 06/30/2021    4:47 AM  CMP  Glucose 70 - 99 mg/dL 109    BUN 6 - 20 mg/dL 20    Creatinine 0.44 - 1.00 mg/dL 1.11    Sodium 135 - 145 mmol/L 135    Potassium 3.5 - 5.1 mmol/L 3.7    Chloride 98 - 111 mmol/L 108    CO2 22 - 32 mmol/L 21    Calcium 8.9 - 10.3 mg/dL 8.4    Total Protein 6.5 - 8.1 g/dL 5.9    Total Bilirubin 0.3 - 1.2 mg/dL 0.3    Alkaline Phos 38 - 126 U/L 100    AST 15 - 41 U/L 22    ALT 0 - 44 U/L 17     Edinburgh Score:    06/29/2021    5:10 AM  Edinburgh Postnatal Depression Scale Screening Tool  I have been able to laugh and see the funny side of things. 0  I have looked forward with enjoyment to things. 0  I have blamed myself unnecessarily when things went wrong. 1  I have been anxious or worried for no good reason. 0  I have  felt scared or panicky for no good reason. 0  Things have been getting on top of me. 0  I have been so unhappy that I have had difficulty sleeping. 0  I have felt sad or miserable. 0  I have been so unhappy that I have been crying. 0  The thought of harming myself has occurred to me. 0  Edinburgh Postnatal Depression Scale Total 1     After visit meds:  Allergies as of 07/01/2021       Reactions   Shellfish Allergy Anaphylaxis, Swelling   Throat closes Only with eating.  Ok to touch iodine or betadine.   Sulfa Antibiotics Anaphylaxis        Medication List     STOP taking these medications    Accu-Chek Guide test strip Generic drug: glucose blood   Accu-Chek Guide w/Device Kit   Accu-Chek Softclix Lancets lancets   aspirin 81 MG chewable tablet   aspirin EC 81 MG tablet   insulin detemir 100 UNIT/ML FlexPen Commonly known as: LEVEMIR   Insulin Pen Needle 33G X 4 MM Misc   metFORMIN 1000 MG tablet Commonly known as: GLUCOPHAGE   prenatal vitamin w/FE, FA 29-1 MG Chew chewable tablet       TAKE these medications    acetaminophen 500 MG tablet Commonly known as: TYLENOL Take 2 tablets (1,000 mg total) by mouth every 6 (six) hours.   Folivane-OB 85-1 MG Caps Take 1 capsule by mouth daily.   furosemide 20 MG tablet Commonly known as: LASIX Take 1 tablet (20 mg total) by mouth daily for 5 days.   ibuprofen 600 MG tablet Commonly known as: ADVIL Take 1 tablet (600 mg total) by mouth every 6 (six) hours.   NIFEdipine 30 MG 24 hr tablet Commonly known as: ADALAT CC Take 1 tablet (30 mg total) by mouth daily.   oxyCODONE 5 MG immediate release tablet Commonly known as: Oxy IR/ROXICODONE Take 1 tablet (5 mg total) by mouth every 6 (six) hours as needed for severe pain.         Discharge home in stable condition Infant Feeding: Bottle Infant Disposition: home with mother Discharge instruction: per After Visit Summary and Postpartum  booklet. Activity: Advance as tolerated. Pelvic rest for 6 weeks.  Diet: routine diet Future Appointments: Future Appointments  Date Time Provider Lorenzo  07/06/2021  9:55 AM Truett Mainland, DO [facility] None  08/09/2021  8:35 AM Seabron Spates, CNM [facility] None   Follow up Visit: Message sent to [facility] by Dr. Gwenlyn Perking on 06/29/21.   Please schedule this patient for a In person postpartum visit in 6 weeks with the following provider: Any provider. Additional Postpartum F/U: 2 hour GTT, Incision check 1 week, and BP check 1 week  High risk pregnancy complicated by: GDM and pre-eclampsia Delivery mode:  C-Section, Low Transverse  Anticipated Birth Control:  outpatient Nexplanon  Renard Matter, MD, MPH OB Fellow, Faculty Practice

## 2021-06-29 NOTE — Anesthesia Postprocedure Evaluation (Signed)
Anesthesia Post Note ? ?Patient: Jodi Roberts ? ?Procedure(s) Performed: CESAREAN SECTION ? ?  ? ?Patient location during evaluation: PACU ?Anesthesia Type: Epidural ?Level of consciousness: awake and alert and oriented ?Pain management: pain level controlled ?Vital Signs Assessment: post-procedure vital signs reviewed and stable ?Respiratory status: spontaneous breathing, nonlabored ventilation and respiratory function stable ?Cardiovascular status: blood pressure returned to baseline and stable ?Postop Assessment: no headache, no backache, patient able to bend at knees, epidural receding and no apparent nausea or vomiting ?Anesthetic complications: no ?Comments: HTN treated w/ IV labetalol w/ good response  ? ? ?No notable events documented. ? ?Last Vitals:  ?Vitals:  ? 06/29/21 0415 06/29/21 0430  ?BP: (!) 168/95 138/81  ?Pulse: (!) 117 (!) 117  ?Resp: (!) 33 (!) 21  ?Temp:    ?SpO2: 97% 98%  ?  ?Last Pain:  ?Vitals:  ? 06/29/21 0430  ?TempSrc:   ?PainSc: 6   ? ?Pain Goal:   ? ?LLE Motor Response: Purposeful movement (06/29/21 0415) ?LLE Sensation: Full sensation (06/29/21 0415) ?RLE Motor Response: Purposeful movement (06/29/21 0415) ?RLE Sensation: Full sensation (06/29/21 0415) ?  ?  ?Epidural/Spinal Function Cutaneous sensation: Able to Discern Pressure (06/29/21 0430), Patient able to flex knees: Yes (06/29/21 0430), Patient able to lift hips off bed: Yes (06/29/21 0430), Back pain beyond tenderness at insertion site: No (06/29/21 0430), Progressively worsening motor and/or sensory loss: No (06/29/21 0430), Bowel and/or bladder incontinence post epidural: No (06/29/21 0430) ? ?Tennis Must Heide Brossart ? ? ? ? ?

## 2021-06-29 NOTE — Progress Notes (Signed)
Labor Progress Note ?Jodi Roberts is a 31 y.o. G2P0010 at [redacted]w[redacted]d who presented for IOL due to pre-eclampsia without severe features. ? ?S: Doing well. Understandably tired from her labor course. No concerns at this time.  ? ?O:  ?BP 138/84   Pulse (!) 104   Temp 98.3 ?F (36.8 ?C) (Oral)   Resp 18   Ht 5\' 6"  (1.676 m)   Wt 122 kg   LMP 10/10/2020   BMI 43.41 kg/m?  ? ?EFM: Baseline 155 bpm, moderate variability, + accels, no decels  ?Toco: Every 3 minutes, MVUs adequate  ? ?CVE: Dilation: 5 ?Effacement (%): 60 ?Station: 0 ?Presentation: Vertex ?Exam by:: Dr. [email or web address] ? ?A&P: 31 y.o. G2P0010 [redacted]w[redacted]d  ? ?#Labor: SVE unchanged; cervix very edematous. Patient has been ~5 cm for over 12 hours and ruptured since 5/16 at 0300. She has had an adequate contraction pattern for the last 6 hours without cervical change. No signs of triple I, reassuring FHT. Discussed option to proceed with cesarean section at this time versus continuing to try for a vaginal delivery as long as fetus status remains reassuring. Patient elected to proceed with cesarean section. Pitocin discontinued.  ? ?The risks of surgery were discussed with the patient including but were not limited to: bleeding which may require transfusion or reoperation; infection which may require antibiotics; injury to bowel, bladder, ureters or other surrounding organs; injury to the fetus; need for additional procedures including hysterectomy in the event of a life-threatening hemorrhage; formation of adhesions; placental abnormalities with subsequent pregnancies; incisional problems; thromboembolic phenomenon and other postoperative/anesthesia complications.  The patient concurred with the proposed plan, giving informed written consent for the procedure.   Patient has been on a clear liquid diet and will remain NPO for procedure.  Anesthesia and OR aware. Preoperative prophylactic antibiotics and SCDs ordered on call to the OR.  To OR when ready.  ? ?#Pain: Epidural   ?#FWB: Cat 1  ?#GBS negative, Ancef and Azithro ordered for surgical prophylaxis  ? ?#Pre-eclampsia without severe features: BP remains normal to mild range. Will continue to monitor postpartum. Plan for Lasix course and antihypertensives per protocol.  ? ?6/16, MD ?1:07 AM ? ?

## 2021-06-30 ENCOUNTER — Encounter: Payer: BC Managed Care – PPO | Admitting: Family Medicine

## 2021-06-30 LAB — CBC
HCT: 26.6 % — ABNORMAL LOW (ref 36.0–46.0)
Hemoglobin: 8.6 g/dL — ABNORMAL LOW (ref 12.0–15.0)
MCH: 29.8 pg (ref 26.0–34.0)
MCHC: 32.3 g/dL (ref 30.0–36.0)
MCV: 92 fL (ref 80.0–100.0)
Platelets: 192 10*3/uL (ref 150–400)
RBC: 2.89 MIL/uL — ABNORMAL LOW (ref 3.87–5.11)
RDW: 16.1 % — ABNORMAL HIGH (ref 11.5–15.5)
WBC: 12.3 10*3/uL — ABNORMAL HIGH (ref 4.0–10.5)
nRBC: 0 % (ref 0.0–0.2)

## 2021-06-30 LAB — COMPREHENSIVE METABOLIC PANEL
ALT: 17 U/L (ref 0–44)
AST: 22 U/L (ref 15–41)
Albumin: 2.1 g/dL — ABNORMAL LOW (ref 3.5–5.0)
Alkaline Phosphatase: 100 U/L (ref 38–126)
Anion gap: 6 (ref 5–15)
BUN: 20 mg/dL (ref 6–20)
CO2: 21 mmol/L — ABNORMAL LOW (ref 22–32)
Calcium: 8.4 mg/dL — ABNORMAL LOW (ref 8.9–10.3)
Chloride: 108 mmol/L (ref 98–111)
Creatinine, Ser: 1.11 mg/dL — ABNORMAL HIGH (ref 0.44–1.00)
GFR, Estimated: >60 mL/min (ref >60)
Glucose, Bld: 109 mg/dL — ABNORMAL HIGH (ref 70–99)
Potassium: 3.7 mmol/L (ref 3.5–5.1)
Sodium: 135 mmol/L (ref 135–145)
Total Bilirubin: 0.3 mg/dL (ref 0.3–1.2)
Total Protein: 5.9 g/dL — ABNORMAL LOW (ref 6.5–8.1)

## 2021-06-30 LAB — GLUCOSE, CAPILLARY: Glucose-Capillary: 99 mg/dL (ref 70–99)

## 2021-06-30 MED ORDER — NIFEDIPINE ER OSMOTIC RELEASE 30 MG PO TB24
30.0000 mg | ORAL_TABLET | Freq: Every day | ORAL | Status: DC
  Administered 2021-07-01: 30 mg via ORAL
  Filled 2021-06-30: qty 1

## 2021-06-30 MED ORDER — SODIUM CHLORIDE 0.9 % IV SOLN
500.0000 mg | Freq: Once | INTRAVENOUS | Status: AC
  Administered 2021-06-30: 500 mg via INTRAVENOUS
  Filled 2021-06-30: qty 25

## 2021-07-01 ENCOUNTER — Other Ambulatory Visit (HOSPITAL_COMMUNITY): Payer: Self-pay

## 2021-07-01 MED ORDER — OXYCODONE HCL 5 MG PO TABS
5.0000 mg | ORAL_TABLET | Freq: Four times a day (QID) | ORAL | 0 refills | Status: DC | PRN
  Filled 2021-07-01: qty 10, 3d supply, fill #0

## 2021-07-01 MED ORDER — FUROSEMIDE 20 MG PO TABS
20.0000 mg | ORAL_TABLET | Freq: Every day | ORAL | 0 refills | Status: DC
Start: 2021-07-01 — End: 2021-10-30
  Filled 2021-07-01: qty 3, 3d supply, fill #0

## 2021-07-01 MED ORDER — IBUPROFEN 600 MG PO TABS
600.0000 mg | ORAL_TABLET | Freq: Four times a day (QID) | ORAL | 0 refills | Status: DC
Start: 2021-07-01 — End: 2021-10-30
  Filled 2021-07-01: qty 30, 8d supply, fill #0

## 2021-07-01 MED ORDER — NIFEDIPINE ER 30 MG PO TB24
30.0000 mg | ORAL_TABLET | Freq: Every day | ORAL | 0 refills | Status: DC
  Filled 2021-07-01: qty 30, 30d supply, fill #0

## 2021-07-01 MED ORDER — ACETAMINOPHEN 500 MG PO TABS
1000.0000 mg | ORAL_TABLET | Freq: Four times a day (QID) | ORAL | 0 refills | Status: DC
  Filled 2021-07-01: qty 30, 4d supply, fill #0

## 2021-07-05 ENCOUNTER — Encounter: Payer: BC Managed Care – PPO | Admitting: Advanced Practice Midwife

## 2021-07-06 ENCOUNTER — Ambulatory Visit: Payer: BC Managed Care – PPO

## 2021-07-06 ENCOUNTER — Encounter: Payer: Self-pay | Admitting: Family Medicine

## 2021-07-06 ENCOUNTER — Ambulatory Visit (INDEPENDENT_AMBULATORY_CARE_PROVIDER_SITE_OTHER): Payer: BC Managed Care – PPO | Admitting: Family Medicine

## 2021-07-06 ENCOUNTER — Encounter: Payer: Self-pay | Admitting: General Practice

## 2021-07-06 VITALS — BP 142/97 | HR 91 | Ht 66.0 in | Wt 255.0 lb

## 2021-07-06 DIAGNOSIS — O1493 Unspecified pre-eclampsia, third trimester: Secondary | ICD-10-CM

## 2021-07-06 DIAGNOSIS — Z4889 Encounter for other specified surgical aftercare: Secondary | ICD-10-CM

## 2021-07-06 NOTE — Progress Notes (Signed)
   Subjective:    Patient ID: Jodi Roberts, female    DOB: 04/14/1990, 31 y.o.   MRN: [id number]  HPI Patient seen 1 week s/p PTLCS for arrest of dilation. She is doing well. Did not take BP medication this morning. Finished lasix.   Review of Systems     Objective:  BP (!) 142/97   Pulse 91   Ht 5\' 6"  (1.676 m)   Wt 255 lb (115.7 kg)   LMP 10/10/2020   Breastfeeding No   BMI 41.16 kg/m    Physical Exam Vitals reviewed.  Constitutional:      Appearance: Normal appearance.  Abdominal:     General: Abdomen is flat.     Palpations: Abdomen is soft.     Comments: Well-healing Pfannenstiel incision that is clean, dry, intact.  Skin:    General: Skin is warm and dry.     Capillary Refill: Capillary refill takes less than 2 seconds.  Neurological:     General: No focal deficit present.     Mental Status: She is alert.  Psychiatric:        Mood and Affect: Mood normal.        Behavior: Behavior normal.        Thought Content: Thought content normal.          Assessment & Plan:  1. Encounter for post surgical wound check Doing well. No concerns  2. Preeclampsia, third trimester Continue BP medication.

## 2021-07-06 NOTE — Progress Notes (Signed)
Patient is one week postpartum. Patient present for bp check and incision check. Armandina Stammer RN

## 2021-07-07 ENCOUNTER — Encounter: Payer: BC Managed Care – PPO | Admitting: Family Medicine

## 2021-07-12 ENCOUNTER — Encounter: Payer: BC Managed Care – PPO | Admitting: Advanced Practice Midwife

## 2021-07-14 ENCOUNTER — Encounter: Payer: BC Managed Care – PPO | Admitting: Obstetrics & Gynecology

## 2021-07-21 ENCOUNTER — Encounter: Payer: BC Managed Care – PPO | Admitting: Family Medicine

## 2021-07-26 ENCOUNTER — Ambulatory Visit: Payer: BC Managed Care – PPO | Admitting: Advanced Practice Midwife

## 2021-08-09 ENCOUNTER — Ambulatory Visit (INDEPENDENT_AMBULATORY_CARE_PROVIDER_SITE_OTHER): Payer: BC Managed Care – PPO | Admitting: Advanced Practice Midwife

## 2021-08-09 ENCOUNTER — Encounter: Payer: Self-pay | Admitting: Advanced Practice Midwife

## 2021-08-09 VITALS — BP 113/77 | HR 80 | Wt 239.0 lb

## 2021-08-09 DIAGNOSIS — Z8632 Personal history of gestational diabetes: Secondary | ICD-10-CM | POA: Diagnosis not present

## 2021-08-09 DIAGNOSIS — Z30013 Encounter for initial prescription of injectable contraceptive: Secondary | ICD-10-CM

## 2021-08-09 DIAGNOSIS — Z98891 History of uterine scar from previous surgery: Secondary | ICD-10-CM | POA: Diagnosis not present

## 2021-08-09 LAB — POCT URINE PREGNANCY: Preg Test, Ur: NEGATIVE

## 2021-08-09 MED ORDER — MEDROXYPROGESTERONE ACETATE 150 MG/ML IM SUSP
150.0000 mg | Freq: Once | INTRAMUSCULAR | Status: AC
  Administered 2021-08-09: 150 mg via INTRAMUSCULAR

## 2021-08-09 MED ORDER — MEDROXYPROGESTERONE ACETATE 150 MG/ML IM SUSP: 150.0000 mg | INTRAMUSCULAR | 5 refills | Status: DC

## 2021-08-09 NOTE — Progress Notes (Signed)
    Post Partum Visit Note  Jodi Roberts is a 31 y.o. G23P1011 female who presents for a postpartum visit. She is 5 weeks postpartum following a primary cesarean section.  I have fully reviewed the prenatal and intrapartum course. The delivery was at 37 gestational weeks.  Anesthesia: epidural. Postpartum course has been uneventful. Jodi Roberts is doing well. Baby is feeding by bottle - Similac Pro Total Comfort  . Bleeding no bleeding. Bowel function is normal. Bladder function is normal. Patient is not sexually active. Contraception method discussed.  Had been interested in Nexplanon but when we discussed side effects and possibility of unscheduled bleeding, she elects to use DepoProvera which she has used in past.   Postpartum depression screening: negative.   The pregnancy intention screening data noted above was reviewed. Potential methods of contraception were discussed. The patient elected to proceed with No data recorded.    Health Maintenance Due  Topic Date Due   COVID-19 Vaccine (1) Never done   URINE MICROALBUMIN  Never done    The following portions of the patient's history were reviewed and updated as appropriate: allergies, current medications, past family history, past medical history, past social history, past surgical history, and problem list.  Review of Systems Pertinent items noted in HPI and remainder of comprehensive ROS otherwise negative.  Objective:  LMP 10/10/2020    General:  alert and no distress   Breasts:  normal  Lungs: No problems with respiration  Heart:  Normal rate  Abdomen: soft, non-tender; bowel sounds normal; no masses,  no organomegaly   Wound well approximated incision  GU exam:  not indicated       Assessment:    There are no diagnoses linked to this encounter.  Normal  postpartum exam.   Plan:   Essential components of care per ACOG recommendations:  1.  Mood and well being: Patient with negative depression screening today. Reviewed  local resources for support.  - Patient tobacco use? No.   - hx of drug use? No.    2. Infant care and feeding:  -Patient currently breastmilk feeding? No.  -Social determinants of health (SDOH) reviewed in EPIC. No concerns 3. Sexuality, contraception and birth spacing - Patient does not want a pregnancy in the next year.  - Reviewed reproductive life planning. Reviewed contraceptive methods based on pt preferences and effectiveness.  Patient desired Hormonal Injection today.   - Discussed birth spacing of 18 months  4. Sleep and fatigue -Encouraged family/partner/community support of 4 hrs of uninterrupted sleep to help with mood and fatigue  5. Physical Recovery  - Discussed patients delivery and complications. She describes her labor as good. - Patient had a C-section.  - Patient has urinary incontinence? No. - Patient is safe to resume physical and sexual activity  6.  Health Maintenance - HM due items addressed No -   - Last pap smear No results found for: "DIAGPAP" Pap smear not done at today's visit.  -Breast Cancer screening indicated? No.   7. Chronic Disease/Pregnancy Condition follow up: None  - PCP follow up  chiquita l wilson, CMA [facility] [employer], [facility]   Aviva Signs, [place]

## 2021-08-10 LAB — GLUCOSE TOLERANCE, 2 HOURS
Glucose, 2 hour: 90 mg/dL (ref 70–139)
Glucose, GTT - Fasting: 86 mg/dL (ref 70–99)

## 2021-10-30 ENCOUNTER — Emergency Department (HOSPITAL_BASED_OUTPATIENT_CLINIC_OR_DEPARTMENT_OTHER): Payer: BC Managed Care – PPO

## 2021-10-30 ENCOUNTER — Encounter (HOSPITAL_BASED_OUTPATIENT_CLINIC_OR_DEPARTMENT_OTHER): Payer: Self-pay | Admitting: Emergency Medicine

## 2021-10-30 ENCOUNTER — Observation Stay (HOSPITAL_BASED_OUTPATIENT_CLINIC_OR_DEPARTMENT_OTHER)
Admission: EM | Admit: 2021-10-30 | Discharge: 2021-11-01 | Disposition: A | Discharge status: Home / Self Care | Payer: BC Managed Care – PPO | Attending: General Surgery | Admitting: General Surgery

## 2021-10-30 ENCOUNTER — Other Ambulatory Visit: Payer: Self-pay

## 2021-10-30 DIAGNOSIS — K8020 Calculus of gallbladder without cholecystitis without obstruction: Secondary | ICD-10-CM | POA: Diagnosis not present

## 2021-10-30 DIAGNOSIS — Z79899 Other long term (current) drug therapy: Secondary | ICD-10-CM | POA: Insufficient documentation

## 2021-10-30 DIAGNOSIS — K8012 Calculus of gallbladder with acute and chronic cholecystitis without obstruction: Secondary | ICD-10-CM | POA: Diagnosis not present

## 2021-10-30 DIAGNOSIS — R1011 Right upper quadrant pain: Secondary | ICD-10-CM

## 2021-10-30 DIAGNOSIS — R1013 Epigastric pain: Secondary | ICD-10-CM | POA: Diagnosis not present

## 2021-10-30 DIAGNOSIS — Z96652 Presence of left artificial knee joint: Secondary | ICD-10-CM | POA: Diagnosis not present

## 2021-10-30 DIAGNOSIS — K810 Acute cholecystitis: Secondary | ICD-10-CM | POA: Diagnosis present

## 2021-10-30 DIAGNOSIS — K8010 Calculus of gallbladder with chronic cholecystitis without obstruction: Secondary | ICD-10-CM | POA: Diagnosis not present

## 2021-10-30 DIAGNOSIS — K8000 Calculus of gallbladder with acute cholecystitis without obstruction: Secondary | ICD-10-CM | POA: Diagnosis present

## 2021-10-30 LAB — COMPREHENSIVE METABOLIC PANEL
ALT: 156 U/L — ABNORMAL HIGH (ref 0–44)
AST: 250 U/L — ABNORMAL HIGH (ref 15–41)
Albumin: 3.7 g/dL (ref 3.5–5.0)
Alkaline Phosphatase: 115 U/L (ref 38–126)
Anion gap: 8 (ref 5–15)
BUN: 11 mg/dL (ref 6–20)
CO2: 23 mmol/L (ref 22–32)
Calcium: 9.3 mg/dL (ref 8.9–10.3)
Chloride: 108 mmol/L (ref 98–111)
Creatinine, Ser: 0.76 mg/dL (ref 0.44–1.00)
GFR, Estimated: >60 mL/min (ref >60)
Glucose, Bld: 93 mg/dL (ref 70–99)
Potassium: 4.4 mmol/L (ref 3.5–5.1)
Sodium: 139 mmol/L (ref 135–145)
Total Bilirubin: 0.9 mg/dL (ref 0.3–1.2)
Total Protein: 8.3 g/dL — ABNORMAL HIGH (ref 6.5–8.1)

## 2021-10-30 LAB — URINALYSIS, MICROSCOPIC (REFLEX)

## 2021-10-30 LAB — CBC WITH DIFFERENTIAL/PLATELET
Abs Immature Granulocytes: 0.02 10*3/uL (ref 0.00–0.07)
Basophils Absolute: 0 10*3/uL (ref 0.0–0.1)
Basophils Relative: 0 %
Eosinophils Absolute: 0.1 10*3/uL (ref 0.0–0.5)
Eosinophils Relative: 1 %
HCT: 34.6 % — ABNORMAL LOW (ref 36.0–46.0)
Hemoglobin: 10.8 g/dL — ABNORMAL LOW (ref 12.0–15.0)
Immature Granulocytes: 0 %
Lymphocytes Relative: 26 %
Lymphs Abs: 1.8 10*3/uL (ref 0.7–4.0)
MCH: 27.4 pg (ref 26.0–34.0)
MCHC: 31.2 g/dL (ref 30.0–36.0)
MCV: 87.8 fL (ref 80.0–100.0)
Monocytes Absolute: 0.4 10*3/uL (ref 0.1–1.0)
Monocytes Relative: 6 %
Neutro Abs: 4.7 10*3/uL (ref 1.7–7.7)
Neutrophils Relative %: 67 %
Platelets: 297 10*3/uL (ref 150–400)
RBC: 3.94 MIL/uL (ref 3.87–5.11)
RDW: 14.6 % (ref 11.5–15.5)
WBC: 7.1 10*3/uL (ref 4.0–10.5)
nRBC: 0 % (ref 0.0–0.2)

## 2021-10-30 LAB — URINALYSIS, ROUTINE W REFLEX MICROSCOPIC
Bilirubin Urine: NEGATIVE
Glucose, UA: NEGATIVE mg/dL
Ketones, ur: NEGATIVE mg/dL
Leukocytes,Ua: NEGATIVE
Nitrite: NEGATIVE
Protein, ur: NEGATIVE mg/dL
Specific Gravity, Urine: 1.02 (ref 1.005–1.030)
pH: 5.5 (ref 5.0–8.0)

## 2021-10-30 LAB — LIPASE, BLOOD: Lipase: 25 U/L (ref 11–51)

## 2021-10-30 LAB — SURGICAL PCR SCREEN
MRSA, PCR: NEGATIVE
Staphylococcus aureus: NEGATIVE

## 2021-10-30 LAB — PREGNANCY, URINE: Preg Test, Ur: NEGATIVE

## 2021-10-30 MED ORDER — TRAMADOL HCL 50 MG PO TABS
50.0000 mg | ORAL_TABLET | Freq: Four times a day (QID) | ORAL | Status: DC | PRN
  Filled 2021-10-30: qty 1

## 2021-10-30 MED ORDER — ACETAMINOPHEN 325 MG PO TABS
650.0000 mg | ORAL_TABLET | Freq: Four times a day (QID) | ORAL | Status: DC | PRN
  Administered 2021-10-31: 650 mg via ORAL
  Filled 2021-10-30: qty 2

## 2021-10-30 MED ORDER — ONDANSETRON 4 MG PO TBDP: 4.0000 mg | ORAL_TABLET | Freq: Four times a day (QID) | ORAL | Status: DC | PRN

## 2021-10-30 MED ORDER — KCL IN DEXTROSE-NACL 20-5-0.45 MEQ/L-%-% IV SOLN
INTRAVENOUS | Status: DC
  Filled 2021-10-30 (×3): qty 1000

## 2021-10-30 MED ORDER — SODIUM CHLORIDE 0.9 % IV SOLN
2.0000 g | INTRAVENOUS | Status: DC
  Administered 2021-10-31 – 2021-11-01 (×2): 2 g via INTRAVENOUS
  Filled 2021-10-30 (×2): qty 20

## 2021-10-30 MED ORDER — ACETAMINOPHEN 650 MG RE SUPP: 650.0000 mg | Freq: Four times a day (QID) | RECTAL | Status: DC | PRN

## 2021-10-30 MED ORDER — SODIUM CHLORIDE 0.9 % IV SOLN
2.0000 g | Freq: Once | INTRAVENOUS | Status: AC
  Administered 2021-10-30: 2 g via INTRAVENOUS
  Filled 2021-10-30: qty 20

## 2021-10-30 MED ORDER — HYDROMORPHONE HCL 1 MG/ML IJ SOLN: 1.0000 mg | INTRAMUSCULAR | Status: DC | PRN

## 2021-10-30 MED ORDER — ONDANSETRON HCL 4 MG/2ML IJ SOLN
4.0000 mg | Freq: Four times a day (QID) | INTRAMUSCULAR | Status: DC | PRN
  Administered 2021-10-31: 4 mg via INTRAVENOUS
  Filled 2021-10-30: qty 2

## 2021-10-30 NOTE — ED Notes (Signed)
Report given to [facility] eta 71mins.

## 2021-10-30 NOTE — ED Triage Notes (Signed)
States over the last couple weeks she has been getting random pain to stomach, going to back. Usually occurs at night.

## 2021-10-30 NOTE — ED Provider Notes (Signed)
[facility] EMERGENCY DEPARTMENT Provider Note   CSN: [id number] Arrival date & time: 10/30/21  1004     History  Chief Complaint  Patient presents with   Abdominal Pain    Jodi Roberts is a 31 y.o. female.  The history is provided by the patient and medical records. No language interpreter was used.  Abdominal Pain Pain location:  Epigastric and RUQ Pain quality: aching, cramping and dull   Pain radiates to:  Back Pain severity:  Moderate Onset quality:  Gradual Duration:  1 day Timing:  Constant Progression:  Improving Chronicity:  Recurrent Relieved by:  Nothing Worsened by:  Palpation Ineffective treatments:  None tried Associated symptoms: nausea (resolved now) and vomiting   Associated symptoms: no chest pain, no chills, no constipation, no cough, no diarrhea, no dysuria, no fatigue, no fever, no flatus and no shortness of breath   Risk factors: not pregnant        Home Medications Prior to Admission medications   Medication Sig Start Date End Date Taking? Authorizing Provider  acetaminophen (TYLENOL) 500 MG tablet Take 2 tablets (1,000 mg total) by mouth every 6 (six) hours. Patient not taking: Reported on 07/06/2021 07/01/21   Warner Mccreedyas, Anuka, MD  furosemide (LASIX) 20 MG tablet Take 1 tablet (20 mg total) by mouth daily. 07/01/21 07/06/21  Warner Mccreedyas, Anuka, MD  ibuprofen (ADVIL) 600 MG tablet Take 1 tablet (600 mg total) by mouth every 6 (six) hours. Patient not taking: Reported on 08/09/2021 07/01/21   Warner Mccreedyas, Anuka, MD  medroxyPROGESTERone (DEPO-PROVERA) 150 MG/ML injection Inject 1 mL (150 mg total) into the muscle every 3 (three) months. 08/09/21   Aviva SignsWilliams, Marie L, CNM  NIFEdipine (ADALAT CC) 30 MG 24 hr tablet Take 1 tablet (30 mg total) by mouth daily. Patient not taking: Reported on 08/09/2021 07/01/21 08/30/21  Warner Mccreedyas, Anuka, MD  oxyCODONE (OXY IR/ROXICODONE) 5 MG immediate release tablet Take 1 tablet (5 mg total) by mouth every 6 (six) hours as needed for severe  pain. Patient not taking: Reported on 07/06/2021 07/01/21   Warner Mccreedyas, Anuka, MD  Prenat w/o A Vit-FeFum-FePo-FA (FOLIVANE-OB) 85-1 MG CAPS Take 1 capsule by mouth daily. Patient not taking: Reported on 08/09/2021 12/21/20   Aviva SignsWilliams, Marie L, CNM      Allergies    Shellfish allergy and Sulfa antibiotics    Review of Systems   Review of Systems  Constitutional:  Negative for chills, fatigue and fever.  HENT:  Negative for congestion.   Respiratory:  Negative for cough, chest tightness, shortness of breath and wheezing.   Cardiovascular:  Negative for chest pain and palpitations.  Gastrointestinal:  Positive for abdominal pain, nausea (resolved now) and vomiting. Negative for abdominal distention, constipation, diarrhea and flatus.  Genitourinary:  Negative for dysuria, flank pain and frequency.  Musculoskeletal:  Positive for back pain. Negative for neck pain and neck stiffness.  Skin:  Negative for rash and wound.  Neurological:  Negative for light-headedness, numbness and headaches.  Psychiatric/Behavioral:  Negative for agitation and confusion.   All other systems reviewed and are negative.   Physical Exam Updated Vital Signs BP (!) 126/95 (BP Location: Left Arm)   Pulse 80   Temp 98.4 F (36.9 C) (Oral)   Resp 18   Ht 5\' 6"  (1.676 m)   Wt 108.9 kg   SpO2 100%   Breastfeeding No   BMI 38.74 kg/m  Physical Exam Vitals and nursing note reviewed.  Constitutional:      General: She is  not in acute distress.    Appearance: She is well-developed.  HENT:     Head: Normocephalic and atraumatic.     Mouth/Throat:     Mouth: Mucous membranes are moist.  Eyes:     Conjunctiva/sclera: Conjunctivae normal.  Cardiovascular:     Rate and Rhythm: Normal rate and regular rhythm.     Heart sounds: No murmur heard. Pulmonary:     Effort: Pulmonary effort is normal. No respiratory distress.     Breath sounds: Normal breath sounds.  Abdominal:     General: Bowel sounds are normal. There is  no distension.     Palpations: Abdomen is soft.     Tenderness: There is abdominal tenderness in the right upper quadrant and epigastric area. There is no right CVA tenderness, left CVA tenderness, guarding or rebound.    Musculoskeletal:        General: No swelling.     Cervical back: Neck supple.  Skin:    General: Skin is warm and dry.     Capillary Refill: Capillary refill takes less than 2 seconds.  Neurological:     General: No focal deficit present.     Mental Status: She is alert.  Psychiatric:        Mood and Affect: Mood normal.     ED Results / Procedures / Treatments   Labs (all labs ordered are listed, but only abnormal results are displayed) Labs Reviewed  URINALYSIS, ROUTINE W REFLEX MICROSCOPIC - Abnormal; Notable for the following components:      Result Value   Hgb urine dipstick TRACE (*)    All other components within normal limits  COMPREHENSIVE METABOLIC PANEL - Abnormal; Notable for the following components:   Total Protein 8.3 (*)    AST 250 (*)    ALT 156 (*)    All other components within normal limits  CBC WITH DIFFERENTIAL/PLATELET - Abnormal; Notable for the following components:   Hemoglobin 10.8 (*)    HCT 34.6 (*)    All other components within normal limits  URINALYSIS, MICROSCOPIC (REFLEX) - Abnormal; Notable for the following components:   Bacteria, UA MANY (*)    All other components within normal limits  PREGNANCY, URINE  LIPASE, BLOOD    EKG None   Radiology US Abdomen Limited RUQ (LIVER/GB)  Result Date: 10/30/2021 CLINICAL DATA:  Right upper quadrant pain EXAM: ULTRASOUND ABDOMEN LIMITED RIGHT UPPER QUADRANT COMPARISON:  None Available. FINDINGS: Gallbladder: Small gallstones. Gallbladder wall thickened at 8.8 mm. Pericholecystic fluid. Positive sonographic Murphy sign Common bile duct: Diameter: 3.7 mm Liver: No focal lesion identified. Within normal limits in parenchymal echogenicity. Portal vein is patent on color Doppler  imaging with normal direction of blood flow towards the liver. Other: None IMPRESSION: Small gallstones with gallbladder thickening and positive sonographic Murphy sign. Findings consistent with acute cholecystitis. Electronically Signed   By: Marlan Palau M.D.   On: 10/30/2021 13:06    Procedures Procedures    CRITICAL CARE Performed by: Canary Brim Lamyra Malcolm Total critical care time: 35 minutes Critical care time was exclusive of separately billable procedures and treating other patients. Critical care was necessary to treat or prevent imminent or life-threatening deterioration. Critical care was time spent personally by me on the following activities: development of treatment plan with patient and/or surrogate as well as nursing, discussions with consultants, evaluation of patient's response to treatment, examination of patient, obtaining history from patient or surrogate, ordering and performing treatments and interventions, ordering and review  of laboratory studies, ordering and review of radiographic studies, pulse oximetry and re-evaluation of patient's condition.    Medications Ordered in ED Medications  cefTRIAXone (ROCEPHIN) 2 g in sodium chloride 0.9 % 100 mL IVPB (2 g Intravenous New Bag/Given 10/30/21 1354)    ED Course/ Medical Decision Making/ A&P                           Medical Decision Making Amount and/or Complexity of Data Reviewed Labs: ordered. Radiology: ordered.  Risk Decision regarding hospitalization.    Kadence Mikkelson is a 31 y.o. female with a past medical history significant for previous C-section who presents with intermittent abdominal pain.  Patient reports that over the last month she has had 3 episodes of severe pain in her epigastric area right upper quadrant and right back that have come and gone.  She reports that she had nausea and vomiting with it previously but last night woke up with severe pain in her epigastric and right upper quadrant and  right back.  She reports it was not stabbing or sharp but more aching.  She did have something greasy with notches last night but denies any spicy food.  She think she had some reflux when she was pregnant but otherwise has not had a lot of reflux troubles.  She denies trauma.  Denies rashes to suggest shingles.  Denies any urinary changes, constipation, or diarrhea.  She reports the pain was up to a 9 out of 10 in severity.  She reports no fevers, chills, chest pain, or shortness of breath.  No vaginal complaints reported.  On exam, lungs clear and chest nontender.  Abdomen is tender in the epigastric area and right upper quadrant.  Right flank is also tender to palpation but the rest of the back is nontender.  Bowel sounds were appreciated.  Good pulses in extremities.  Patient resting comfortably with reassuring vital signs initially.  Given the patient's right upper quadrant abdominal tenderness I am somewhat concerned about her gallbladder.  We will get right upper quadrant ultrasound to add to the labs that started in triage.  We will add lactic acid in case this is more of an infectious etiology.  As her symptoms have eased off right before arrival we will hold on nausea or pain medicine at this time but will wait for ultrasound and labs.  Anticipate reassessment after work-up to determine disposition.  Labs returned and patient does have LFT elevation.  Lipase normal.  T. bili normal.  Mild anemia similar to prior.  Urinalysis does not show convincing evidence of UTI.  CBC shows no leukocytosis .  Ultrasound was performed and does show evidence of acute cholecystitis.  Given the patient's LFT elevation, pain, and the ultrasound findings I do suspect acute cholecystitis.  I called and spoke with Dr. Darnell Level who would like the patient admitted to his service to [facility] MD team for surgery either today or tomorrow.  He recommended Rocephin for antibiotics and she will remain n.p.o.  Patient will be  admitted to [facility] for further management of acute cholecystitis.           Final Clinical Impression(s) / ED Diagnoses Final diagnoses:  Cholecystitis, acute  RUQ abdominal pain  Gallstones     Clinical Impression: 1. Cholecystitis, acute   2. RUQ abdominal pain   3. Gallstones     Disposition: Admit  This note was prepared with assistance of Dragon  voice recognition software. Occasional wrong-word or sound-a-like substitutions may have occurred due to the inherent limitations of voice recognition software.     Yue Glasheen, Gwenyth Allegra, MD 10/30/21 1429

## 2021-10-30 NOTE — H&P (Signed)
Jodi Roberts is an 31 y.o. female.    Chief Complaint: abdominal pain, acute cholecystitis  HPI: Patient is a 31 year old female admitted with signs and symptoms of acute cholecystitis and biliary colic.  Patient is about 4 months postpartum from the birth of her first child by cesarean section.  Approximately 3 weeks ago she began having intermittent epigastric abdominal pain associated with nausea and 1 episode of emesis.  She denies fevers or chills.  She denies jaundice or acholic stools.  She has had no prior history of hepatobiliary or pancreatic disease.  There is a family history of gallbladder disease in the patient's brother.  Patient developed epigastric abdominal pain approximately 3 AM this morning.  Pain persisted and she presented to [facility] for evaluation.  Laboratory studies were obtained and were notable for mildly elevated AST and ALT.  Total bilirubin was normal.  Lipase was normal.  Ultrasound examination was obtained.  This demonstrated multiple small gallstones with gallbladder wall thickening and a positive sonographic Murphy sign consistent with acute cholecystitis.  Patient was referred to general surgery and is admitted to the surgical service at [facility] for management.  Past Medical History:  Diagnosis Date   Arthritis    osteoarthritis    Past Surgical History:  Procedure Laterality Date   CESAREAN SECTION N/A 06/29/2021   Procedure: CESAREAN SECTION;  Surgeon: Radene Gunning, MD;  Location: [place] LD ORS;  Service: Obstetrics;  Laterality: N/A;   DILATION AND CURETTAGE OF UTERUS  2016   SAB   KNEE ARTHROSCOPY Right    reconstruction surgery   KNEE CARTILAGE SURGERY Left    KNEE CLOSED REDUCTION Left 03/07/2019   Procedure: CLOSED MANIPULATION LEFT  KNEE;  Surgeon: Leandrew Koyanagi, MD;  Location: [place];  Service: Orthopedics;  Laterality: Left;   LEG SURGERY Right    rods and screws   TOTAL KNEE ARTHROPLASTY Left  12/09/2018   TOTAL KNEE ARTHROPLASTY Left 12/09/2018   Procedure: LEFT TOTAL KNEE ARTHROPLASTY;  Surgeon: Leandrew Koyanagi, MD;  Location: [place];  Service: Orthopedics;  Laterality: Left;   WISDOM TOOTH EXTRACTION      Family History  Problem Relation Age of Onset   Cancer Neg Hx    Diabetes Neg Hx    Hypertension Neg Hx    Social History:  reports that she has never smoked. She has never used smokeless tobacco. She reports that she does not drink alcohol and does not use drugs.  Allergies:  Allergies  Allergen Reactions   Shellfish Allergy Anaphylaxis and Swelling    Throat closes Only with eating.  Ok to touch iodine or betadine.   Sulfa Antibiotics Anaphylaxis    Medications Prior to Admission  Medication Sig Dispense Refill   medroxyPROGESTERone (DEPO-PROVERA) 150 MG/ML injection Inject 1 mL (150 mg total) into the muscle every 3 (three) months. 1 mL 5    Results for orders placed or performed during the hospital encounter of 10/30/21 (from the past 48 hour(s))  Urinalysis, Routine w reflex microscopic Urine, Clean Catch     Status: Abnormal   Collection Time: 10/30/21 10:33 AM  Result Value Ref Range   Color, Urine YELLOW YELLOW   APPearance CLEAR CLEAR   Specific Gravity, Urine 1.020 1.005 - 1.030   pH 5.5 5.0 - 8.0   Glucose, UA NEGATIVE NEGATIVE mg/dL   Hgb urine dipstick TRACE (A) NEGATIVE   Bilirubin Urine NEGATIVE NEGATIVE   Ketones, ur  NEGATIVE NEGATIVE mg/dL   Protein, ur NEGATIVE NEGATIVE mg/dL   Nitrite NEGATIVE NEGATIVE   Leukocytes,Ua NEGATIVE NEGATIVE    Comment: Performed at [facility], [address] [place], [place] [address]  Pregnancy, urine     Status: None   Collection Time: 10/30/21 10:33 AM  Result Value Ref Range   Preg Test, Ur NEGATIVE NEGATIVE    Comment:        THE SENSITIVITY OF THIS METHODOLOGY IS >20 mIU/mL. Performed at [facility], [address] [place], [place] [address]   Urinalysis, Microscopic  (reflex)     Status: Abnormal   Collection Time: 10/30/21 10:33 AM  Result Value Ref Range   RBC / HPF 0-5 0 - 5 RBC/hpf   WBC, UA 0-5 0 - 5 WBC/hpf   Bacteria, UA MANY (A) NONE SEEN   Squamous Epithelial / LPF 6-10 0 - 5    Comment: Performed at [facility], [address] [place], [place] [address]  Comprehensive metabolic panel     Status: Abnormal   Collection Time: 10/30/21 11:30 AM  Result Value Ref Range   Sodium 139 135 - 145 mmol/L   Potassium 4.4 3.5 - 5.1 mmol/L   Chloride 108 98 - 111 mmol/L   CO2 23 22 - 32 mmol/L   Glucose, Bld 93 70 - 99 mg/dL    Comment: Glucose reference range applies only to samples taken after fasting for at least 8 hours.   BUN 11 6 - 20 mg/dL   Creatinine, Ser 9.16 0.44 - 1.00 mg/dL   Calcium 9.3 8.9 - 38.4 mg/dL   Total Protein 8.3 (H) 6.5 - 8.1 g/dL   Albumin 3.7 3.5 - 5.0 g/dL   AST 665 (H) 15 - 41 U/L   ALT 156 (H) 0 - 44 U/L   Alkaline Phosphatase 115 38 - 126 U/L   Total Bilirubin 0.9 0.3 - 1.2 mg/dL   GFR, Estimated >99 >35 mL/min    Comment: (NOTE) Calculated using the CKD-EPI Creatinine Equation (2021)    Anion gap 8 5 - 15    Comment: Performed at [facility], [address] [place], [place] [address]  CBC with Differential     Status: Abnormal   Collection Time: 10/30/21 11:30 AM  Result Value Ref Range   WBC 7.1 4.0 - 10.5 K/uL   RBC 3.94 3.87 - 5.11 MIL/uL   Hemoglobin 10.8 (L) 12.0 - 15.0 g/dL   HCT 93.9 (L) 03.0 - 09.2 %   MCV 87.8 80.0 - 100.0 fL   MCH 27.4 26.0 - 34.0 pg   MCHC 31.2 30.0 - 36.0 g/dL   RDW 33.0 07.6 - 22.6 %   Platelets 297 150 - 400 K/uL    Comment: REPEATED TO VERIFY   nRBC 0.0 0.0 - 0.2 %   Neutrophils Relative % 67 %   Neutro Abs 4.7 1.7 - 7.7 K/uL   Lymphocytes Relative 26 %   Lymphs Abs 1.8 0.7 - 4.0 K/uL   Monocytes Relative 6 %   Monocytes Absolute 0.4 0.1 - 1.0 K/uL   Eosinophils Relative 1 %   Eosinophils Absolute 0.1 0.0 - 0.5 K/uL   Basophils Relative 0 %    Basophils Absolute 0.0 0.0 - 0.1 K/uL   Immature Granulocytes 0 %   Abs Immature Granulocytes 0.02 0.00 - 0.07 K/uL    Comment: Performed at [facility], [address] [place], [place] [address]  Lipase, blood     Status: None   Collection Time: 10/30/21 11:30 AM  Result Value Ref Range   Lipase 25 11 - 51 U/L    Comment: Performed at [facility], [address] [place], [place] [address]   US Abdomen Limited RUQ (LIVER/GB)  Result Date: 10/30/2021 CLINICAL DATA:  Right upper quadrant pain EXAM: ULTRASOUND ABDOMEN LIMITED RIGHT UPPER QUADRANT COMPARISON:  None Available. FINDINGS: Gallbladder: Small gallstones. Gallbladder wall thickened at 8.8 mm. Pericholecystic fluid. Positive sonographic Murphy sign Common bile duct: Diameter: 3.7 mm Liver: No focal lesion identified. Within normal limits in parenchymal echogenicity. Portal vein is patent on color Doppler imaging with normal direction of blood flow towards the liver. Other: None IMPRESSION: Small gallstones with gallbladder thickening and positive sonographic Murphy sign. Findings consistent with acute cholecystitis. Electronically Signed   By: Marlan Palau M.D.   On: 10/30/2021 13:06    Review of Systems  Physical Exam   Blood pressure 139/89, pulse 79, temperature 98.2 F (36.8 C), temperature source Oral, resp. rate 16, height 5\' 6"  (1.676 m), weight 108.9 kg, SpO2 100 %, not currently breastfeeding.  CONSTITUTIONAL: no acute distress; conversant; no obvious deformities  EYES: Conjunctiva clear and moist; pupils equal bilaterally  NECK: trachea midline; no thyroid nodularity  LUNGS: respiratory effort normal & unlabored; no wheeze; no rales  CV: rate and rhythm regular; no significant murmur; no edema bilat lower extremities  GI: abdomen is soft, obese, without distention.  Mild tenderness to deep palpation right upper quadrant.  No palpable mass.  No guarding.  Bowel sounds are  present.  MSK: normal range of motion of extremities; no clubbing; no cyanosis  PSYCH: appropriate affect for situation; alert and oriented to person, place, & time  LYMPHATIC: no palpable cervical lymphadenopathy    Assessment/Plan  Acute cholecystitis, cholelithiasis, biliary colic  Patient is admitted to the surgical service and started empirically on IV antibiotics.  Patient has improved symptomatically since this morning and will be allowed a limited diet this evening.  We will make her n.p.o. after midnight.  I would like to repeat her laboratory studies in the morning.  If her liver enzymes have improved, then we will plan to proceed with laparoscopic cholecystectomy and intraoperative cholangiography tomorrow.  If her hepatic enzymes are significantly elevated, then we will seek gastroenterology consultation and possible need for preoperative ERCP.  I discussed laparoscopic cholecystectomy with intraoperative cholangiography with the patient this evening.  I explained the procedure.  I explained the size and location of the surgical incisions.  Discussed general anesthesia.  We discussed the hospital stay to be anticipated.  We discussed her postoperative recovery and return to work and activities.  The patient understands and agrees to proceed.  , MD [facility] practice Office: [phone]   [phone], MD 10/30/2021, 6:31 PM

## 2021-10-31 ENCOUNTER — Encounter (HOSPITAL_COMMUNITY): Payer: Self-pay | Admitting: Anesthesiology

## 2021-10-31 DIAGNOSIS — K8010 Calculus of gallbladder with chronic cholecystitis without obstruction: Secondary | ICD-10-CM | POA: Diagnosis not present

## 2021-10-31 LAB — COMPREHENSIVE METABOLIC PANEL
ALT: 109 U/L — ABNORMAL HIGH (ref 0–44)
AST: 69 U/L — ABNORMAL HIGH (ref 15–41)
Albumin: 3.3 g/dL — ABNORMAL LOW (ref 3.5–5.0)
Alkaline Phosphatase: 104 U/L (ref 38–126)
Anion gap: 8 (ref 5–15)
BUN: 11 mg/dL (ref 6–20)
CO2: 21 mmol/L — ABNORMAL LOW (ref 22–32)
Calcium: 8.9 mg/dL (ref 8.9–10.3)
Chloride: 108 mmol/L (ref 98–111)
Creatinine, Ser: 0.67 mg/dL (ref 0.44–1.00)
GFR, Estimated: >60 mL/min (ref >60)
Glucose, Bld: 90 mg/dL (ref 70–99)
Potassium: 4 mmol/L (ref 3.5–5.1)
Sodium: 137 mmol/L (ref 135–145)
Total Bilirubin: 0.7 mg/dL (ref 0.3–1.2)
Total Protein: 7.6 g/dL (ref 6.5–8.1)

## 2021-10-31 MED ORDER — ENOXAPARIN SODIUM 40 MG/0.4ML IJ SOSY
40.0000 mg | PREFILLED_SYRINGE | INTRAMUSCULAR | Status: DC
  Administered 2021-10-31: 40 mg via SUBCUTANEOUS
  Filled 2021-10-31: qty 0.4

## 2021-10-31 NOTE — Progress Notes (Signed)
Subjective: CC: MEG/RUQ pain resolved except with palpation. No n/v. Hx C-Section, no other abdominal surgeries. Is not on blood thinners.   Objective: Vital signs in last 24 hours: Temp:  [98 F (36.7 C)-98.6 F (37 C)] 98.3 F (36.8 C) (09/18 0842) Pulse Rate:  [79-94] 92 (09/18 0842) Resp:  [16-18] 16 (09/18 0842) BP: (111-139)/(69-98) 116/81 (09/18 0842) SpO2:  [99 %-100 %] 100 % (09/18 0842) Weight:  [108.9 kg] 108.9 kg (09/17 1025)    Intake/Output from previous day: 09/17 0701 - 09/18 0700 In: 899.1 [P.O.:360; I.V.:482.9; IV Piggyback:56.2] Out: 0  Intake/Output this shift: No intake/output data recorded.  PE: Gen:  Alert, NAD, pleasant Card:  Reg Pulm:  CTAB, no W/R/R, effort normal Abd: Soft, ND, mild epigastric and RUQ ttp, +BS Ext:  No LE edema Psych: A&Ox3   Lab Results:  Recent Labs    10/30/21 1130  WBC 7.1  HGB 10.8*  HCT 34.6*  PLT 297   BMET Recent Labs    10/30/21 1130 10/31/21 0425  NA 139 137  K 4.4 4.0  CL 108 108  CO2 23 21*  GLUCOSE 93 90  BUN 11 11  CREATININE 0.76 0.67  CALCIUM 9.3 8.9   PT/INR No results for input(s): "LABPROT", "INR" in the last 72 hours. CMP     Component Value Date/Time   NA 137 10/31/2021 0425   NA 137 06/23/2021 1106   K 4.0 10/31/2021 0425   CL 108 10/31/2021 0425   CO2 21 (L) 10/31/2021 0425   GLUCOSE 90 10/31/2021 0425   BUN 11 10/31/2021 0425   BUN 8 06/23/2021 1106   CREATININE 0.67 10/31/2021 0425   CALCIUM 8.9 10/31/2021 0425   PROT 7.6 10/31/2021 0425   PROT 6.4 06/23/2021 1106   ALBUMIN 3.3 (L) 10/31/2021 0425   ALBUMIN 3.4 (L) 06/23/2021 1106   AST 69 (H) 10/31/2021 0425   ALT 109 (H) 10/31/2021 0425   ALKPHOS 104 10/31/2021 0425   BILITOT 0.7 10/31/2021 0425   BILITOT 0.3 06/23/2021 1106   GFRNONAA >60 10/31/2021 0425   GFRAA >60 12/10/2018 0258   Lipase     Component Value Date/Time   LIPASE 25 10/30/2021 1130    Studies/Results: US Abdomen Limited RUQ  (LIVER/GB)  Result Date: 10/30/2021 CLINICAL DATA:  Right upper quadrant pain EXAM: ULTRASOUND ABDOMEN LIMITED RIGHT UPPER QUADRANT COMPARISON:  None Available. FINDINGS: Gallbladder: Small gallstones. Gallbladder wall thickened at 8.8 mm. Pericholecystic fluid. Positive sonographic Murphy sign Common bile duct: Diameter: 3.7 mm Liver: No focal lesion identified. Within normal limits in parenchymal echogenicity. Portal vein is patent on color Doppler imaging with normal direction of blood flow towards the liver. Other: None IMPRESSION: Small gallstones with gallbladder thickening and positive sonographic Murphy sign. Findings consistent with acute cholecystitis. Electronically Signed   By: Marlan Palau M.D.   On: 10/30/2021 13:06    Anti-infectives: Anti-infectives (From admission, onward)    Start     Dose/Rate Route Frequency Ordered Stop   10/31/21 1000  cefTRIAXone (ROCEPHIN) 2 g in sodium chloride 0.9 % 100 mL IVPB        2 g 200 mL/hr over 30 Minutes Intravenous Every 24 hours 10/30/21 1843 11/07/21 0959   10/30/21 1345  cefTRIAXone (ROCEPHIN) 2 g in sodium chloride 0.9 % 100 mL IVPB        2 g 200 mL/hr over 30 Minutes Intravenous  Once 10/30/21 1340 10/30/21 1424  Assessment/Plan Acute Cholecystitis  - Imaging concerning for Acute Cholecystitis. WBC wnl yesterday. Afebrile. LFT's down today. Recommend proceeding with Laparoscopic Cholecystectomy -  I have explained the procedure, risks, and aftercare of Laparoscopic cholecystectomy.  Risks include but are not limited to anesthesia (MI, CVA, death), bleeding, infection, wound problems, hernia, possible need for subtotal cholecystectomy, bile leak, injury to common bile duct/liver/intestine, increased risk of DVT/PE and diarrhea post op.  She seems to understand and agrees to proceed. Will plan for OR today with Dr. Kieth Brightly pending OR availability.   FEN - NPO, IVF VTE - SCDs, Lovenox  ID - Rocephin  I reviewed nursing  notes, ED provider notes, last 24 h vitals and pain scores, last 48 h intake and output, last 24 h labs and trends, and last 24 h imaging results.    LOS: 0 days    Jillyn Ledger , [facility] 10/31/2021, 9:44 AM Please see Amion for pager number during day hours 7:00am-4:30pm

## 2021-10-31 NOTE — Anesthesia Preprocedure Evaluation (Deleted)
Anesthesia Evaluation  Patient identified by MRN, date of birth, ID band Patient awake    Reviewed: Allergy & Precautions, NPO status , Patient's Chart, lab work & pertinent test results, reviewed documented beta blocker date and time   Airway Mallampati: II  TM Distance: >3 FB Neck ROM: Full    Dental no notable dental hx. (+) Teeth Intact   Pulmonary neg pulmonary ROS,    Pulmonary exam normal breath sounds clear to auscultation       Cardiovascular hypertension, Normal cardiovascular exam Rhythm:Regular Rate:Normal     Neuro/Psych negative neurological ROS  negative psych ROS   GI/Hepatic Neg liver ROS, Cholelithiasis with acute cholecystitis   Endo/Other  Obesity  Renal/GU negative Renal ROS  negative genitourinary   Musculoskeletal  (+) Arthritis , Osteoarthritis,    Abdominal (+) + obese,  Abdomen: tender.    Peds  Hematology  (+) Blood dyscrasia, anemia ,   Anesthesia Other Findings   Reproductive/Obstetrics negative OB ROS                             Anesthesia Physical Anesthesia Plan  ASA: 2  Anesthesia Plan: General   Post-op Pain Management: Precedex, Dilaudid IV, Ketamine IV* and Ofirmev IV (intra-op)*   Induction: Intravenous, Rapid sequence and Cricoid pressure planned  PONV Risk Score and Plan: 4 or greater and Treatment may vary due to age or medical condition, Scopolamine patch - Pre-op, Midazolam, Dexamethasone and Ondansetron  Airway Management Planned: Oral ETT  Additional Equipment: None  Intra-op Plan:   Post-operative Plan: Extubation in OR  Informed Consent: I have reviewed the patients History and Physical, chart, labs and discussed the procedure including the risks, benefits and alternatives for the proposed anesthesia with the patient or authorized representative who has indicated his/her understanding and acceptance.     Dental advisory  given  Plan Discussed with: Anesthesiologist and CRNA  Anesthesia Plan Comments:         Anesthesia Quick Evaluation

## 2021-10-31 NOTE — TOC Progression Note (Signed)
Transition of Care [facility]) - Progression Note    Patient Details  Name: Jodi Roberts MRN: [id number] Date of Birth: Nov 30, 1990  [facility]) CM/SW Contact  Servando Snare, LCSW Phone Number: 10/31/2021, 9:56 AM  Clinical Narrative:     .toc       Expected Discharge Plan and Services                                                 Social Determinants of Health (SDOH) Interventions Food Insecurity Interventions: Intervention Not Indicated Housing Interventions: Intervention Not Indicated Transportation Interventions: Intervention Not Indicated Utilities Interventions: Intervention Not Indicated  Readmission Risk Interventions     No data to display

## 2021-11-01 ENCOUNTER — Observation Stay (HOSPITAL_COMMUNITY): Payer: BC Managed Care – PPO | Admitting: Anesthesiology

## 2021-11-01 ENCOUNTER — Encounter (HOSPITAL_COMMUNITY)
Admission: EM | Disposition: A | Discharge status: Home / Self Care | Payer: Self-pay | Source: Home / Self Care | Attending: Emergency Medicine

## 2021-11-01 ENCOUNTER — Observation Stay (HOSPITAL_COMMUNITY): Payer: BC Managed Care – PPO

## 2021-11-01 ENCOUNTER — Other Ambulatory Visit: Payer: Self-pay

## 2021-11-01 ENCOUNTER — Encounter (HOSPITAL_COMMUNITY): Payer: Self-pay

## 2021-11-01 DIAGNOSIS — K810 Acute cholecystitis: Secondary | ICD-10-CM | POA: Diagnosis not present

## 2021-11-01 DIAGNOSIS — K8012 Calculus of gallbladder with acute and chronic cholecystitis without obstruction: Secondary | ICD-10-CM | POA: Diagnosis not present

## 2021-11-01 DIAGNOSIS — K8010 Calculus of gallbladder with chronic cholecystitis without obstruction: Secondary | ICD-10-CM | POA: Diagnosis not present

## 2021-11-01 DIAGNOSIS — K8020 Calculus of gallbladder without cholecystitis without obstruction: Secondary | ICD-10-CM | POA: Diagnosis not present

## 2021-11-01 HISTORY — PX: CHOLECYSTECTOMY: SHX55

## 2021-11-01 LAB — COMPREHENSIVE METABOLIC PANEL
ALT: 74 U/L — ABNORMAL HIGH (ref 0–44)
AST: 30 U/L (ref 15–41)
Albumin: 3.4 g/dL — ABNORMAL LOW (ref 3.5–5.0)
Alkaline Phosphatase: 98 U/L (ref 38–126)
Anion gap: 7 (ref 5–15)
BUN: 8 mg/dL (ref 6–20)
CO2: 24 mmol/L (ref 22–32)
Calcium: 9.2 mg/dL (ref 8.9–10.3)
Chloride: 110 mmol/L (ref 98–111)
Creatinine, Ser: 0.76 mg/dL (ref 0.44–1.00)
GFR, Estimated: >60 mL/min (ref >60)
Glucose, Bld: 91 mg/dL (ref 70–99)
Potassium: 3.8 mmol/L (ref 3.5–5.1)
Sodium: 141 mmol/L (ref 135–145)
Total Bilirubin: 0.8 mg/dL (ref 0.3–1.2)
Total Protein: 7.6 g/dL (ref 6.5–8.1)

## 2021-11-01 LAB — CBC
HCT: 32.7 % — ABNORMAL LOW (ref 36.0–46.0)
Hemoglobin: 10.2 g/dL — ABNORMAL LOW (ref 12.0–15.0)
MCH: 27.9 pg (ref 26.0–34.0)
MCHC: 31.2 g/dL (ref 30.0–36.0)
MCV: 89.3 fL (ref 80.0–100.0)
Platelets: 323 10*3/uL (ref 150–400)
RBC: 3.66 MIL/uL — ABNORMAL LOW (ref 3.87–5.11)
RDW: 14.6 % (ref 11.5–15.5)
WBC: 7.3 10*3/uL (ref 4.0–10.5)
nRBC: 0 % (ref 0.0–0.2)

## 2021-11-01 SURGERY — LAPAROSCOPIC CHOLECYSTECTOMY WITH INTRAOPERATIVE CHOLANGIOGRAM
Anesthesia: General

## 2021-11-01 MED ORDER — DEXAMETHASONE SODIUM PHOSPHATE 10 MG/ML IJ SOLN
INTRAMUSCULAR | Status: AC
  Filled 2021-11-01: qty 2

## 2021-11-01 MED ORDER — FENTANYL CITRATE (PF) 250 MCG/5ML IJ SOLN
INTRAMUSCULAR | Status: AC
  Filled 2021-11-01: qty 5

## 2021-11-01 MED ORDER — ACETAMINOPHEN 160 MG/5ML PO SOLN: 325.0000 mg | ORAL | Status: DC | PRN

## 2021-11-01 MED ORDER — OXYCODONE HCL 5 MG/5ML PO SOLN: 5.0000 mg | Freq: Once | ORAL | Status: DC | PRN

## 2021-11-01 MED ORDER — ONDANSETRON HCL 4 MG/2ML IJ SOLN
INTRAMUSCULAR | Status: AC
  Filled 2021-11-01: qty 4

## 2021-11-01 MED ORDER — TRAMADOL HCL 50 MG PO TABS: 50.0000 mg | ORAL_TABLET | Freq: Four times a day (QID) | ORAL | 0 refills | Status: DC | PRN

## 2021-11-01 MED ORDER — SUGAMMADEX SODIUM 500 MG/5ML IV SOLN
INTRAVENOUS | Status: DC | PRN
  Administered 2021-11-01: 500 mg via INTRAVENOUS

## 2021-11-01 MED ORDER — ONDANSETRON HCL 4 MG/2ML IJ SOLN
INTRAMUSCULAR | Status: DC | PRN
  Administered 2021-11-01: 4 mg via INTRAVENOUS

## 2021-11-01 MED ORDER — LACTATED RINGERS IR SOLN
Status: DC | PRN
  Administered 2021-11-01: 1000 mL

## 2021-11-01 MED ORDER — 0.9 % SODIUM CHLORIDE (POUR BTL) OPTIME
TOPICAL | Status: DC | PRN
  Administered 2021-11-01: 1000 mL

## 2021-11-01 MED ORDER — LIDOCAINE HCL (PF) 2 % IJ SOLN
INTRAMUSCULAR | Status: AC
  Filled 2021-11-01: qty 25

## 2021-11-01 MED ORDER — SUCCINYLCHOLINE CHLORIDE 200 MG/10ML IV SOSY
PREFILLED_SYRINGE | INTRAVENOUS | Status: AC
  Filled 2021-11-01: qty 10

## 2021-11-01 MED ORDER — BUPIVACAINE-EPINEPHRINE 0.25% -1:200000 IJ SOLN
INTRAMUSCULAR | Status: DC | PRN
  Administered 2021-11-01: 30 mL

## 2021-11-01 MED ORDER — PROPOFOL 10 MG/ML IV BOLUS
INTRAVENOUS | Status: AC
  Filled 2021-11-01: qty 20

## 2021-11-01 MED ORDER — ACETAMINOPHEN 325 MG PO TABS: 325.0000 mg | ORAL_TABLET | ORAL | Status: DC | PRN

## 2021-11-01 MED ORDER — PROMETHAZINE HCL 25 MG/ML IJ SOLN: 6.2500 mg | INTRAMUSCULAR | Status: DC | PRN

## 2021-11-01 MED ORDER — AMISULPRIDE (ANTIEMETIC) 5 MG/2ML IV SOLN: 10.0000 mg | Freq: Once | INTRAVENOUS | Status: DC | PRN

## 2021-11-01 MED ORDER — PHENYLEPHRINE 80 MCG/ML (10ML) SYRINGE FOR IV PUSH (FOR BLOOD PRESSURE SUPPORT)
PREFILLED_SYRINGE | INTRAVENOUS | Status: AC
  Filled 2021-11-01: qty 10

## 2021-11-01 MED ORDER — FENTANYL CITRATE PF 50 MCG/ML IJ SOSY
PREFILLED_SYRINGE | INTRAMUSCULAR | Status: AC
  Filled 2021-11-01: qty 3

## 2021-11-01 MED ORDER — LACTATED RINGERS IV SOLN: INTRAVENOUS | Status: DC

## 2021-11-01 MED ORDER — TRAMADOL HCL 50 MG PO TABS
50.0000 mg | ORAL_TABLET | Freq: Four times a day (QID) | ORAL | Status: DC | PRN
  Administered 2021-11-01: 50 mg via ORAL

## 2021-11-01 MED ORDER — LIDOCAINE 2% (20 MG/ML) 5 ML SYRINGE
INTRAMUSCULAR | Status: DC | PRN
  Administered 2021-11-01: 60 mg via INTRAVENOUS

## 2021-11-01 MED ORDER — FENTANYL CITRATE (PF) 250 MCG/5ML IJ SOLN
INTRAMUSCULAR | Status: DC | PRN
  Administered 2021-11-01 (×10): 50 ug via INTRAVENOUS

## 2021-11-01 MED ORDER — EPHEDRINE 5 MG/ML INJ
INTRAVENOUS | Status: AC
  Filled 2021-11-01: qty 5

## 2021-11-01 MED ORDER — MIDAZOLAM HCL 2 MG/2ML IJ SOLN
INTRAMUSCULAR | Status: DC | PRN
  Administered 2021-11-01 (×2): 1 mg via INTRAVENOUS

## 2021-11-01 MED ORDER — ROCURONIUM BROMIDE 10 MG/ML (PF) SYRINGE
PREFILLED_SYRINGE | INTRAVENOUS | Status: DC | PRN
  Administered 2021-11-01: 60 mg via INTRAVENOUS

## 2021-11-01 MED ORDER — SUGAMMADEX SODIUM 500 MG/5ML IV SOLN
INTRAVENOUS | Status: AC
  Filled 2021-11-01: qty 10

## 2021-11-01 MED ORDER — MIDAZOLAM HCL 2 MG/2ML IJ SOLN
INTRAMUSCULAR | Status: AC
  Filled 2021-11-01: qty 2

## 2021-11-01 MED ORDER — DEXAMETHASONE SODIUM PHOSPHATE 10 MG/ML IJ SOLN
INTRAMUSCULAR | Status: DC | PRN
  Administered 2021-11-01: 10 mg via INTRAVENOUS

## 2021-11-01 MED ORDER — ROCURONIUM BROMIDE 10 MG/ML (PF) SYRINGE
PREFILLED_SYRINGE | INTRAVENOUS | Status: AC
  Filled 2021-11-01: qty 20

## 2021-11-01 MED ORDER — ORAL CARE MOUTH RINSE: 15.0000 mL | Freq: Once | OROMUCOSAL | Status: AC

## 2021-11-01 MED ORDER — ACETAMINOPHEN 500 MG PO TABS
1000.0000 mg | ORAL_TABLET | Freq: Four times a day (QID) | ORAL | Status: DC
  Administered 2021-11-01: 1000 mg via ORAL
  Filled 2021-11-01: qty 2

## 2021-11-01 MED ORDER — BUPIVACAINE-EPINEPHRINE (PF) 0.25% -1:200000 IJ SOLN
INTRAMUSCULAR | Status: AC
  Filled 2021-11-01: qty 30

## 2021-11-01 MED ORDER — FENTANYL CITRATE PF 50 MCG/ML IJ SOSY
25.0000 ug | PREFILLED_SYRINGE | INTRAMUSCULAR | Status: DC | PRN
  Administered 2021-11-01: 50 ug via INTRAVENOUS

## 2021-11-01 MED ORDER — CHLORHEXIDINE GLUCONATE 0.12 % MT SOLN
15.0000 mL | Freq: Once | OROMUCOSAL | Status: AC
  Administered 2021-11-01: 15 mL via OROMUCOSAL

## 2021-11-01 MED ORDER — SODIUM CHLORIDE (PF) 0.9 % IJ SOLN
INTRAMUSCULAR | Status: DC | PRN
  Administered 2021-11-01: 20 mL

## 2021-11-01 MED ORDER — OXYCODONE HCL 5 MG PO TABS: 5.0000 mg | ORAL_TABLET | Freq: Once | ORAL | Status: DC | PRN

## 2021-11-01 MED ORDER — ACETAMINOPHEN 10 MG/ML IV SOLN: 1000.0000 mg | Freq: Once | INTRAVENOUS | Status: DC | PRN

## 2021-11-01 MED ORDER — ACETAMINOPHEN 500 MG PO TABS: 1000.0000 mg | ORAL_TABLET | Freq: Four times a day (QID) | ORAL | 0 refills | Status: DC | PRN

## 2021-11-01 MED ORDER — PROPOFOL 10 MG/ML IV BOLUS
INTRAVENOUS | Status: DC | PRN
  Administered 2021-11-01: 150 mg via INTRAVENOUS

## 2021-11-01 MED ORDER — LACTATED RINGERS IV SOLN: INTRAVENOUS | Status: DC | PRN

## 2021-11-01 SURGICAL SUPPLY — 45 items
APPLIER CLIP 5 13 M/L LIGAMAX5 (MISCELLANEOUS)
APPLIER CLIP ROT 10 11.4 M/L (STAPLE) ×1
BAG COUNTER SPONGE SURGICOUNT (BAG) IMPLANT
BENZOIN TINCTURE PRP APPL 2/3 (GAUZE/BANDAGES/DRESSINGS) ×1 IMPLANT
BNDG ADH 1X3 SHEER STRL LF (GAUZE/BANDAGES/DRESSINGS) ×4 IMPLANT
CABLE HIGH FREQUENCY MONO STRZ (ELECTRODE) ×1 IMPLANT
CHLORAPREP W/TINT 26 (MISCELLANEOUS) ×1 IMPLANT
CLIP APPLIE 5 13 M/L LIGAMAX5 (MISCELLANEOUS) IMPLANT
CLIP APPLIE ROT 10 11.4 M/L (STAPLE) IMPLANT
CLIP LIGATING HEM O LOK PURPLE (MISCELLANEOUS) IMPLANT
CLIP LIGATING HEMO O LOK GREEN (MISCELLANEOUS) IMPLANT
COVER MAYO STAND XLG (MISCELLANEOUS) ×1 IMPLANT
COVER SURGICAL LIGHT HANDLE (MISCELLANEOUS) ×1 IMPLANT
DERMABOND ADVANCED .7 DNX12 (GAUZE/BANDAGES/DRESSINGS) IMPLANT
DRAIN CHANNEL 19F RND (DRAIN) IMPLANT
DRAPE C-ARM 42X120 X-RAY (DRAPES) IMPLANT
EVACUATOR SILICONE 100CC (DRAIN) IMPLANT
GLOVE BIOGEL PI IND STRL 7.0 (GLOVE) ×1 IMPLANT
GLOVE SURG SS PI 7.0 STRL IVOR (GLOVE) ×1 IMPLANT
GOWN STRL REUS W/ TWL LRG LVL3 (GOWN DISPOSABLE) ×1 IMPLANT
GOWN STRL REUS W/ TWL XL LVL3 (GOWN DISPOSABLE) IMPLANT
GOWN STRL REUS W/TWL LRG LVL3 (GOWN DISPOSABLE) ×1
GOWN STRL REUS W/TWL XL LVL3 (GOWN DISPOSABLE)
GRASPER SUT TROCAR 14GX15 (MISCELLANEOUS) IMPLANT
IRRIG SUCT STRYKERFLOW 2 WTIP (MISCELLANEOUS) ×1
IRRIGATION SUCT STRKRFLW 2 WTP (MISCELLANEOUS) ×1 IMPLANT
KIT BASIN OR (CUSTOM PROCEDURE TRAY) ×1 IMPLANT
KIT TURNOVER KIT A (KITS) IMPLANT
POUCH RETRIEVAL ECOSAC 10 (ENDOMECHANICALS) ×1 IMPLANT
POUCH RETRIEVAL ECOSAC 10MM (ENDOMECHANICALS) ×1
SCISSORS LAP 5X35 DISP (ENDOMECHANICALS) ×1 IMPLANT
SET CHOLANGIOGRAPH MIX (MISCELLANEOUS) IMPLANT
SET TUBE SMOKE EVAC HIGH FLOW (TUBING) ×1 IMPLANT
SLEEVE Z-THREAD 5X100MM (TROCAR) ×2 IMPLANT
SPIKE FLUID TRANSFER (MISCELLANEOUS) ×1 IMPLANT
STOPCOCK 4 WAY LG BORE MALE ST (IV SETS) IMPLANT
STRIP CLOSURE SKIN 1/2X4 (GAUZE/BANDAGES/DRESSINGS) ×1 IMPLANT
SUT ETHILON 2 0 PS N (SUTURE) IMPLANT
SUT MNCRL AB 4-0 PS2 18 (SUTURE) ×1 IMPLANT
SUT VICRYL 0 ENDOLOOP (SUTURE) IMPLANT
TOWEL OR 17X26 10 PK STRL BLUE (TOWEL DISPOSABLE) ×1 IMPLANT
TOWEL OR NON WOVEN STRL DISP B (DISPOSABLE) IMPLANT
TRAY LAPAROSCOPIC (CUSTOM PROCEDURE TRAY) ×1 IMPLANT
TROCAR 11X100 Z THREAD (TROCAR) ×1 IMPLANT
TROCAR Z-THREAD OPTICAL 5X100M (TROCAR) ×1 IMPLANT

## 2021-11-01 NOTE — Anesthesia Procedure Notes (Signed)
Procedure Name: Intubation Date/Time: 11/01/2021 12:01 PM  Performed by: Cynda Familia, CRNAPre-anesthesia Checklist: Patient identified, Emergency Drugs available, Suction available and Patient being monitored Patient Re-evaluated:Patient Re-evaluated prior to induction Oxygen Delivery Method: Circle System Utilized Preoxygenation: Pre-oxygenation with 100% oxygen Induction Type: IV induction and Cricoid Pressure applied Ventilation: Mask ventilation without difficulty Laryngoscope Size: Miller and 2 Grade View: Grade I Tube type: Oral Number of attempts: 1 Airway Equipment and Method: Stylet Placement Confirmation: ETT inserted through vocal cords under direct vision, positive ETCO2 and breath sounds checked- equal and bilateral Secured at: 22 cm Tube secured with: Tape Dental Injury: Teeth and Oropharynx as per pre-operative assessment  Comments: IV induction Jodi Roberts-- intubation AM CRNA atraumatic--- teeth and mouth as pre op-- pt reports implant right front loose- rotates- left front with slight chip- all unchanged after intubation

## 2021-11-01 NOTE — Transfer of Care (Signed)
Immediate Anesthesia Transfer of Care Note  Patient: Jodi Roberts  Procedure(s) Performed: LAPAROSCOPIC CHOLECYSTECTOMY WITH INTRAOPERATIVE CHOLANGIOGRAM  Patient Location: PACU  Anesthesia Type:General  Level of Consciousness: sedated  Airway & Oxygen Therapy: Patient Spontanous Breathing and Patient connected to face mask oxygen  Post-op Assessment: Report given to RN and Post -op Vital signs reviewed and stable  Post vital signs: Reviewed and stable  Last Vitals:  Vitals Value Taken Time  BP 148/101 11/01/21 1313  Temp    Pulse 96 11/01/21 1314  Resp 14 11/01/21 1314  SpO2 100 % 11/01/21 1314  Vitals shown include unvalidated device data.  Last Pain:  Vitals:   11/01/21 1107  TempSrc:   PainSc: 0-No pain      Patients Stated Pain Goal: 3 (78/29/56 2130)  Complications: No notable events documented.

## 2021-11-01 NOTE — Progress Notes (Signed)
Pre Procedure note for inpatients:   Jodi Roberts has been scheduled for Procedure(s): LAPAROSCOPIC CHOLECYSTECTOMY WITH INTRAOPERATIVE CHOLANGIOGRAM (N/A) today. The various methods of treatment have been discussed with the patient. After consideration of the risks, benefits and treatment options the patient has consented to the planned procedure.   The patient has been seen and labs reviewed. There are no changes in the patient's condition to prevent proceeding with the planned procedure today.  Recent labs:  Lab Results  Component Value Date   WBC 7.3 11/01/2021   HGB 10.2 (L) 11/01/2021   HCT 32.7 (L) 11/01/2021   PLT 323 11/01/2021   GLUCOSE 91 11/01/2021   ALT 74 (H) 11/01/2021   AST 30 11/01/2021   NA 141 11/01/2021   K 3.8 11/01/2021   CL 110 11/01/2021   CREATININE 0.76 11/01/2021   BUN 8 11/01/2021   CO2 24 11/01/2021   INR 1.1 12/03/2018   HGBA1C 5.4 12/21/2020    Mickeal Skinner, MD 11/01/2021 11:08 AM

## 2021-11-01 NOTE — Anesthesia Postprocedure Evaluation (Signed)
Anesthesia Post Note  Patient: Jodi Roberts  Procedure(s) Performed: LAPAROSCOPIC CHOLECYSTECTOMY WITH INTRAOPERATIVE CHOLANGIOGRAM     Patient location during evaluation: PACU Anesthesia Type: General Level of consciousness: awake and alert Pain management: pain level controlled Vital Signs Assessment: post-procedure vital signs reviewed and stable Respiratory status: spontaneous breathing, nonlabored ventilation, respiratory function stable and patient connected to nasal cannula oxygen Cardiovascular status: blood pressure returned to baseline and stable Postop Assessment: no apparent nausea or vomiting Anesthetic complications: no   No notable events documented.  Last Vitals:  Vitals:   11/01/21 1413 11/01/21 1503  BP: (!) 138/92 (!) 131/92  Pulse: 94 89  Resp: 18 18  Temp: 37 C 36.9 C  SpO2: 92% 92%    Last Pain:  Vitals:   11/01/21 1503  TempSrc: Oral  PainSc:                  Effie Berkshire

## 2021-11-01 NOTE — Op Note (Signed)
PATIENT:  Jodi Roberts  31 y.o. female  PRE-OPERATIVE DIAGNOSIS:  ACUTE CHOLECYSTITIS  POST-OPERATIVE DIAGNOSIS:  ACUTE CHOLECYSTITIS  PROCEDURE:  Procedure(s): LAPAROSCOPIC CHOLECYSTECTOMY WITH INTRAOPERATIVE CHOLANGIOGRAM   SURGEON:  Atif Chapple, Arta Bruce, MD   ASSISTANT: Alyson Reedy  ANESTHESIA:   local and general  Indications for procedure: [name] is a 31 y.o. female with symptoms of Abdominal pain and Nausea and vomiting consistent with gallbladder disease, Confirmed by ultrasound.  Description of procedure: The patient was brought into the operative suite, placed supine. Anesthesia was administered with endotracheal tube. Patient was strapped in place and foot board was secured. All pressure points were offloaded by foam padding. The patient was prepped and draped in the usual sterile fashion.  A periumbilical incision was made and optical entry was used to enter the abdomen. 2 5 mm trocars were placed on in the right lateral space on in the right subcostal space. A 83mm trocar was placed in the subxiphoid space. Marcaine was infused to the subxiphoid space and lateral upper right abdomen in the transversus abdominis plane. Next the patient was placed in reverse trendelenberg. The gallbladder appeareddilated and chronically inflamed. Omentum was adhered to the gallbladder and was taken down with cautery/blunt dissection.  The gallbladder was retracted cephalad and lateral. The peritoneum was reflected off the infundibulum working lateral to medial. The cystic duct and cystic artery were identified and further dissection revealed a critical view, due to concern for choledocholithiasis a cholangiogram was performed with ductotomy and cook catheter passed through a separate subcostal stab incision. On ductotomy, multiple small stones were removed from the cystic duct. Normal ductal anatomy was seen without filling defects. The cystic duct and cystic artery were doubly clipped and  ligated.   The gallbladder was removed off the liver bed with cautery. The Gallbladder was placed in a specimen bag. The gallbladder fossa was irrigated and hemostasis was applied with cautery. The gallbladder was removed via the 69mm trocar. No dilation was required for removal, therefore no fascial closure was performed and loose stones were removed. Pneumoperitoneum was removed, all trocar were removed. All incisions were closed with 4-0 monocryl subcuticular stitch. The patient woke from anesthesia and was brought to PACU in stable condition. All counts were correct  Findings: multiple gallstones  Specimen: gallbladder  Blood loss: 20 ml  Local anesthesia: 30 ml Marcaine  Complications: none  PLAN OF CARE: Discharge to home after PACU  PATIENT DISPOSITION:  PACU - hemodynamically stable.  [facility], [place]

## 2021-11-01 NOTE — Discharge Summary (Signed)
[facility] Discharge Summary   Patient ID: Jodi Roberts MRN: [id number] DOB/AGE: 11/04/1990 31 y.o.  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 EXAM: INTRAOPERATIVE CHOLANGIOGRAM TECHNIQUE: Cholangiographic images from the C-arm fluoroscopic device were submitted for interpretation post-operatively. Please see the procedural report for the amount of contrast and the fluoroscopy time utilized. COMPARISON:  10/30/2021 FINDINGS: Intraoperative antegrade injection via the cystic duct which opacifies the common bile duct and central portions of the intrahepatic biliary tree. Contrast is visualized flowing freely into the duodenum. There are no filling defects. No significant intra or extrahepatic biliary ductal dilation. No apparent anomalous anatomical configuration of the biliary tree. IMPRESSION: No evidence of choledocholithiasis. Jodi Cancer, MD [facility] Electronically Signed   By: Jodi Roberts M.D.   On: 11/01/2021 12:59    Procedures Dr. Gurney Roberts- Laparoscopic Cholecystectomy with [facility] 11/01/21   Hospital Course:  31 y/o F  who presented to the ED with abdominal pain associated with nausea.  Workup showed cholecystitis.  Patient was admitted and underwent procedure listed above.  Tolerated procedure well and was transferred to the floor.   Diet was advanced as tolerated.  On POD#0, the patient was voiding well, tolerating diet, ambulating well, pain well controlled, vital signs stable, incisions c/d/i and felt stable for discharge home.  Patient will follow up in our office as below and knows to call with questions or concerns.  She will call to confirm appointment date/time.    Physical Exam: General:  Alert, NAD, pleasant, comfortable Abd:  Soft, ND, mild tenderness, incisions C/D/I  Allergies as of 11/01/2021       Reactions   Shellfish Allergy Anaphylaxis, Swelling   Throat closes Only with eating.  Ok to touch iodine or betadine.   Sulfa Antibiotics Anaphylaxis        Medication List     TAKE these medications    acetaminophen 500 MG tablet Commonly known as: TYLENOL Take 2 tablets (1,000 mg total) by mouth every 6 (six) hours as needed for mild pain.   medroxyPROGESTERone 150 MG/ML injection Commonly known as: Depo-Provera Inject 1 mL (150 mg total) into the muscle every 3 (three) months.   traMADol 50 MG tablet Commonly known as: ULTRAM Take 1 tablet (50 mg total) by mouth every 6 (six) hours as needed for moderate pain or severe pain (pain not releived by tylenol or ibuprofen).          Follow-up Information     Roberts, Jodi Hurl, PA-C Follow up.   Specialty: General Surgery Why: our office is scheduling you for post-operative follow up. please call to confirm appointment date/time. please arrive 20-30 minutes early to your appointment. Contact information: [place] [address] [phone]                 Signed: Obie Roberts, [facility] 11/01/2021, 3:31 PM

## 2021-11-01 NOTE — Progress Notes (Signed)
Discharge instructions given to patient and all questions were answered.  

## 2021-11-01 NOTE — Discharge Instructions (Signed)
[facility] LAPAROSCOPIC SURGERY: POST OP INSTRUCTIONS Always review your discharge instruction sheet given to you by the facility where your surgery was performed. IF YOU HAVE DISABILITY OR FAMILY LEAVE FORMS, YOU MUST BRING THEM TO THE OFFICE FOR PROCESSING.   DO NOT GIVE THEM TO YOUR DOCTOR.  PAIN CONTROL  First take acetaminophen (Tylenol) AND/or ibuprofen (Advil) to control your pain after surgery.  Follow directions on package.  Taking acetaminophen (Tylenol) and/or ibuprofen (Advil) regularly after surgery will help to control your pain and lower the amount of prescription pain medication you may need.  You should not take more than 3,000 mg (3 grams) of acetaminophen (Tylenol) in 24 hours.  You should not take ibuprofen (Advil), aleve, motrin, naprosyn or other NSAIDS if you have a history of stomach ulcers or chronic kidney disease.  A prescription for pain medication may be given to you upon discharge.  Take your pain medication as prescribed, if you still have uncontrolled pain after taking acetaminophen (Tylenol) or ibuprofen (Advil). Use ice packs to help control pain. If you need a refill on your pain medication, please contact your pharmacy.  They will contact our office to request authorization. Prescriptions will not be filled after 5pm or on week-ends.  HOME MEDICATIONS Take your usually prescribed medications unless otherwise directed.  DIET You should follow a light diet the first few days after arrival home.  Be sure to include lots of fluids daily. Avoid fatty, fried foods.   CONSTIPATION It is common to experience some constipation after surgery and if you are taking pain medication.  Increasing fluid intake and taking a stool softener (such as Colace) will usually help or prevent this problem from occurring.  A mild laxative (Milk of Magnesia or Miralax) should be taken according to package instructions if there are no bowel movements after 48  hours.  WOUND/INCISION CARE Most patients will experience some swelling and bruising in the area of the incisions.  Ice packs will help.  Swelling and bruising can take several days to resolve.  Unless discharge instructions indicate otherwise, follow guidelines below  STERI-STRIPS - you may remove your outer bandages 48 hours after surgery, and you may shower at that time.  You have steri-strips (small skin tapes) in place directly over the incision.  These strips should be left on the skin for 7-10 days.   DERMABOND/SKIN GLUE - you may shower in 24 hours.  The glue will flake off over the next 2-3 weeks. Any sutures or staples will be removed at the office during your follow-up visit.  ACTIVITIES You may resume regular (light) daily activities beginning the next day--such as daily self-care, walking, climbing stairs--gradually increasing activities as tolerated.  You may have sexual intercourse when it is comfortable.  Refrain from any heavy lifting or straining until approved by your doctor. You may drive when you are no longer taking prescription pain medication, you can comfortably wear a seatbelt, and you can safely maneuver your car and apply brakes.  FOLLOW-UP You should see your doctor in the office for a follow-up appointment approximately 2-3 weeks after your surgery.  You should have been given your post-op/follow-up appointment when your surgery was scheduled.  If you did not receive a post-op/follow-up appointment, make sure that you call for this appointment within a day or two after you arrive home to insure a convenient appointment time.   WHEN TO CALL YOUR DOCTOR: Fever over 101.0 Inability to urinate Continued bleeding from incision.   Increased pain, redness, or drainage from the incision. Increasing abdominal pain  The clinic staff is available to answer your questions during regular business hours.  Please don't hesitate to call and ask to speak to one of the nurses for  clinical concerns.  If you have a medical emergency, go to the nearest emergency room or call 911.  A surgeon from [facility] is always on call at the hospital. [address], [address], [place], [place]  [address] ? [address] [address], [place], [place]   [address] [phone] ? [phone] ? FAX [phone] Web site: [email or web address]  

## 2021-11-01 NOTE — Anesthesia Procedure Notes (Signed)
Date/Time: 11/01/2021 1:07 PM  Performed by: Cynda Familia, CRNAOxygen Delivery Method: Simple face mask Placement Confirmation: positive ETCO2 and breath sounds checked- equal and bilateral Dental Injury: Teeth and Oropharynx as per pre-operative assessment

## 2021-11-01 NOTE — Anesthesia Preprocedure Evaluation (Addendum)
Anesthesia Evaluation  Patient identified by MRN, date of birth, ID band Patient awake    Reviewed: Allergy & Precautions, NPO status , Patient's Chart, lab work & pertinent test results  Airway Mallampati: II  TM Distance: >3 FB Neck ROM: Full    Dental  (+) Teeth Intact, Dental Advisory Given, Implants, Loose,    Pulmonary neg pulmonary ROS,    breath sounds clear to auscultation       Cardiovascular hypertension,  Rhythm:Regular Rate:Normal     Neuro/Psych negative neurological ROS  negative psych ROS   GI/Hepatic negative GI ROS, Neg liver ROS,   Endo/Other  negative endocrine ROS  Renal/GU negative Renal ROS     Musculoskeletal  (+) Arthritis ,   Abdominal Normal abdominal exam  (+)   Peds  Hematology negative hematology ROS (+)   Anesthesia Other Findings   Reproductive/Obstetrics                           Anesthesia Physical Anesthesia Plan  ASA: 2  Anesthesia Plan: General   Post-op Pain Management:    Induction: Intravenous  PONV Risk Score and Plan: 4 or greater and Ondansetron, Dexamethasone, Midazolam and Scopolamine patch - Pre-op  Airway Management Planned: Oral ETT  Additional Equipment: None  Intra-op Plan:   Post-operative Plan: Extubation in OR  Informed Consent: I have reviewed the patients History and Physical, chart, labs and discussed the procedure including the risks, benefits and alternatives for the proposed anesthesia with the patient or authorized representative who has indicated his/her understanding and acceptance.     Dental advisory given  Plan Discussed with: CRNA  Anesthesia Plan Comments:        Anesthesia Quick Evaluation

## 2021-11-02 ENCOUNTER — Encounter (HOSPITAL_COMMUNITY): Payer: Self-pay | Admitting: General Surgery

## 2021-11-02 LAB — SURGICAL PATHOLOGY

## 2021-11-04 ENCOUNTER — Ambulatory Visit: Payer: BC Managed Care – PPO

## 2023-02-23 ENCOUNTER — Ambulatory Visit: Payer: BC Managed Care – PPO | Admitting: Orthopaedic Surgery

## 2023-03-02 ENCOUNTER — Ambulatory Visit: Payer: BC Managed Care – PPO | Admitting: Orthopaedic Surgery

## 2023-03-28 ENCOUNTER — Other Ambulatory Visit (INDEPENDENT_AMBULATORY_CARE_PROVIDER_SITE_OTHER): Payer: Self-pay

## 2023-03-28 ENCOUNTER — Encounter: Payer: Self-pay | Admitting: Orthopaedic Surgery

## 2023-03-28 ENCOUNTER — Ambulatory Visit (INDEPENDENT_AMBULATORY_CARE_PROVIDER_SITE_OTHER): Payer: BC Managed Care – PPO | Admitting: Orthopaedic Surgery

## 2023-03-28 ENCOUNTER — Ambulatory Visit: Payer: BC Managed Care – PPO | Admitting: Orthopaedic Surgery

## 2023-03-28 DIAGNOSIS — M25562 Pain in left knee: Secondary | ICD-10-CM

## 2023-03-28 DIAGNOSIS — G8929 Other chronic pain: Secondary | ICD-10-CM

## 2023-03-28 NOTE — Progress Notes (Signed)
Office Visit Note   Patient: Jodi Roberts           Date of Birth: 01/27/1991           MRN: [id number] Visit Date: 03/28/2023              Requested by: No referring provider defined for this encounter. PCP: Patient, No Pcp Per   Assessment & Plan: Visit Diagnoses:  1. Chronic pain of left knee     Plan: Discussed with patient that her imaging as well as her exam appear appropriate.  Hardware appears intact without any concern.  Patient does have a 2 mm valgus jog which is expected.  Discussed with patient that the knee pain in the morning may be muscular in nature.  At this time there is no concern for any issue with the joint itself.  Patient is understanding and agreeable.  Patient will follow-up as needed   Follow-Up Instructions: No follow-ups on file.   Orders:  Orders Placed This Encounter  Procedures   XR Knee 1-2 Views Left   No orders of the defined types were placed in this encounter.     Procedures: No procedures performed   Clinical Data: No additional findings.   Subjective: Chief Complaint  Patient presents with   Left Knee - Pain    Patient is presenting with some morning pain after sleeping in the posterior aspect of her knee.  Patient states that the pain occurs right when she wakes up in when she bends her knee.  Patient states that the pain usually goes away after 10 minutes and does not recur throughout the day.  Patient also notes that she has some clicking when walking on flat surfaces that is painless.  Pain states that sometimes a clicking occur and sometimes it does not.  Patient notes that there is no pain whenever she is walking her has no other concerns at this time.    Review of Systems  Constitutional: Negative.   HENT: Negative.    Eyes: Negative.   Respiratory: Negative.    Cardiovascular: Negative.   Endocrine: Negative.   Musculoskeletal: Negative.   Neurological: Negative.   Hematological: Negative.    Psychiatric/Behavioral: Negative.    All other systems reviewed and are negative.    Objective: Vital Signs: There were no vitals taken for this visit.  Physical Exam Vitals and nursing note reviewed.  Constitutional:      Appearance: She is well-developed.  HENT:     Head: Atraumatic.     Nose: Nose normal.  Eyes:     Extraocular Movements: Extraocular movements intact.  Cardiovascular:     Pulses: Normal pulses.  Pulmonary:     Effort: Pulmonary effort is normal.  Abdominal:     Palpations: Abdomen is soft.  Musculoskeletal:     Cervical back: Neck supple.  Skin:    General: Skin is warm.     Capillary Refill: Capillary refill takes less than 2 seconds.  Neurological:     Mental Status: She is alert. Mental status is at baseline.  Psychiatric:        Behavior: Behavior normal.        Thought Content: Thought content normal.        Judgment: Judgment normal.     Ortho Exam Inspection reveals no gross abnormality of the left knee.  There is no tenderness to palpation over the medial lateral joint lines.  Range of motion is full with flexion extension without  any signs of catching or locking.  Strength is 5 out of 5 with flexion extension of the knee.  No abnormalities with varus motion, there is slight clicking noted with valgus motion of the knee.  There is a 2 mm valgus jog motion noted.  Specialty Comments:  No specialty comments available.  Imaging: XR Knee 1-2 Views Left Result Date: 03/28/2023 X-rays of the left knee show left total knee arthroplasty with press-fit components without any evidence of loosening or subsidence.    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   Past Medical History:  Diagnosis Date   Arthritis    osteoarthritis    Family History  Problem Relation Age of Onset   Cancer Neg Hx    Diabetes Neg Hx    Hypertension Neg Hx     Past Surgical History:  Procedure Laterality Date   CESAREAN SECTION N/A 06/29/2021   Procedure: CESAREAN SECTION;  Surgeon: Milas Hock, MD;  Location: [facility];  Service: Obstetrics;  Laterality: N/A;   CHOLECYSTECTOMY N/A 11/01/2021   Procedure: LAPAROSCOPIC CHOLECYSTECTOMY WITH INTRAOPERATIVE CHOLANGIOGRAM;  Surgeon: Sheliah Hatch De Blanch, MD;  Location: [facility];  Service: General;  Laterality: N/A;   DILATION AND CURETTAGE OF UTERUS  2016   SAB   KNEE ARTHROSCOPY Right    reconstruction surgery   KNEE CARTILAGE SURGERY Left    KNEE CLOSED REDUCTION Left 03/07/2019   Procedure: CLOSED MANIPULATION LEFT  KNEE;  Surgeon: Tarry Kos, MD;  Location: [facility];  Service: Orthopedics;  Laterality: Left;   LEG SURGERY Right    rods and screws   TOTAL KNEE ARTHROPLASTY Left 12/09/2018   TOTAL KNEE ARTHROPLASTY Left 12/09/2018   Procedure: LEFT TOTAL KNEE ARTHROPLASTY;  Surgeon: Tarry Kos, MD;  Location: [facility];  Service: Orthopedics;  Laterality: Left;   WISDOM TOOTH EXTRACTION     Social History   Occupational History   Not on file  Tobacco Use   Smoking status: Never   Smokeless tobacco: Never  Vaping Use   Vaping status: Never Used  Substance and Sexual Activity   Alcohol use: Never   Drug use: Never   Sexual activity: Yes    Birth control/protection: Injection

## 2023-04-13 DIAGNOSIS — H93291 Other abnormal auditory perceptions, right ear: Secondary | ICD-10-CM | POA: Diagnosis not present

## 2023-04-13 DIAGNOSIS — H6123 Impacted cerumen, bilateral: Secondary | ICD-10-CM | POA: Diagnosis not present

## 2023-05-01 DIAGNOSIS — L308 Other specified dermatitis: Secondary | ICD-10-CM | POA: Diagnosis not present

## 2023-05-14 IMAGING — US US MFM OB FOLLOW-UP
1 series · 13 of 28 positions shown · non-contrast
Comparison: none

[Series 1: us mfm ob follow-up · 33 acquisitions, 13 frames shown]
[im 2/33]
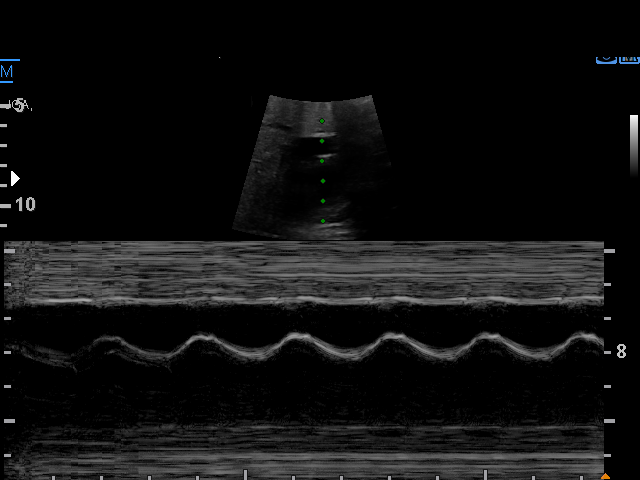
[im 4/33]
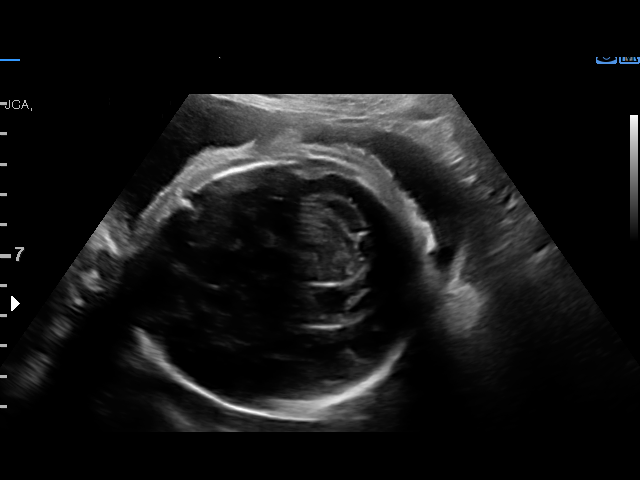
[im 6/33]
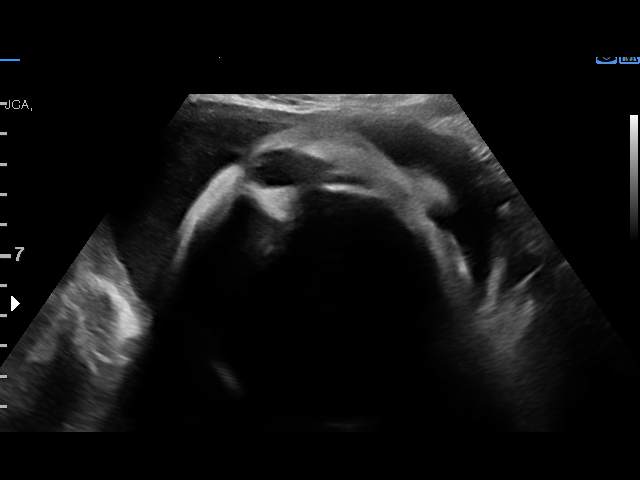
[im 9/33]
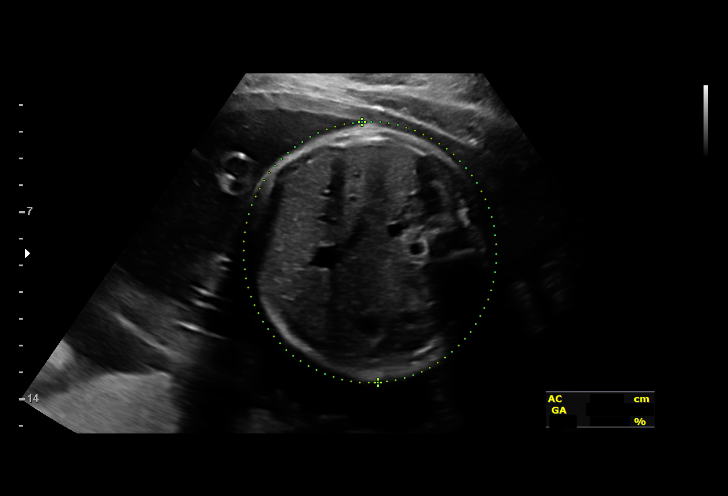
[im 11/33]
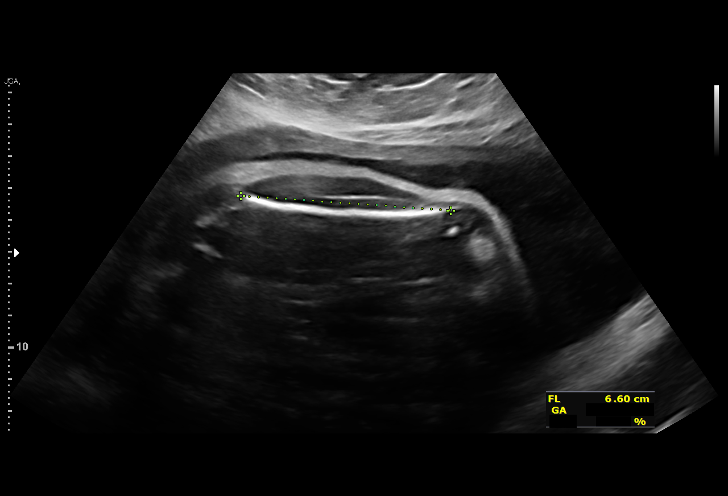
[im 14/33]
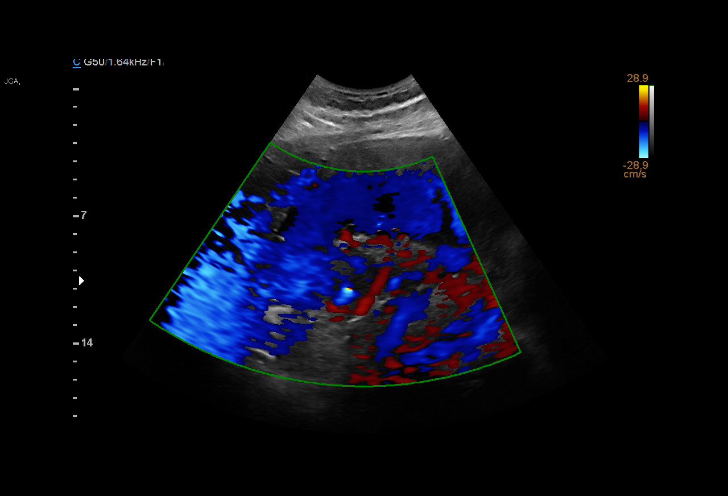
[im 17/33]
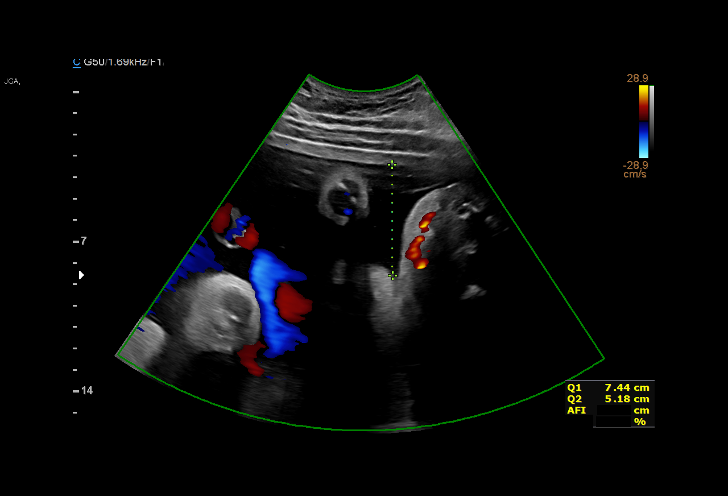
[im 19/33]
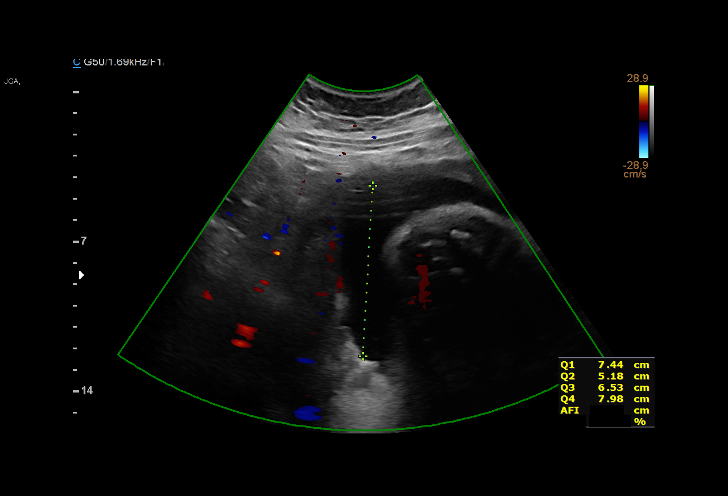
[im 22/33]
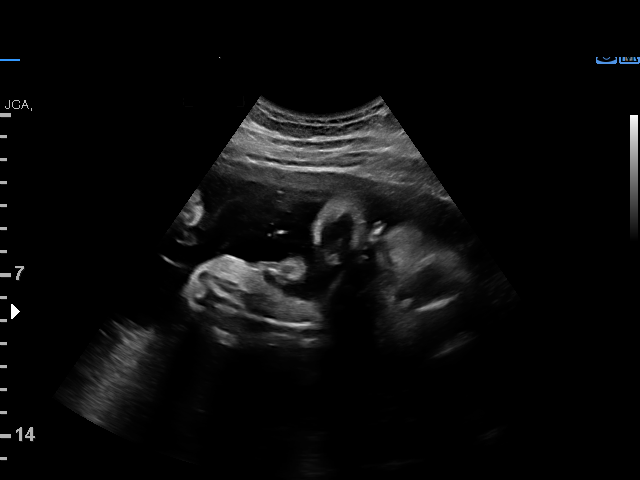
[im 24/33]
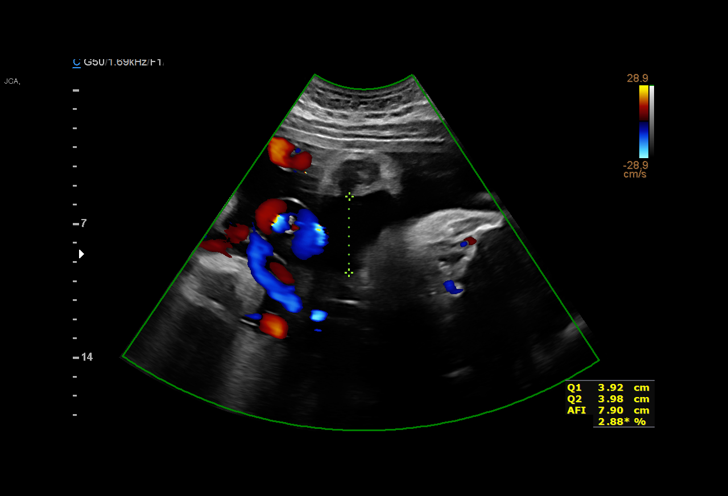
[im 27/33]
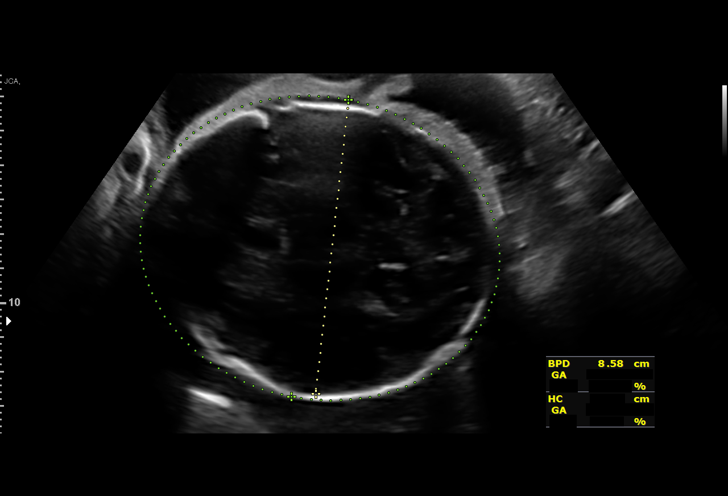
[im 29/33]
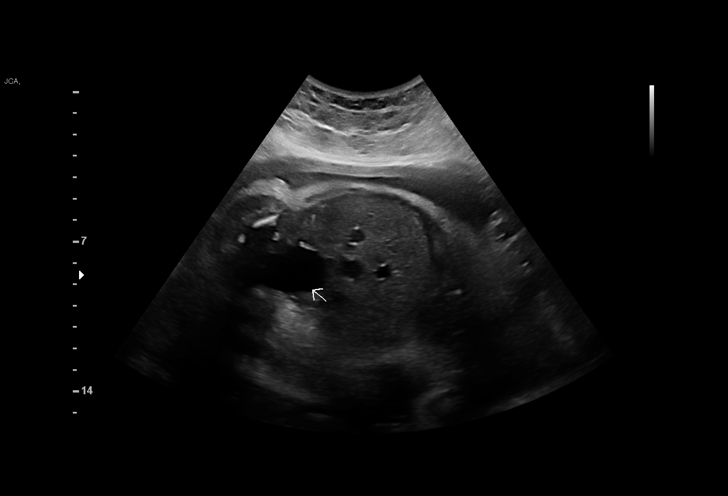
[im 31/33]
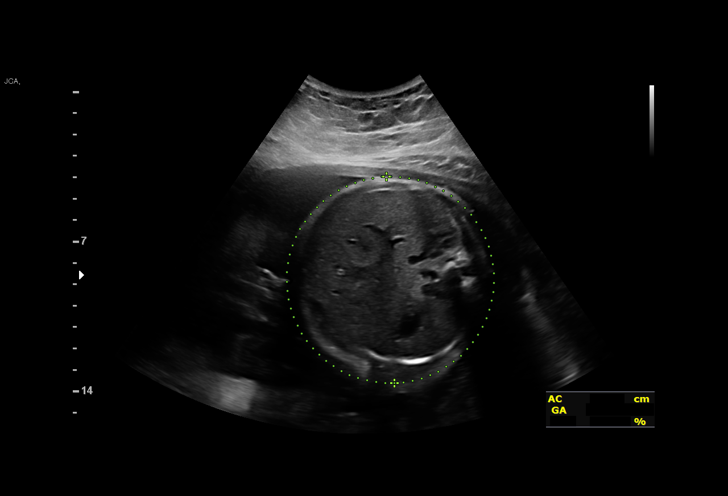

[13 of 28 positions shown; findings below may reference images not displayed]

Indications

 Gestational diabetes in pregnancy,
 unspecified control
 Obesity complicating pregnancy, third
 trimester
 31 weeks gestation of pregnancy
 Encounter for other antenatal screening
 follow-up
 LR NIPS
Fetal Evaluation

 Num Of Fetuses:         1
 Fetal Heart Rate(bpm):  150
 Cardiac Activity:       Observed
 Presentation:           Cephalic
 Placenta:               Posterior
 P. Cord Insertion:      Previously Visualized

 Amniotic Fluid
 AFI FV:      Within normal limits

 AFI Sum(cm)     %Tile       Largest Pocket(cm)
 17.89           67

 RUQ(cm)       RLQ(cm)       LUQ(cm)        LLQ(cm)

Biometry
 BPD:      83.9  mm     G. Age:  33w 5d         97  %    CI:        76.91   %    70 - 86
                                                         FL/HC:      21.5   %    19.3 -
 HC:       303   mm     G. Age:  33w 5d         81  %    HC/AC:      1.01        0.96 -
 AC:      300.7  mm     G. Age:  34w 0d         99  %    FL/BPD:     77.7   %    71 - 87
 FL:       65.2  mm     G. Age:  33w 4d         92  %    FL/AC:      21.7   %    20 - 24

 Est. FW:    6673  gm      5 lb 1 oz     99  %
OB History

 Blood Type:   O+
 Maternal Racial/Ethnic Group:   Black (non-Hispanic)
 Gravidity:    2         Term:   0         SAB:   1
 Living:       0
Gestational Age

 LMP:           31w 1d        Date:  10/10/20                  EDD:   07/17/21
 U/S Today:     33w 5d                                        EDD:   06/29/21
 Best:          31w 1d     Det. By:  LMP  (10/10/20)          EDD:   07/17/21
Anatomy

 Cranium:               Appears normal         LVOT:                   Previously seen
 Cavum:                 Previously seen        Aortic Arch:            Previously seen
 Ventricles:            Appears normal         Ductal Arch:            Previously seen
 Choroid Plexus:        Previously seen        Diaphragm:              Appears normal
 Cerebellum:            Previously seen        Stomach:                Appears normal, left
                                                                       sided
 Posterior Fossa:       Previously seen        Abdomen:                Previously seen
 Nuchal Fold:           Not applicable (>20    Abdominal Wall:         Previously seen
                        wks GA)
 Face:                  Orbits and profile     Cord Vessels:           Previously seen
                        previously seen
 Lips:                  Previously seen        Kidneys:                Appear normal
 Palate:                Not well visualized    Bladder:                Appears normal
 Thoracic:              Appears normal         Spine:                  Previously seen
                        Previously seen
 Heart:                 Previously seen        Upper Extremities:      Previously seen
 RVOT:                  Previously seen        Lower Extremities:      Previously seen

 Other:  Fetus appears to be female. Heels/feet and open hands/5th digits
         previously visualized. Nasal bone previously visualized. Lenses
         visualized. Technically difficult due to maternal habitus and fetal
         position.
Impression
 On today's ultrasound, the estimated fetal weight is at the 99
 percentile and the abdominal circumference measurement is
 at the 99th percentile.  Amniotic fluid is normal and good fetal
 activity seen.
 xxxxxxxxxxxxx[id number]
 Consultation (see [REDACTED] )

 [facility] the [facility] She is G2 5MM4M at 31w 1d gestation
 and is here for fetal growth assessment.
 She has a new diagnosis of gestational diabetes.  She will be
 meeting with the diabetic educator next week.  Patient is
 started checking her blood glucose and reports her fasting
 levels are better controlled and ranges between 93 and 96
 mg/DL.  She is unable to check her postprandial levels
 regularly in the range between 130 and 150 mg/DL.  She
 takes metformin 500 mg twice daily.
 She does not have hypertension.  Her pregnancy is well
 dated by LMP consistent with first trimester ultrasound.

 Gestational diabetes
 I explained the diagnosis of gestational diabetes.  I
 emphasized the importance of good blood glucose control to
 prevent adverse fetal or neonatal outcomes.  I discussed
 blood glucose normal values. I encouraged her to check her
 blood glucose regularly.
 Possible complications of gestational diabetes include fetal
 macrosomia, shoulder dystocia and birth injuries, stillbirth (in
 poorly controlled diabetes) and neonatal respiratory
 syndrome and other complications.
 Patient has just started taking metformin. I encouraged her to
 meet with our diabetic educator next week. If diabetes is not
 well controlled on metformin, insulin may be initiated.

 Exercise reduces the need for insulin.  I encouraged her to
 exercise regularly

 Timing of delivery: In well-controlled diabetes on diet, patient
 can be delivered at 39 weeks' gestation. Vaginal delivery is
 not contraindicated.
 Type 2 diabetes develops in up to 50% of women with GDM. I
 recommend postpartum screening with 75-g glucose load at
 6 to 12 weeks after delivery.
Recommendations

 -Weekly BPP from next week till delivery.
 -We will discuss timing and mode of delivery after next fetal
 growth assessment.
 -Postpartum screening for type 2 diabetes.
                Or, Mathew

## 2023-07-30 ENCOUNTER — Ambulatory Visit: Payer: BC Managed Care – PPO | Admitting: Family Medicine

## 2023-08-24 ENCOUNTER — Ambulatory Visit: Admitting: Family Medicine

## 2023-12-12 ENCOUNTER — Encounter: Payer: Self-pay | Admitting: Family Medicine

## 2023-12-12 ENCOUNTER — Ambulatory Visit: Admitting: Family Medicine

## 2023-12-12 VITALS — BP 110/80 | HR 82 | Temp 98.2°F | Resp 16 | Ht 66.0 in | Wt 262.0 lb

## 2023-12-12 DIAGNOSIS — Z0000 Encounter for general adult medical examination without abnormal findings: Secondary | ICD-10-CM | POA: Insufficient documentation

## 2023-12-12 DIAGNOSIS — Z130 Encounter for screening for diseases of the blood and blood-forming organs and certain disorders involving the immune mechanism: Secondary | ICD-10-CM

## 2023-12-12 DIAGNOSIS — Z1329 Encounter for screening for other suspected endocrine disorder: Secondary | ICD-10-CM | POA: Diagnosis not present

## 2023-12-12 DIAGNOSIS — Z6841 Body Mass Index (BMI) 40.0 and over, adult: Secondary | ICD-10-CM | POA: Diagnosis not present

## 2023-12-12 DIAGNOSIS — Z13228 Encounter for screening for other metabolic disorders: Secondary | ICD-10-CM | POA: Diagnosis not present

## 2023-12-12 DIAGNOSIS — Z13220 Encounter for screening for lipoid disorders: Secondary | ICD-10-CM | POA: Diagnosis not present

## 2023-12-12 DIAGNOSIS — Z8632 Personal history of gestational diabetes: Secondary | ICD-10-CM | POA: Diagnosis not present

## 2023-12-12 LAB — LIPID PANEL
Cholesterol: 145 mg/dL (ref 0–200)
HDL: 48.7 mg/dL (ref >39.00)
LDL Cholesterol: 81 mg/dL (ref 0–99)
NonHDL: 96.23
Total CHOL/HDL Ratio: 3
Triglycerides: 75 mg/dL (ref 0.0–149.0)
VLDL: 15 mg/dL (ref 0.0–40.0)

## 2023-12-12 LAB — COMPREHENSIVE METABOLIC PANEL WITH GFR
ALT: 20 U/L (ref 0–35)
AST: 19 U/L (ref 0–37)
Albumin: 4.2 g/dL (ref 3.5–5.2)
Alkaline Phosphatase: 96 U/L (ref 39–117)
BUN: 12 mg/dL (ref 6–23)
CO2: 27 meq/L (ref 19–32)
Calcium: 9.5 mg/dL (ref 8.4–10.5)
Chloride: 103 meq/L (ref 96–112)
Creatinine, Ser: 0.65 mg/dL (ref 0.40–1.20)
GFR: 115.84 mL/min (ref >60.00)
Glucose, Bld: 81 mg/dL (ref 70–99)
Potassium: 3.9 meq/L (ref 3.5–5.1)
Sodium: 137 meq/L (ref 135–145)
Total Bilirubin: 0.5 mg/dL (ref 0.2–1.2)
Total Protein: 8.7 g/dL — ABNORMAL HIGH (ref 6.0–8.3)

## 2023-12-12 LAB — HEMOGLOBIN A1C: Hgb A1c MFr Bld: 5.7 % (ref 4.6–6.5)

## 2023-12-12 NOTE — Assessment & Plan Note (Signed)
 Stressed the importance of aggressive management of CV risk factors to prevent or delay chronic disease.

## 2023-12-12 NOTE — Patient Instructions (Addendum)
 A few things to remember from today's visit:  Routine general medical examination at a health care facility  Screening for lipoid disorders  Screening for endocrine, metabolic and immunity disorder  History of gestational diabetes  If you need refills for medications you take chronically, please call your pharmacy. Do not use My Chart to request refills or for acute issues that need immediate attention. If you send a my chart message, it may take a few days to be addressed, specially if I am not in the office.  Please be sure medication list is accurate. If a new problem present, please set up appointment sooner than planned today.  Health Maintenance, Female Adopting a healthy lifestyle and getting preventive care are important in promoting health and wellness. Ask your health care provider about: The right schedule for you to have regular tests and exams. Things you can do on your own to prevent diseases and keep yourself healthy. What should I know about diet, weight, and exercise? Eat a healthy diet  Eat a diet that includes plenty of vegetables, fruits, low-fat dairy products, and lean protein. Do not eat a lot of foods that are high in solid fats, added sugars, or sodium. Maintain a healthy weight Body mass index (BMI) is used to identify weight problems. It estimates body fat based on height and weight. Your health care provider can help determine your BMI and help you achieve or maintain a healthy weight. Get regular exercise Get regular exercise. This is one of the most important things you can do for your health. Most adults should: Exercise for at least 150 minutes each week. The exercise should increase your heart rate and make you sweat (moderate-intensity exercise). Do strengthening exercises at least twice a week. This is in addition to the moderate-intensity exercise. Spend less time sitting. Even light physical activity can be beneficial. Watch cholesterol and blood  lipids Have your blood tested for lipids and cholesterol at 33 years of age, then have this test every 5 years. Have your cholesterol levels checked more often if: Your lipid or cholesterol levels are high. You are older than 33 years of age. You are at high risk for heart disease. What should I know about cancer screening? Depending on your health history and family history, you may need to have cancer screening at various ages. This may include screening for: Breast cancer. Cervical cancer. Colorectal cancer. Skin cancer. Lung cancer. What should I know about heart disease, diabetes, and high blood pressure? Blood pressure and heart disease High blood pressure causes heart disease and increases the risk of stroke. This is more likely to develop in people who have high blood pressure readings or are overweight. Have your blood pressure checked: Every 3-5 years if you are 32-32 years of age. Every year if you are 73 years old or older. Diabetes Have regular diabetes screenings. This checks your fasting blood sugar level. Have the screening done: Once every three years after age 6 if you are at a normal weight and have a low risk for diabetes. More often and at a younger age if you are overweight or have a high risk for diabetes. What should I know about preventing infection? Hepatitis B If you have a higher risk for hepatitis B, you should be screened for this virus. Talk with your health care provider to find out if you are at risk for hepatitis B infection. Hepatitis C Testing is recommended for: Everyone born from [birth year] through 1965. Anyone with known  risk factors for hepatitis C. Sexually transmitted infections (STIs) Get screened for STIs, including gonorrhea and chlamydia, if: You are sexually active and are younger than 33 years of age. You are older than 33 years of age and your health care provider tells you that you are at risk for this type of infection. Your sexual  activity has changed since you were last screened, and you are at increased risk for chlamydia or gonorrhea. Ask your health care provider if you are at risk. Ask your health care provider about whether you are at high risk for HIV. Your health care provider may recommend a prescription medicine to help prevent HIV infection. If you choose to take medicine to prevent HIV, you should first get tested for HIV. You should then be tested every 3 months for as long as you are taking the medicine. Pregnancy If you are about to stop having your period (premenopausal) and you may become pregnant, seek counseling before you get pregnant. Take 400 to 800 micrograms (mcg) of folic acid every day if you become pregnant. Ask for birth control (contraception) if you want to prevent pregnancy. Osteoporosis and menopause Osteoporosis is a disease in which the bones lose minerals and strength with aging. This can result in bone fractures. If you are 73 years old or older, or if you are at risk for osteoporosis and fractures, ask your health care provider if you should: Be screened for bone loss. Take a calcium or vitamin D supplement to lower your risk of fractures. Be given hormone replacement therapy (HRT) to treat symptoms of menopause. Follow these instructions at home: Alcohol use Do not drink alcohol if: Your health care provider tells you not to drink. You are pregnant, may be pregnant, or are planning to become pregnant. If you drink alcohol: Limit how much you have to: 0-1 drink a day. Know how much alcohol is in your drink. In the [place] one drink equals one 12 oz bottle of beer (355 mL), one 5 oz glass of wine (148 mL), or one 1 oz glass of hard liquor (44 mL). Lifestyle Do not use any products that contain nicotine or tobacco. These products include cigarettes, chewing tobacco, and vaping devices, such as e-cigarettes. If you need help quitting, ask your health care provider. Do not use street  drugs. Do not share needles. Ask your health care provider for help if you need support or information about quitting drugs. General instructions Schedule regular health, dental, and eye exams. Stay current with your vaccines. Tell your health care provider if: You often feel depressed. You have ever been abused or do not feel safe at home. Summary Adopting a healthy lifestyle and getting preventive care are important in promoting health and wellness. Follow your health care provider's instructions about healthy diet, exercising, and getting tested or screened for diseases. Follow your health care provider's instructions on monitoring your cholesterol and blood pressure. This information is not intended to replace advice given to you by your health care provider. Make sure you discuss any questions you have with your health care provider. Document Revised: 06/21/2020 Document Reviewed: 06/21/2020 Elsevier Patient Education  2024 [employer].

## 2023-12-12 NOTE — Assessment & Plan Note (Signed)
She understands the benefits of wt loss as well as adverse effects of obesity. Consistency with healthy diet and physical activity encouraged.
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
## 2023-12-12 NOTE — Assessment & Plan Note (Signed)
 We discussed the importance of regular physical activity and healthy diet for prevention of chronic illness and/or complications. Preventive guidelines reviewed. Vaccination up to date, declined flu vaccine. Reporting cervical cancer screening as current, she follows with gynecology regularly. Next CPE in a year.

## 2023-12-13 ENCOUNTER — Ambulatory Visit: Payer: Self-pay | Admitting: Family Medicine

## 2023-12-17 ENCOUNTER — Encounter: Payer: Self-pay | Admitting: Radiology

## 2024-02-21 ENCOUNTER — Ambulatory Visit (INDEPENDENT_AMBULATORY_CARE_PROVIDER_SITE_OTHER): Admitting: Orthopaedic Surgery

## 2024-02-21 ENCOUNTER — Other Ambulatory Visit (INDEPENDENT_AMBULATORY_CARE_PROVIDER_SITE_OTHER)

## 2024-02-21 DIAGNOSIS — M79661 Pain in right lower leg: Secondary | ICD-10-CM

## 2024-02-21 DIAGNOSIS — Z96652 Presence of left artificial knee joint: Secondary | ICD-10-CM

## 2024-02-21 NOTE — Progress Notes (Signed)
 "  Office Visit Note   Patient: Jodi Roberts           Date of Birth: 16-Apr-1990           MRN: [id number] Visit Date: 02/21/2024              Requested by: Jordan, Betty G, MD [address] [place],  [place] [address] PCP: Jordan, Betty G, MD   Assessment & Plan: Visit Diagnoses:  1. Status post total left knee replacement   2. Pain in right lower leg     Plan: History of Present Illness Jodi Roberts is a 34 year old female with left total knee arthroplasty and prior open right tibia/fibula fracture who presents with left knee stiffness.  She has intermittent left knee stiffness at specific times of day with severe pain when she tries to move the knee during these episodes. She feels a clipping sensation in the joint but overall function is preserved.  She recently bumped her right lower leg at a prior traumatic open fracture site, causing localized pain and increased sensitivity at the scar, which is chronically sensitive from prior trauma and nerve injury. She has no new or worsening symptoms beyond the localized pain.  Physical Exam MUSCULOSKELETAL: Left knee with normal appearance, no abnormal swelling or redness. Function normal except for clicking sensation.  Right leg exam shows fully healed surgical scars and a large fully healed traumatic scar without any concerning features.  She is tender to the area.  Results Radiology Left knee x-ray: Normal alignment, no evidence of prosthetic loosening, no radiographic abnormalities. (Independently interpreted) Right tibia and fibula x-ray: No radiographic abnormalities. (Independently interpreted)  Assessment and Plan Status post total left knee replacement Intermittent stiffness and pain are typical post-operative symptoms. Radiographs show a well-positioned prosthesis without loosening or abnormalities. - Reviewed left knee radiographs, no abnormalities or prosthesis loosening. - Reassured her that symptoms are typical  post-operatively and prosthesis is normal.  Pain in right lower leg Pain at previous incision site after minor trauma. Radiographs show no acute injury. Increased sensitivity due to prior trauma and nerve injury. - Reviewed right tibia and fibula radiographs, no acute abnormalities. - Reassured her that no new injury occurred and increased sensitivity is expected due to prior nerve injury.  Follow-Up Instructions: No follow-ups on file.   Orders:  Orders Placed This Encounter  Procedures   XR Knee 1-2 Views Left   XR Tibia/Fibula Right   No orders of the defined types were placed in this encounter.     Procedures: No procedures performed   Clinical Data: No additional findings.   Subjective: Chief Complaint  Patient presents with   Right Leg - Pain   Left Knee - Pain    HPI  Review of Systems  Constitutional: Negative.   HENT: Negative.    Eyes: Negative.   Respiratory: Negative.    Cardiovascular: Negative.   Endocrine: Negative.   Musculoskeletal: Negative.   Neurological: Negative.   Hematological: Negative.   Psychiatric/Behavioral: Negative.    All other systems reviewed and are negative.    Objective: Vital Signs: There were no vitals taken for this visit.  Physical Exam Vitals and nursing note reviewed.  Constitutional:      Appearance: She is well-developed.  HENT:     Head: Atraumatic.     Nose: Nose normal.  Eyes:     Extraocular Movements: Extraocular movements intact.  Cardiovascular:     Pulses: Normal pulses.  Pulmonary:  Effort: Pulmonary effort is normal.  Abdominal:     Palpations: Abdomen is soft.  Musculoskeletal:     Cervical back: Neck supple.  Skin:    General: Skin is warm.     Capillary Refill: Capillary refill takes less than 2 seconds.  Neurological:     Mental Status: She is alert. Mental status is at baseline.  Psychiatric:        Behavior: Behavior normal.        Thought Content: Thought content normal.         Judgment: Judgment normal.     Ortho Exam  Specialty Comments:  No specialty comments available.  Imaging: XR Knee 1-2 Views Left Result Date: 02/21/2024 X-rays demonstrate status post left total knee arthroplasty without any evidence of loosening or subsidence.  XR Tibia/Fibula Right Result Date: 02/21/2024 X-rays demonstrate status post IM nail of a right tib-fib fracture with complete fracture consolidation and remodeling.  No hardware complications.  Degenerative changes are noted in the knee joint.    PMFS History: Patient Active Problem List   Diagnosis Date Noted   Pain in right lower leg 02/21/2024   Morbid obesity with body mass index (BMI) of 40.0 to 44.9 in adult [facility]) 12/12/2023   History of gestational diabetes 12/12/2023   Routine general medical examination at a health care facility 12/12/2023   Acute cholecystitis 10/30/2021   Cholecystitis, acute with cholelithiasis 10/30/2021   S/P cesarean section 06/29/2021   Preeclampsia, third trimester 06/27/2021   Macrosomia of fetus affecting management of mother, antepartum, third trimester 05/26/2021   Status post total left knee replacement 12/09/2018   Past Medical History:  Diagnosis Date   Arthritis    osteoarthritis    Family History  Problem Relation Age of Onset   Kidney disease Mother    Drug abuse Mother    Pancreatic cancer Father    Healthy Sister    Healthy Sister    Healthy Sister    Cancer Neg Hx    Diabetes Neg Hx    Hypertension Neg Hx     Past Surgical History:  Procedure Laterality Date   CESAREAN SECTION N/A 06/29/2021   Procedure: CESAREAN SECTION;  Surgeon: Cleatus Moccasin, MD;  Location: [facility];  Service: Obstetrics;  Laterality: N/A;   CHOLECYSTECTOMY N/A 11/01/2021   Procedure: LAPAROSCOPIC CHOLECYSTECTOMY WITH INTRAOPERATIVE CHOLANGIOGRAM;  Surgeon: Stevie Herlene Righter, MD;  Location: [facility];  Service: General;  Laterality: N/A;   DILATION AND CURETTAGE OF UTERUS  2016    SAB   KNEE ARTHROSCOPY Right    reconstruction surgery   KNEE CARTILAGE SURGERY Left    KNEE CLOSED REDUCTION Left 03/07/2019   Procedure: CLOSED MANIPULATION LEFT  KNEE;  Surgeon: Jerri Kay HERO, MD;  Location: [facility];  Service: Orthopedics;  Laterality: Left;   LEG SURGERY Right    rods and screws   TOTAL KNEE ARTHROPLASTY Left 12/09/2018   TOTAL KNEE ARTHROPLASTY Left 12/09/2018   Procedure: LEFT TOTAL KNEE ARTHROPLASTY;  Surgeon: Jerri Kay HERO, MD;  Location: [facility] OR;  Service: Orthopedics;  Laterality: Left;   WISDOM TOOTH EXTRACTION     Social History   Occupational History   Not on file  Tobacco Use   Smoking status: Never   Smokeless tobacco: Never  Vaping Use   Vaping status: Never Used  Substance and Sexual Activity   Alcohol use: Never   Drug use: Never   Sexual activity: Not Currently        "

## 2024-03-07 ENCOUNTER — Ambulatory Visit: Admitting: Family Medicine
